# Patient Record
Sex: Female | Born: 1950 | Race: White | Hispanic: No | Smoking: Never smoker
Health system: Southern US, Community
[De-identification: ages and names within clinical notes are randomized; demographics above are authoritative.]

## PROBLEM LIST (undated history)

## (undated) DIAGNOSIS — M160 Bilateral primary osteoarthritis of hip: Secondary | ICD-10-CM

## (undated) DIAGNOSIS — T8859XA Other complications of anesthesia, initial encounter: Secondary | ICD-10-CM

## (undated) DIAGNOSIS — M51369 Other intervertebral disc degeneration, lumbar region without mention of lumbar back pain or lower extremity pain: Secondary | ICD-10-CM

## (undated) DIAGNOSIS — M5136 Other intervertebral disc degeneration, lumbar region: Secondary | ICD-10-CM

## (undated) DIAGNOSIS — F419 Anxiety disorder, unspecified: Secondary | ICD-10-CM

## (undated) DIAGNOSIS — D649 Anemia, unspecified: Secondary | ICD-10-CM

## (undated) DIAGNOSIS — G4730 Sleep apnea, unspecified: Secondary | ICD-10-CM

## (undated) DIAGNOSIS — M1990 Unspecified osteoarthritis, unspecified site: Secondary | ICD-10-CM

## (undated) DIAGNOSIS — T4145XA Adverse effect of unspecified anesthetic, initial encounter: Secondary | ICD-10-CM

## (undated) DIAGNOSIS — K219 Gastro-esophageal reflux disease without esophagitis: Secondary | ICD-10-CM

## (undated) DIAGNOSIS — T753XXA Motion sickness, initial encounter: Secondary | ICD-10-CM

## (undated) DIAGNOSIS — J45909 Unspecified asthma, uncomplicated: Secondary | ICD-10-CM

## (undated) DIAGNOSIS — I10 Essential (primary) hypertension: Secondary | ICD-10-CM

---
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
## 2011-04-24 HISTORY — PX: SHOULDER ARTHROSCOPY WITH ROTATOR CUFF REPAIR: SHX5685

## 2014-04-23 HISTORY — PX: COLONOSCOPY: SHX174

## 2017-04-23 DIAGNOSIS — M5136 Other intervertebral disc degeneration, lumbar region: Secondary | ICD-10-CM | POA: Insufficient documentation

## 2017-10-21 DIAGNOSIS — M160 Bilateral primary osteoarthritis of hip: Secondary | ICD-10-CM | POA: Insufficient documentation

## 2018-11-27 ENCOUNTER — Other Ambulatory Visit: Payer: Self-pay | Admitting: Orthopedic Surgery

## 2018-11-27 DIAGNOSIS — M25511 Pain in right shoulder: Secondary | ICD-10-CM

## 2018-12-10 ENCOUNTER — Other Ambulatory Visit: Payer: Self-pay

## 2018-12-10 ENCOUNTER — Ambulatory Visit
Admission: RE | Admit: 2018-12-10 | Discharge: 2018-12-10 | Disposition: A | Discharge status: Home / Self Care | Payer: Medicare HMO | Source: Ambulatory Visit | Attending: Orthopedic Surgery | Admitting: Orthopedic Surgery

## 2018-12-10 DIAGNOSIS — M25511 Pain in right shoulder: Secondary | ICD-10-CM | POA: Diagnosis not present

## 2018-12-10 IMAGING — MR MRI OF THE RIGHT SHOULDER WITHOUT CONTRAST
4 of 5 series · 31 of 40 positions shown · non-contrast
Comparison: None.

CLINICAL DATA: Right shoulder pain, limited range of motion

EXAM:
MRI OF THE RIGHT SHOULDER WITHOUT CONTRAST
TECHNIQUE: Multiplanar, multisequence MR imaging of the shoulder was performed.
No intravenous contrast was administered.

[Series 5: PD fat-sat · axial · right · 4.0mm · 0.55mm/px · z∈[-43,+87]mm · 8 of 28 slices shown (1 of 2)]
[im 1/28]
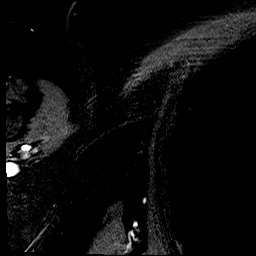
[im 4/28]
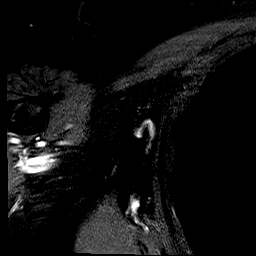
[im 10/28]
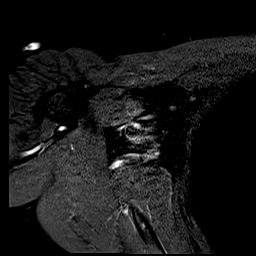
[im 13/28]
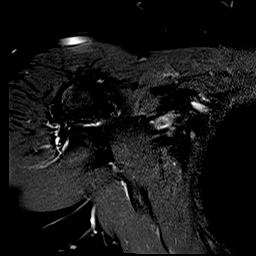
[im 16/28]
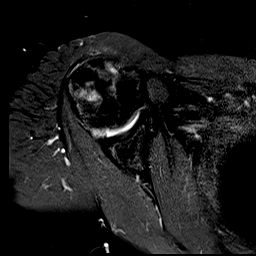
[im 19/28]
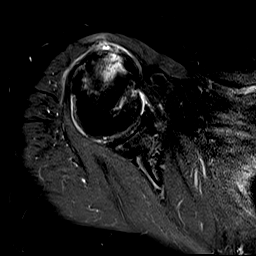
[im 25/28]
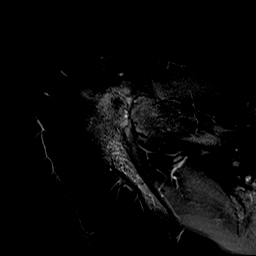
[im 28/28]
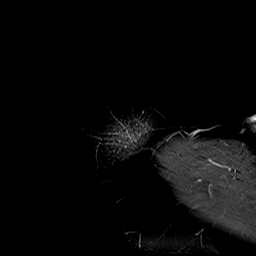

[Series 6: PD fat-sat · oblique · right · 4.0mm · 0.44mm/px · 8 of 26 slices shown (2 of 2)]
[im 1/26]
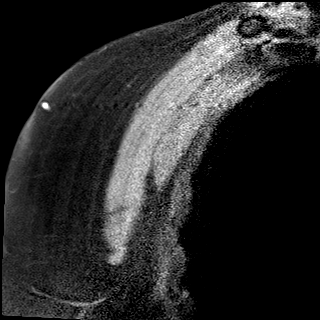
[im 4/26]
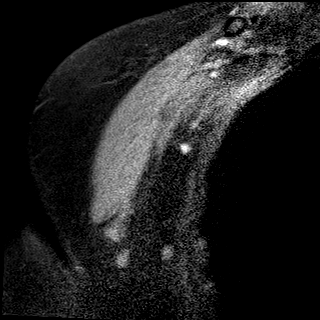
[im 8/26]
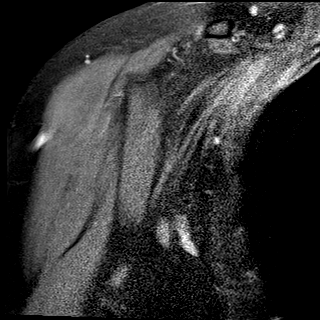
[im 11/26]
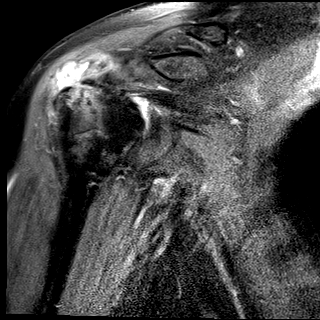
[im 15/26]
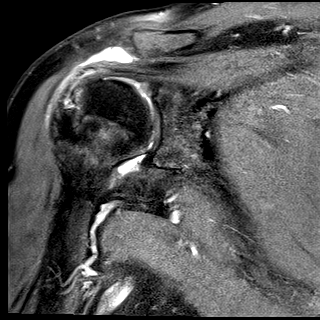
[im 18/26]
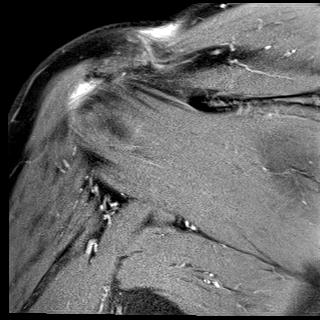
[im 22/26]
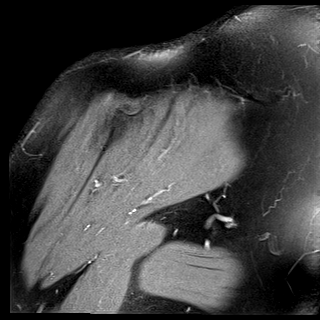
[im 26/26]
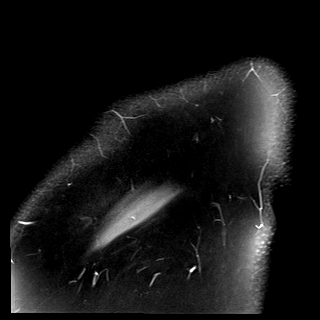

[Series 7: T2 fat-sat · oblique · right · 4.0mm · 0.44mm/px · 8 of 26 slices shown (1 of 2)]
[im 1/26]
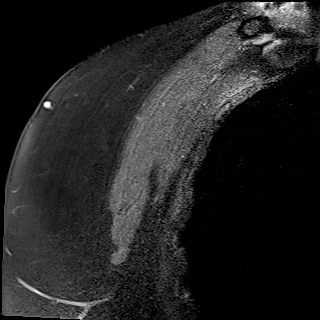
[im 4/26]
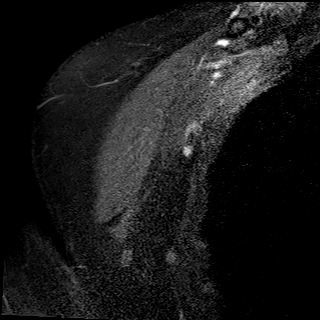
[im 8/26]
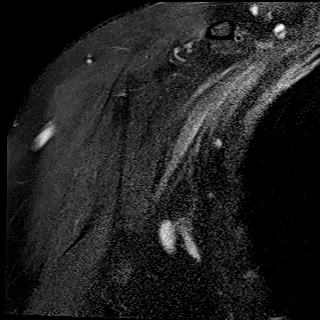
[im 11/26]
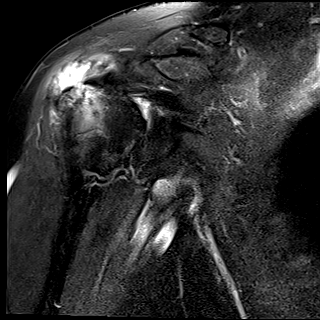
[im 15/26]
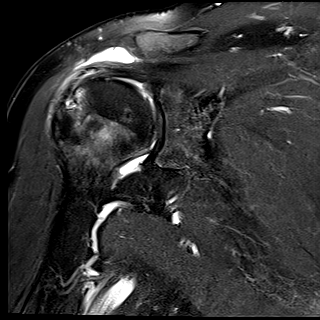
[im 18/26]
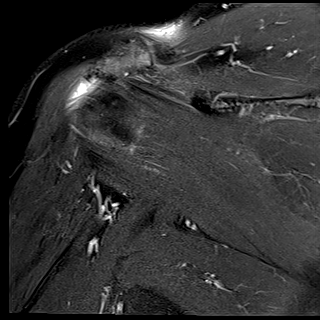
[im 22/26]
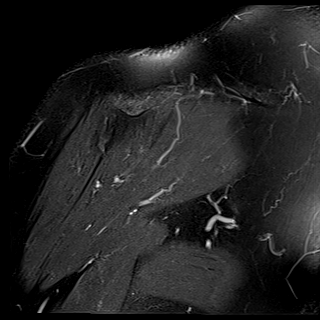
[im 26/26]
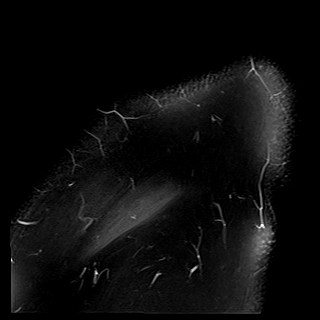

[Series 8: T2 fat-sat · oblique · right · 4.0mm · 0.23mm/px · 7 of 22 slices shown (2 of 2)]
[im 1/22]
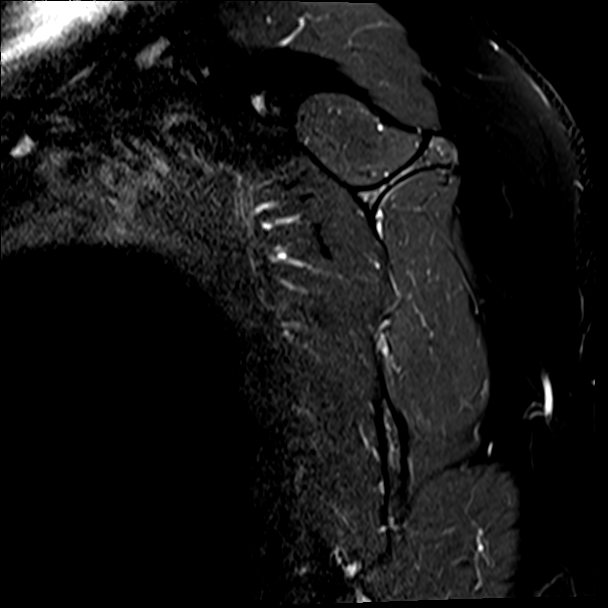
[im 4/22]
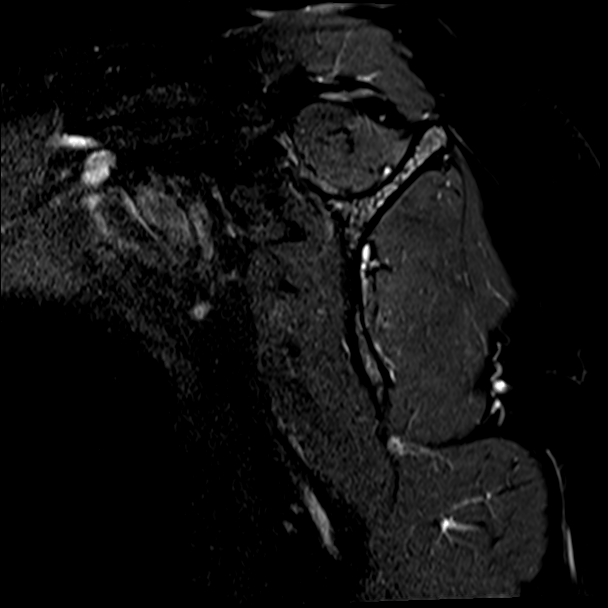
[im 8/22]
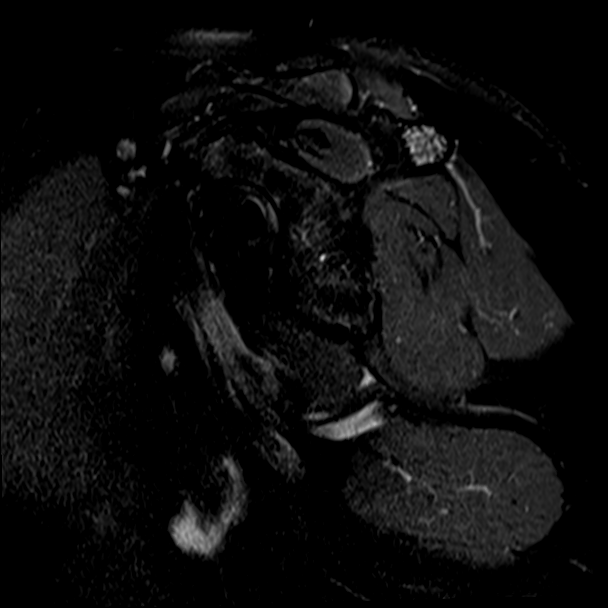
[im 11/22]
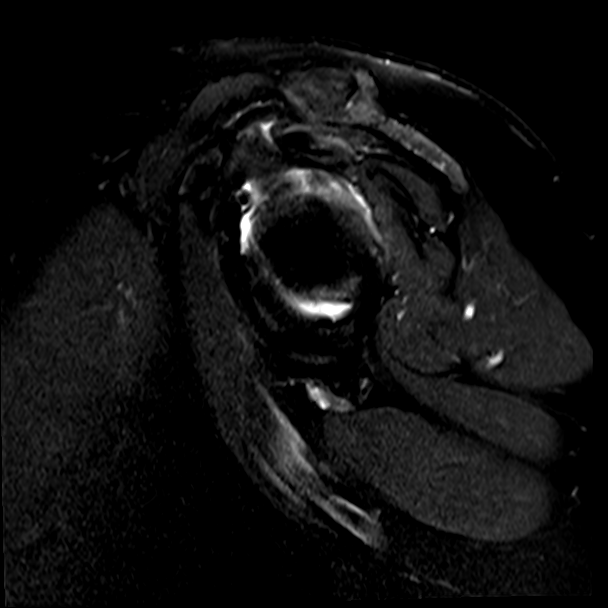
[im 15/22]
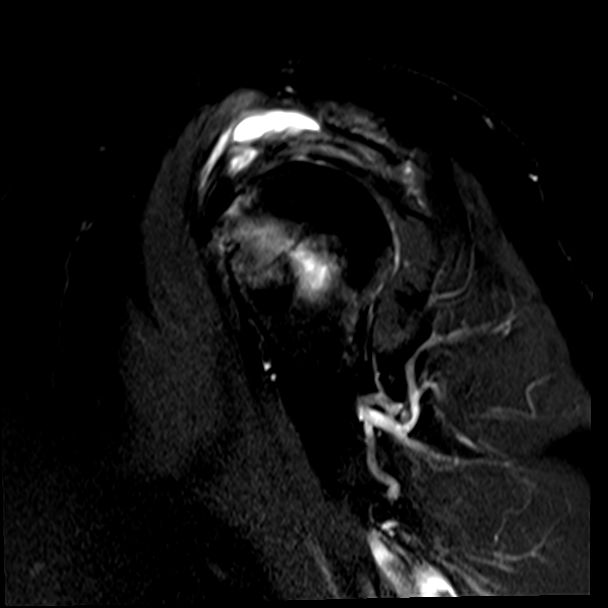
[im 18/22]
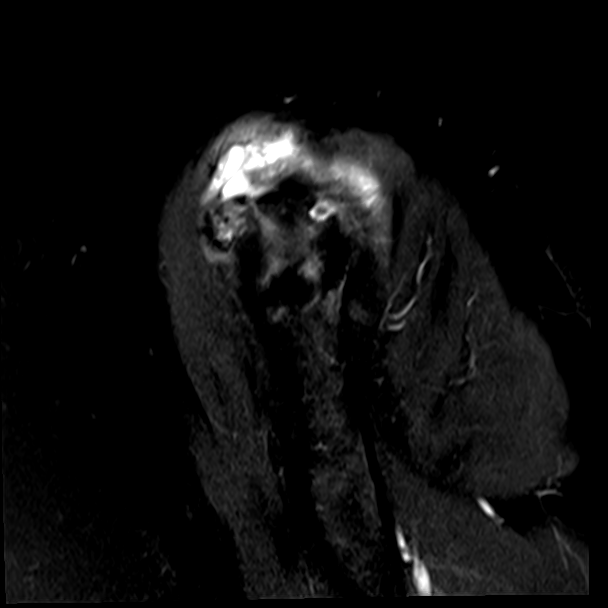
[im 22/22]
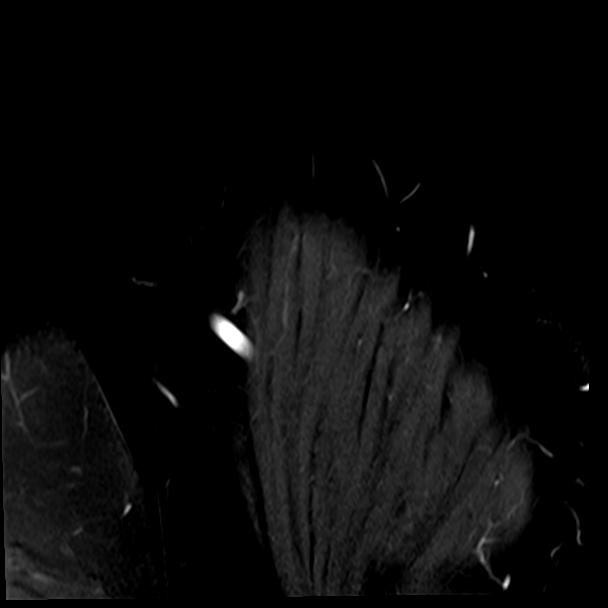

[31 of 40 positions shown; findings below may reference images not displayed]

FINDINGS: Rotator cuff: Full-thickness tear involving the anterior insertional
fibers of the supraspinatus tendon measuring approximately 6 mm in
AP dimension (series 7, image 16; series 8, image 18). There is a
background of supraspinatus tendinosis with additional low-grade
interstitial tears involving the more posterior fibers. Diffuse
infraspinatus tendinosis without discrete tear. Subscapularis
tendinosis with probable partial tearing of the more superior
fibers. Teres minor intact.

Muscles: No atrophy or abnormal signal of the muscles of the rotator
cuff.

Biceps long head: Not visualized, and may be chronically torn or
surgically released.

Acromioclavicular Joint: Mild arthropathy of the acromioclavicular
joint. Type II acromion. Small to moderate volume of fluid in the
subacromial-subdeltoid bursa which is heterogeneous and loculated
suggesting bursitis.

Glenohumeral Joint: No joint effusion. Small focus of low signal
intensity material is present at the axillary recess of the
glenohumeral joint, likely representing a loose chondral body.
Extensive cartilage thinning and surface irregularity with areas of
near full-thickness cartilage loss predominantly involving the
anteromedial aspect of the humeral head.

Labrum:  Circumferentially degenerated.

Bones: 2 surgical anchors are present in the greater tuberosity.
There is extensive marrow edema within the greater tuberosity.

Other: None.
IMPRESSION: 1. Rotator cuff tendinosis with full-thickness tear of the anterior
insertional fibers of the supraspinatus tendon.
2. Subacromial-subdeltoid bursitis.
3. Prominent bone marrow edema within the greater tuberosity at site
of prior tendon anchors. Findings may be reactive secondary to
rotator cuff tear and/or anchor loosening. A nondisplaced fracture
at this site would be difficult to exclude.
4. Moderate glenohumeral osteoarthritis with extensive humeral head
cartilage loss. Small intra-articular body in the glenohumeral joint
likely reflecting a loose chondral fragment.

## 2018-12-31 ENCOUNTER — Encounter: Payer: Self-pay | Admitting: *Deleted

## 2018-12-31 ENCOUNTER — Other Ambulatory Visit: Payer: Self-pay

## 2019-01-05 ENCOUNTER — Other Ambulatory Visit: Admission: RE | Admit: 2019-01-05 | Payer: Medicare HMO | Source: Ambulatory Visit

## 2019-01-06 ENCOUNTER — Other Ambulatory Visit: Payer: Self-pay

## 2019-01-06 ENCOUNTER — Other Ambulatory Visit
Admission: RE | Admit: 2019-01-06 | Discharge: 2019-01-06 | Disposition: A | Discharge status: Home / Self Care | Payer: Medicare HMO | Source: Ambulatory Visit | Attending: Orthopedic Surgery | Admitting: Orthopedic Surgery

## 2019-01-06 DIAGNOSIS — Z20828 Contact with and (suspected) exposure to other viral communicable diseases: Secondary | ICD-10-CM | POA: Insufficient documentation

## 2019-01-06 DIAGNOSIS — Z01812 Encounter for preprocedural laboratory examination: Secondary | ICD-10-CM | POA: Insufficient documentation

## 2019-01-06 LAB — SARS CORONAVIRUS 2 (TAT 6-24 HRS): SARS Coronavirus 2: NEGATIVE

## 2019-01-09 ENCOUNTER — Ambulatory Visit
Admission: RE | Admit: 2019-01-09 | Discharge: 2019-01-09 | Disposition: A | Discharge status: Home / Self Care | Payer: Medicare HMO | Attending: Orthopedic Surgery | Admitting: Orthopedic Surgery

## 2019-01-09 ENCOUNTER — Ambulatory Visit: Payer: Medicare HMO | Admitting: Anesthesiology

## 2019-01-09 ENCOUNTER — Encounter
Admission: RE | Disposition: A | Discharge status: Home / Self Care | Payer: Self-pay | Source: Home / Self Care | Attending: Orthopedic Surgery

## 2019-01-09 ENCOUNTER — Other Ambulatory Visit: Payer: Self-pay

## 2019-01-09 DIAGNOSIS — K219 Gastro-esophageal reflux disease without esophagitis: Secondary | ICD-10-CM | POA: Insufficient documentation

## 2019-01-09 DIAGNOSIS — Z8249 Family history of ischemic heart disease and other diseases of the circulatory system: Secondary | ICD-10-CM | POA: Diagnosis not present

## 2019-01-09 DIAGNOSIS — Z86010 Personal history of colonic polyps: Secondary | ICD-10-CM | POA: Diagnosis not present

## 2019-01-09 DIAGNOSIS — I10 Essential (primary) hypertension: Secondary | ICD-10-CM | POA: Insufficient documentation

## 2019-01-09 DIAGNOSIS — E669 Obesity, unspecified: Secondary | ICD-10-CM | POA: Diagnosis not present

## 2019-01-09 DIAGNOSIS — J4520 Mild intermittent asthma, uncomplicated: Secondary | ICD-10-CM | POA: Insufficient documentation

## 2019-01-09 DIAGNOSIS — Z8261 Family history of arthritis: Secondary | ICD-10-CM | POA: Diagnosis not present

## 2019-01-09 DIAGNOSIS — M7541 Impingement syndrome of right shoulder: Secondary | ICD-10-CM | POA: Insufficient documentation

## 2019-01-09 DIAGNOSIS — G4730 Sleep apnea, unspecified: Secondary | ICD-10-CM | POA: Insufficient documentation

## 2019-01-09 DIAGNOSIS — Z6832 Body mass index (BMI) 32.0-32.9, adult: Secondary | ICD-10-CM | POA: Diagnosis not present

## 2019-01-09 DIAGNOSIS — M160 Bilateral primary osteoarthritis of hip: Secondary | ICD-10-CM | POA: Insufficient documentation

## 2019-01-09 DIAGNOSIS — G5702 Lesion of sciatic nerve, left lower limb: Secondary | ICD-10-CM | POA: Insufficient documentation

## 2019-01-09 DIAGNOSIS — Z79899 Other long term (current) drug therapy: Secondary | ICD-10-CM | POA: Insufficient documentation

## 2019-01-09 DIAGNOSIS — Z833 Family history of diabetes mellitus: Secondary | ICD-10-CM | POA: Diagnosis not present

## 2019-01-09 DIAGNOSIS — M75101 Unspecified rotator cuff tear or rupture of right shoulder, not specified as traumatic: Secondary | ICD-10-CM | POA: Diagnosis not present

## 2019-01-09 DIAGNOSIS — Z803 Family history of malignant neoplasm of breast: Secondary | ICD-10-CM | POA: Insufficient documentation

## 2019-01-09 DIAGNOSIS — Z8349 Family history of other endocrine, nutritional and metabolic diseases: Secondary | ICD-10-CM | POA: Diagnosis not present

## 2019-01-09 HISTORY — DX: Sleep apnea, unspecified: G47.30

## 2019-01-09 HISTORY — DX: Motion sickness, initial encounter: T75.3XXA

## 2019-01-09 HISTORY — PX: SHOULDER ARTHROSCOPY WITH SUBACROMIAL DECOMPRESSION: SHX5684

## 2019-01-09 HISTORY — DX: Other complications of anesthesia, initial encounter: T88.59XA

## 2019-01-09 HISTORY — DX: Other intervertebral disc degeneration, lumbar region without mention of lumbar back pain or lower extremity pain: M51.369

## 2019-01-09 HISTORY — DX: Gastro-esophageal reflux disease without esophagitis: K21.9

## 2019-01-09 HISTORY — DX: Unspecified osteoarthritis, unspecified site: M19.90

## 2019-01-09 HISTORY — DX: Other intervertebral disc degeneration, lumbar region: M51.36

## 2019-01-09 HISTORY — DX: Essential (primary) hypertension: I10

## 2019-01-09 SURGERY — SHOULDER ARTHROSCOPY WITH SUBACROMIAL DECOMPRESSION AND DISTAL CLAVICLE EXCISION
Anesthesia: Regional | Site: Shoulder | Laterality: Right

## 2019-01-09 MED ORDER — FENTANYL CITRATE (PF) 100 MCG/2ML IJ SOLN
INTRAMUSCULAR | Status: DC | PRN
  Administered 2019-01-09: 50 ug via INTRAVENOUS

## 2019-01-09 MED ORDER — DEXTROSE 5 % IV SOLN
2000.0000 mg | Freq: Once | INTRAVENOUS | Status: AC
  Administered 2019-01-09: 08:00:00 2 g via INTRAVENOUS

## 2019-01-09 MED ORDER — MIDAZOLAM HCL 5 MG/5ML IJ SOLN
INTRAMUSCULAR | Status: DC | PRN
  Administered 2019-01-09: 2 mg via INTRAVENOUS

## 2019-01-09 MED ORDER — ASPIRIN EC 325 MG PO TBEC: 325.0000 mg | DELAYED_RELEASE_TABLET | Freq: Every day | ORAL | 0 refills | Status: AC

## 2019-01-09 MED ORDER — ONDANSETRON 4 MG PO TBDP: 4.0000 mg | ORAL_TABLET | Freq: Three times a day (TID) | ORAL | 0 refills | Status: DC | PRN

## 2019-01-09 MED ORDER — OXYCODONE HCL 5 MG PO TABS: 5.0000 mg | ORAL_TABLET | ORAL | 0 refills | Status: DC | PRN

## 2019-01-09 MED ORDER — GLYCOPYRROLATE 0.2 MG/ML IJ SOLN
INTRAMUSCULAR | Status: DC | PRN
  Administered 2019-01-09: 0.1 mg via INTRAVENOUS

## 2019-01-09 MED ORDER — EPHEDRINE SULFATE 50 MG/ML IJ SOLN
INTRAMUSCULAR | Status: DC | PRN
  Administered 2019-01-09: 10 mg via INTRAVENOUS

## 2019-01-09 MED ORDER — ACETAMINOPHEN 500 MG PO TABS: 1000.0000 mg | ORAL_TABLET | Freq: Three times a day (TID) | ORAL | 2 refills | Status: AC

## 2019-01-09 MED ORDER — PROPOFOL 10 MG/ML IV BOLUS
INTRAVENOUS | Status: DC | PRN
  Administered 2019-01-09: 130 mg via INTRAVENOUS

## 2019-01-09 MED ORDER — BUPIVACAINE LIPOSOME 1.3 % IJ SUSP
INTRAMUSCULAR | Status: DC | PRN
  Administered 2019-01-09: 20 mL via PERINEURAL

## 2019-01-09 MED ORDER — DEXAMETHASONE SODIUM PHOSPHATE 4 MG/ML IJ SOLN
INTRAMUSCULAR | Status: DC | PRN
  Administered 2019-01-09: 4 mg via INTRAVENOUS

## 2019-01-09 MED ORDER — LACTATED RINGERS IV SOLN
INTRAVENOUS | Status: DC | PRN
  Administered 2019-01-09: 4 mL

## 2019-01-09 MED ORDER — LACTATED RINGERS IV SOLN
INTRAVENOUS | Status: DC
  Administered 2019-01-09: 07:00:00 via INTRAVENOUS

## 2019-01-09 MED ORDER — LIDOCAINE HCL (CARDIAC) PF 100 MG/5ML IV SOSY
PREFILLED_SYRINGE | INTRAVENOUS | Status: DC | PRN
  Administered 2019-01-09: 40 mg via INTRATRACHEAL

## 2019-01-09 MED ORDER — BUPIVACAINE HCL (PF) 0.5 % IJ SOLN
INTRAMUSCULAR | Status: DC | PRN
  Administered 2019-01-09: 20 mL

## 2019-01-09 SURGICAL SUPPLY — 58 items
ADAPTER IRRIG TUBE 2 SPIKE SOL (ADAPTER) ×4 IMPLANT
ANCHOR BONE REGENETEN (Anchor) ×1 IMPLANT
ANCHOR HEALICOIL REGEN 5.5 (Anchor) ×2 IMPLANT
ANCHOR SUT 4.75 HEALICOIL REGE (Anchor) ×2 IMPLANT
ANCHOR TENDON REGENETEN (Staple) ×1 IMPLANT
BUR BR 5.5 12 FLUTE (BURR) ×2 IMPLANT
BUR RADIUS 4.0X18.5 (BURR) ×2 IMPLANT
CANNULA PART THRD DISP 5.75X7 (CANNULA) ×2 IMPLANT
CHLORAPREP W/TINT 26 (MISCELLANEOUS) ×2 IMPLANT
COOLER POLAR GLACIER W/PUMP (MISCELLANEOUS) ×2 IMPLANT
COVER LIGHT HANDLE UNIVERSAL (MISCELLANEOUS) ×4 IMPLANT
DERMABOND ADVANCED (GAUZE/BANDAGES/DRESSINGS) ×1
DERMABOND ADVANCED .7 DNX12 (GAUZE/BANDAGES/DRESSINGS) ×1 IMPLANT
DRAPE IMP U-DRAPE 54X76 (DRAPES) ×4 IMPLANT
DRAPE INCISE IOBAN 66X45 STRL (DRAPES) ×2 IMPLANT
DRAPE SHEET LG 3/4 BI-LAMINATE (DRAPES) ×2 IMPLANT
DRAPE U-SHAPE 48X52 POLY STRL (PACKS) ×4 IMPLANT
DRSG TEGADERM 4X4.75 (GAUZE/BANDAGES/DRESSINGS) ×6 IMPLANT
ELECT REM PT RETURN 9FT ADLT (ELECTROSURGICAL) ×2
ELECTRODE REM PT RTRN 9FT ADLT (ELECTROSURGICAL) ×1 IMPLANT
GAUZE XEROFORM 1X8 LF (GAUZE/BANDAGES/DRESSINGS) ×3 IMPLANT
GLOVE BIOGEL PI IND STRL 8 (GLOVE) ×1 IMPLANT
GLOVE BIOGEL PI INDICATOR 8 (GLOVE) ×3
GLOVE PI ULTRA LF STRL 7.5 (GLOVE) IMPLANT
GLOVE PI ULTRA NON LATEX 7.5 (GLOVE) ×3
GOWN STRL REIN 2XL XLG LVL4 (GOWN DISPOSABLE) ×2 IMPLANT
GOWN STRL REUS W/ TWL LRG LVL3 (GOWN DISPOSABLE) ×1 IMPLANT
GOWN STRL REUS W/TWL LRG LVL3 (GOWN DISPOSABLE) ×2
IMPL REGENETEN MEDIUM (Shoulder) IMPLANT
IMPLANT REGENETEN MEDIUM (Shoulder) ×2 IMPLANT
IV LACTATED RINGER IRRG 3000ML (IV SOLUTION) ×4
IV LR IRRIG 3000ML ARTHROMATIC (IV SOLUTION) ×8 IMPLANT
KIT STABILIZATION SHOULDER (MISCELLANEOUS) ×2 IMPLANT
KIT TURNOVER KIT A (KITS) ×2 IMPLANT
MANIFOLD NEPTUNE II (INSTRUMENTS) ×2 IMPLANT
MASK FACE SPIDER DISP (MASK) ×2 IMPLANT
MAT GRAY ABSORB FLUID 28X50 (MISCELLANEOUS) ×4 IMPLANT
NDL SAFETY ECLIPSE 18X1.5 (NEEDLE) ×1 IMPLANT
NDL SCORPION MULTI FIRE (NEEDLE) IMPLANT
NEEDLE HYPO 18GX1.5 SHARP (NEEDLE) ×1
NEEDLE SCORPION MULTI FIRE (NEEDLE) ×2 IMPLANT
PACK ARTHROSCOPY SHOULDER (MISCELLANEOUS) ×2 IMPLANT
PAD WRAPON POLAR SHDR UNIV (MISCELLANEOUS) ×1 IMPLANT
PENCIL SMOKE EVACUATOR (MISCELLANEOUS) ×1 IMPLANT
SET TUBE SUCT SHAVER OUTFL 24K (TUBING) ×2 IMPLANT
SET TUBE TIP INTRA-ARTICULAR (MISCELLANEOUS) ×2 IMPLANT
SPONGE GAUZE 2X2 8PLY STRL LF (GAUZE/BANDAGES/DRESSINGS) ×9 IMPLANT
SUT ETHILON 3-0 FS-10 30 BLK (SUTURE) ×2
SUT MNCRL 4-0 (SUTURE) ×1
SUT MNCRL 4-0 27XMFL (SUTURE) ×1
SUT VIC AB 0 CT1 36 (SUTURE) ×2 IMPLANT
SUT VIC AB 2-0 CT2 27 (SUTURE) ×2 IMPLANT
SUTURE EHLN 3-0 FS-10 30 BLK (SUTURE) ×1 IMPLANT
SUTURE MNCRL 4-0 27XMF (SUTURE) ×1 IMPLANT
SYR 10ML LL (SYRINGE) ×2 IMPLANT
TUBING ARTHRO INFLOW-ONLY STRL (TUBING) ×2 IMPLANT
WAND WEREWOLF FLOW 90D (MISCELLANEOUS) ×2 IMPLANT
WRAPON POLAR PAD SHDR UNIV (MISCELLANEOUS) ×2

## 2019-01-09 NOTE — Op Note (Signed)
SURGERY DATE: 01/09/2019  PRE-OP DIAGNOSIS:  1. Right subacromial impingement 2. Right rotator cuff tear  POST-OP DIAGNOSIS: 1. Right subacromial impingement 2. Right rotator cuff tear  PROCEDURES:  1. Right mini-open rotator cuff repair 2. Right arthroscopic extensive debridement of shoulder (glenohumeral and subacromial spaces) 3. Right arthroscopic subacromial decompression  SURGEON: Rosealee AlbeeSunny H. Devika Dragovich, MD  ASSISTANT: Sonny DandyJ. Todd Mundy, PA  ANESTHESIA: Gen with Exparil interscalene block  ESTIMATED BLOOD LOSS: 25cc  DRAINS:  none  TOTAL IV FLUIDS: per anesthesia   SPECIMENS: none  IMPLANTS:  -[employer] Healicoil anchors double loaded with tape and suture x 2  -[employer] Healicoil Knotless Lateral Row Anchor x 2 -[employer] Regeneten Patch with associated tendon and bone staples   OPERATIVE FINDINGS:  Examination under anesthesia: A careful examination under anesthesia was performed.  Passive range of motion was: FF: 150; ER at side: 45; ER in abduction: 90; IR in abduction: 50.  Anterior load shift: NT.  Posterior load shift: NT.  Sulcus in neutral: NT.  Sulcus in ER: NT.    Intra-operative findings: A thorough arthroscopic examination of the shoulder was performed.  The findings are: 1. Biceps tendon: Not visualized within the joint 2. Superior labrum: injected with surrounding synovitis 3. Posterior labrum and capsule: normal 4. Inferior capsule and inferior recess: normal, no loose body noted in this region or anywhere else about the shoulder joint 5. Glenoid cartilage surface: Focal areas of grade 2 degenerative changes centrally and anteriorly 6. Supraspinatus attachment: full-thickness tear anteriorly with high-grade partial-thickness tear surrounding this region 7. Posterior rotator cuff attachment: normal 8. Humeral head articular cartilage: Focal areas of grade 4 degenerative changes with diffuse grade 1-2 degenerative changes 9. Rotator interval:  significant synovitis 10: Subscapularis tendon: attachment intact 11. Anterior labrum: degenerative 12. IGHL: normal  OPERATIVE REPORT:   Indications for procedure: Sheila MoronJoanne Hallisey is a 68 y.o. female with approximately 3 months of right shoulder pain that began after she pushed herself out of the bed.  Prior to this event she did not have any significant pain in her shoulder.  Of note, she has had a prior mini open rotator cuff surgery approximately 10 years ago.  She had done well from that surgery since that time until recently. Clinical exam and MRI were suggestive of rotator cuff tear and subacromial impingement. After discussion of risks, benefits, and alternatives to surgery, the patient elected to proceed.  Given the full-thickness nature of the tear and traumatic event with no antecedent shoulder pain, we elected to proceed with surgical management directly prior to further conservative measures as the patient would likely have a better result with surgery.  Procedure in detail:  I identified Sheila Rivas in the pre-operative holding area.  I marked the operative shoulder with my initials. I reviewed the risks and benefits of the proposed surgical intervention, and the patient (and/or patient's guardian) wished to proceed.  Anesthesia was then performed with an Exparil interscalene block.  The patient was transferred to the operative suite and placed in the beach chair position.    SCDs were placed on the lower extremities. Appropriate IV antibiotics were administered prior to incision. The operative upper extremity was then prepped and draped in standard fashion. A time out was performed confirming the correct extremity, correct patient, and correct procedure.   I then created a standard posterior portal with an 11 blade. The glenohumeral joint was easily entered with a blunt trochar and the arthroscope introduced. The findings  of diagnostic arthroscopy are described above. I debrided  degenerative tissue including the synovitic tissue about the rotator interval and anterior and superior labrum. I then coagulated the inflamed synovium to obtain hemostasis and reduce the risk of post-operative swelling using an Arthrocare radiofrequency device.  There is no notable loose body within the shoulder joint.  Next, the arthroscope was then introduced into the subacromial space. A direct lateral portal was created with an 11-blade after spinal needle localization. An extensive subacromial bursectomy was performed using a combination of the shaver and Arthrocare wand. The entire acromial undersurface was exposed and the CA ligament was subperiosteally elevated to expose the anterior acromial hook. A 5.87mm barrel burr was used to create a flat anterior and lateral aspect of the acromion, converting it from a Type 2 to a Type 1 acromion. Care was made to keep the deltoid fascia intact.  The previously utilized mini open incision was utilized to approach the rotator cuff repair.  A longitudinal incision from the anterolateral acromion ~7cm in length was made overlying the raphe between the anterior and middle heads of the deltoid. The raphe was identified and it was incised. The subacromial space was identified.  There was significant scar tissue with adhesions between the bursa and the deltoid anteriorly.  These were removed.  Any remaining bursa was excised. The rotator cuff tear was identified. It was a U-shaped tear along the anterior supraspinatus.  There was significant surrounding areas of high-grade partial-thickness tear of the bursal side.  Given the appearance of this degenerative tissue, a 15 blade was used to excise the areas of degenerative partial-thickness tearing and the tear was completed such that it was a full-thickness U-shaped tear.  The rotator cuff footprint was cleared of soft tissue. A rongeur was used to gently decorticate the rotator cuff footprint to allow for improved  healing. Two Healicoil anchors were placed just lateral to the articular margin.  The rotator cuff was mobilized using key elevators both superior and inferior to the tear.  There were significant adhesions superiorly that were released.  The rotator cuff was able to be almost completely reduced to its footprint and then held in a reduced position with graspers. All 8 strands of suture from the medial row anchors were passed through the rotator cuff in an appropriate fashion. Two HealiCoil knotless anchors were placed for the lateral row anchors with the anterior sutures passed through the posterior lateral row anchor and the posterior sutures passed through the anterior lateral row anchor.  This allowed for the best reapproximation of the rotator cuff over its footprint, but there was a small region of the anterolateral footprint that was left uncovered.  Therefore we decided to proceed with Regeneten patch application.  The patch was deployed over this area and appropriate tendon and bone staples were used to secure the implant. This construct was stable with external and internal rotation.  The wound was thoroughly irrigated.  The deltoid split was closed with 0 Vicryl.  The subdermal layer was closed with 2-0 Vicryl.  The skin was closed with 4-0 Monocryl and Dermabond. The portals were closed with 3-0 Nylon. Xeroform was applied to the incisions. A sterile dressing was applied, followed by a Polar Care sleeve and a SlingShot shoulder immobilizer/sling. The patient was awakened from anesthesia without difficulty and was transferred to the PACU in stable condition.   Of note, assistance from a PA was essential to performing the surgery.  PA was present for the entire surgery.  PA assisted with patient positioning, retraction, instrumentation, and wound closure. The surgery would have been more difficult and had longer operative time without PA assistance.   Additionally, this case had increased complexity  compared to standard rotator cuff repair given the revision nature of the surgery.  There was increased adhesions and scar tissue that required additional dissection.  Additionally given that the tear did not completely cover the footprint Regeneten patch was applied to augment the repair.  These factors increased surgical time by approximately 30 minutes.  COMPLICATIONS: none  DISPOSITION: plan for discharge home after recovery in PACU   POSTOPERATIVE PLAN: Remain in sling (except hygiene and elbow/wrist/hand RoM exercises as instructed by PT) x 6 weeks and NWB for this time. PT to begin 3-4 days after surgery. Rotator cuff repair rehab protocol. ASA 325mg  daily x 2 weeks for DVT ppx.

## 2019-01-09 NOTE — Discharge Instructions (Signed)
Post-Op Instructions - Rotator Cuff Repair ° °1. Bracing: You will wear a shoulder immobilizer or sling for 6 weeks.  ° °2. Driving: No driving for 3 weeks post-op. When driving, do not wear the immobilizer. Ideally, we recommend no driving for 6 weeks while sling is in place as one arm will be immobilized.  ° °3. Activity: No active lifting for 2 months. Wrist, hand, and elbow motion only. Avoid lifting the upper arm away from the body except for hygiene. You are permitted to bend and straighten the elbow passively only (no active elbow motion). You may use your hand and wrist for typing, writing, and managing utensils (cutting food). Do not lift more than a coffee cup for 8 weeks.  When sleeping or resting, inclined positions (recliner chair or wedge pillow) and a pillow under the forearm for support may provide better comfort for up to 4 weeks.  Avoid long distance travel for 4 weeks. ° °Return to normal activities after rotator cuff repair repair normally takes 6 months on average. If rehab goes very well, may be able to do most activities at 4 months, except overhead or contact sports. ° °4. Physical Therapy: Begins 3-4 days after surgery, and proceed 1 time per week for the first 6 weeks, then 1-2 times per week from weeks 6-20 post-op. ° °5. Medications:  °- You will be provided a prescription for narcotic pain medicine. After surgery, take 1-2 narcotic tablets every 4 hours if needed for severe pain.  °- A prescription for anti-nausea medication will be provided in case the narcotic medicine causes nausea - take 1 tablet every 6 hours only if nauseated.   °- Take tylenol 1000 mg (2 Extra Strength tablets or 3 regular strength) every 8 hours for pain.  May decrease or stop tylenol 5 days after surgery if you are having minimal pain. °- Take ASA 325mg/day x 2 weeks to help prevent DVTs/PEs (blood clots).  °- DO NOT take ANY nonsteroidal anti-inflammatory pain medications (Advil, Motrin, Ibuprofen, Aleve,  Naproxen, or Naprosyn). These medicines can inhibit healing of your shoulder repair.  ° ° °If you are taking prescription medication for anxiety, depression, insomnia, muscle spasm, chronic pain, or for attention deficit disorder, you are advised that you are at a higher risk of adverse effects with use of narcotics post-op, including narcotic addiction/dependence, depressed breathing, death. °If you use non-prescribed substances: alcohol, marijuana, cocaine, heroin, methamphetamines, etc., you are at a higher risk of adverse effects with use of narcotics post-op, including narcotic addiction/dependence, depressed breathing, death. °You are advised that taking > 50 morphine milligram equivalents (MME) of narcotic pain medication per day results in twice the risk of overdose or death. For your prescription provided: oxycodone 5 mg - taking more than 6 tablets per day would result in > 50 morphine milligram equivalents (MME) of narcotic pain medication. °Be advised that we will prescribe narcotics short-term, for acute post-operative pain only - 3 weeks for major operations such as shoulder repair/reconstruction surgeries.  ° ° ° °6. Post-Op Appointment: ° °Your first post-op appointment will be 10-14 days post-op. ° °7. Work or School: For most, but not all procedures, we advise staying out of work or school for at least 1 to 2 weeks in order to recover from the stress of surgery and to allow time for healing.  ° °If you need a work or school note this can be provided.  ° °8. Smoking: If you are a smoker, you need to refrain from   smoking in the postoperative period. The nicotine in cigarettes will inhibit healing of your shoulder repair and decrease the chance of successful repair. Similarly, nicotine containing products (gum, patches) should be avoided.  ° °Post-operative Brace: °Apply and remove the brace you received as you were instructed to at the time of fitting and as described in detail as the brace’s  instructions for use indicate.  Wear the brace for the period of time prescribed by your physician.  The brace can be cleaned with soap and water and allowed to air dry only.  Should the brace result in increased pain, decreased feeling (numbness/tingling), increased swelling or an overall worsening of your medical condition, please contact your doctor immediately.  If an emergency situation occurs as a result of wearing the brace after normal business hours, please dial 911 and seek immediate medical attention.  Let your doctor know if you have any further questions about the brace issued to you. °Refer to the shoulder sling instructions for use if you have any questions regarding the correct fit of your shoulder sling.  °[employer] Customer Care for Troubleshooting: [phone] ° °Video that illustrates how to properly use a shoulder sling: °"Instructions for Proper Use of an Orthopaedic Sling" °https://www.youtube.com/watch?v=AHZpn_Xo45w ° ° °Information for Discharge Teaching: °EXPAREL (bupivacaine liposome injectable suspension)  ° °Your surgeon or anesthesiologist gave you EXPAREL(bupivacaine) to help control your pain after surgery.  °· EXPAREL is a local anesthetic that provides pain relief by numbing the tissue around the surgical site. °· EXPAREL is designed to release pain medication over time and can control pain for up to 72 hours. °· Depending on how you respond to EXPAREL, you may require less pain medication during your recovery. ° °Possible side effects: °· Temporary loss of sensation or ability to move in the area where bupivacaine was injected. °· Nausea, vomiting, constipation °· Rarely, numbness and tingling in your mouth or lips, lightheadedness, or anxiety may occur. °· Call your doctor right away if you think you may be experiencing any of these sensations, or if you have other questions regarding possible side effects. ° °Follow all other discharge instructions given to you by your surgeon or  nurse. Eat a healthy diet and drink plenty of water or other fluids. ° °If you return to the hospital for any reason within 96 hours following the administration of EXPAREL, it is important for health care providers to know that you have received this anesthetic. A teal colored band has been placed on your arm with the date, time and amount of EXPAREL you have received in order to alert and inform your health care providers. Please leave this armband in place for the full 96 hours following administration, and then you may remove the band. ° ° ° °General Anesthesia, Adult, Care After °This sheet gives you information about how to care for yourself after your procedure. Your health care provider may also give you more specific instructions. If you have problems or questions, contact your health care provider. °What can I expect after the procedure? °After the procedure, the following side effects are common: °· Pain or discomfort at the IV site. °· Nausea. °· Vomiting. °· Sore throat. °· Trouble concentrating. °· Feeling cold or chills. °· Weak or tired. °· Sleepiness and fatigue. °· Soreness and body aches. These side effects can affect parts of the body that were not involved in surgery. °Follow these instructions at home: ° °For at least 24 hours after the procedure: °· Have a responsible adult   stay with you. It is important to have someone help care for you until you are awake and alert. °· Rest as needed. °· Do not: °? Participate in activities in which you could fall or become injured. °? Drive. °? Use heavy machinery. °? Drink alcohol. °? Take sleeping pills or medicines that cause drowsiness. °? Make important decisions or sign legal documents. °? Take care of children on your own. °Eating and drinking °· Follow any instructions from your health care provider about eating or drinking restrictions. °· When you feel hungry, start by eating small amounts of foods that are soft and easy to digest (bland), such as  toast. Gradually return to your regular diet. °· Drink enough fluid to keep your urine pale yellow. °· If you vomit, rehydrate by drinking water, juice, or clear broth. °General instructions °· If you have sleep apnea, surgery and certain medicines can increase your risk for breathing problems. Follow instructions from your health care provider about wearing your sleep device: °? Anytime you are sleeping, including during daytime naps. °? While taking prescription pain medicines, sleeping medicines, or medicines that make you drowsy. °· Return to your normal activities as told by your health care provider. Ask your health care provider what activities are safe for you. °· Take over-the-counter and prescription medicines only as told by your health care provider. °· If you smoke, do not smoke without supervision. °· Keep all follow-up visits as told by your health care provider. This is important. °Contact a health care provider if: °· You have nausea or vomiting that does not get better with medicine. °· You cannot eat or drink without vomiting. °· You have pain that does not get better with medicine. °· You are unable to pass urine. °· You develop a skin rash. °· You have a fever. °· You have redness around your IV site that gets worse. °Get help right away if: °· You have difficulty breathing. °· You have chest pain. °· You have blood in your urine or stool, or you vomit blood. °Summary °· After the procedure, it is common to have a sore throat or nausea. It is also common to feel tired. °· Have a responsible adult stay with you for the first 24 hours after general anesthesia. It is important to have someone help care for you until you are awake and alert. °· When you feel hungry, start by eating small amounts of foods that are soft and easy to digest (bland), such as toast. Gradually return to your regular diet. °· Drink enough fluid to keep your urine pale yellow. °· Return to your normal activities as told by  your health care provider. Ask your health care provider what activities are safe for you. °This information is not intended to replace advice given to you by your health care provider. Make sure you discuss any questions you have with your health care provider. °Document Released: 07/16/2000 Document Revised: 04/12/2017 Document Reviewed: 11/23/2016 °[employer]vier Patient Education © 2020 [employer] ° °

## 2019-01-09 NOTE — Transfer of Care (Signed)
Immediate Anesthesia Transfer of Care Note  Patient: Sheila Rivas  Procedure(s) Performed: Right shoulder arthroscopic subscapularis repair, mini-open rotator cuff repair, distal clavicleexcision, subacromial decompression, and loose bocy removal mac/reg w/interscalene block w/exparell (Right Shoulder)  Patient Location: PACU  Anesthesia Type: General, Regional  Level of Consciousness: awake, alert  and patient cooperative  Airway and Oxygen Therapy: Patient Spontanous Breathing and Patient connected to supplemental oxygen  Post-op Assessment: Post-op Vital signs reviewed, Patient's Cardiovascular Status Stable, Respiratory Function Stable, Patent Airway and No signs of Nausea or vomiting  Post-op Vital Signs: Reviewed and stable  Complications: No apparent anesthesia complications

## 2019-01-09 NOTE — Anesthesia Procedure Notes (Signed)
Procedure Name: LMA Insertion Date/Time: 01/09/2019 7:40 AM Performed by: Mayme Genta, CRNA Pre-anesthesia Checklist: Patient identified, Emergency Drugs available, Suction available, Timeout performed and Patient being monitored Patient Re-evaluated:Patient Re-evaluated prior to induction Oxygen Delivery Method: Circle system utilized Preoxygenation: Pre-oxygenation with 100% oxygen Induction Type: IV induction LMA: LMA inserted LMA Size: 3.0 Number of attempts: 1 Placement Confirmation: positive ETCO2 and breath sounds checked- equal and bilateral Tube secured with: Tape

## 2019-01-09 NOTE — Anesthesia Procedure Notes (Signed)
Anesthesia Regional Block: Interscalene brachial plexus block   Pre-Anesthetic Checklist: ,, timeout performed, Correct Patient, Correct Site, Correct Laterality, Correct Procedure, Correct Position, site marked, Risks and benefits discussed,  Surgical consent,  Pre-op evaluation,  At surgeon's request and post-op pain management  Laterality: Left  Prep: chloraprep       Needles:  Injection technique: Single-shot  Needle Type: Stimiplex     Needle Length: 10cm  Needle Gauge: 21     Additional Needles:   Procedures:,,,, ultrasound used (permanent image in chart),,,,  Narrative:  Start time: 01/09/2019 7:11 AM End time: 01/09/2019 7:19 AM Injection made incrementally with aspirations every 5 mL.  Performed by: Personally   Additional Notes: Functioning IV was confirmed and monitors applied. Ultrasound guidance: relevant anatomy identified, needle position confirmed, local anesthetic spread visualized around nerve(s)., vascular puncture avoided.  Image printed for medical record.  Negative aspiration and no paresthesias; incremental administration of local anesthetic. The patient tolerated the procedure well. Vitals signes recorded in RN notes.

## 2019-01-09 NOTE — H&P (Signed)
Paper H&P to be scanned into permanent record. H&P reviewed. No significant changes noted.  

## 2019-01-09 NOTE — Progress Notes (Signed)
Assisted Sheila Rivas ANMD with right, ultrasound guided, interscalene  block. Side rails up, monitors on throughout procedure. See vital signs in flow sheet. Tolerated Procedure well.  

## 2019-01-09 NOTE — Anesthesia Preprocedure Evaluation (Addendum)
Anesthesia Evaluation  Patient identified by MRN, date of birth, ID band Patient awake    History of Anesthesia Complications (+) history of anesthetic complications (states was admitted to the hospital after prior RCR for "low respirations." Did not have a nerve block. )  Airway Mallampati: IV  TM Distance: <3 FB   Mouth opening: Limited Mouth Opening  Dental no notable dental hx.    Pulmonary sleep apnea (doesn't wear CPAP) ,    Pulmonary exam normal        Cardiovascular Exercise Tolerance: Good hypertension, Normal cardiovascular exam     Neuro/Psych negative neurological ROS  negative psych ROS   GI/Hepatic negative GI ROS, Neg liver ROS,   Endo/Other  Obesity, BMI 32  Renal/GU negative Renal ROS     Musculoskeletal negative musculoskeletal ROS (+)   Abdominal   Peds  Hematology negative hematology ROS (+)   Anesthesia Other Findings   Reproductive/Obstetrics                             Anesthesia Physical Anesthesia Plan  ASA: III  Anesthesia Plan: General and Regional   Post-op Pain Management: GA combined w/ Regional for post-op pain   Induction: Intravenous  PONV Risk Score and Plan: 3 and TIVA, Propofol infusion and Midazolam  Airway Management Planned:   Additional Equipment:   Intra-op Plan:   Post-operative Plan:   Informed Consent: I have reviewed the patients History and Physical, chart, labs and discussed the procedure including the risks, benefits and alternatives for the proposed anesthesia with the patient or authorized representative who has indicated his/her understanding and acceptance.       Plan Discussed with: CRNA  Anesthesia Plan Comments: (While questioning patient about her allergies, she states that she has had anaphylactic reactions to a "pre-pain medication" at the dentist. She states that this is a gel that is put on her gums prior to a  procedure. She cannot provide any more information about this. She has never had a nerve block, but upon further questioning regarding local anesthetics, she states that she does get local at the dentist that is injected by the dentist, and she states that she has had neuraxial anesthesia for child birth. Based on this information I do not think that she has an allergy to local anesthetics. I will place a small subcutaneous test dose of local anesthetic in her arm prior to doing her nerve block and observe the area for 10 minutes. If there is no reaction to this test dose, I will proceed with her nerve block. I believe that a nerve block is an important component of this patient's anesthetic, as she has untreated OSA as well as what sounds like a hospitalization for respiratory depression secondary to opioid needs after a prior RCR.)       Anesthesia Quick Evaluation

## 2019-01-09 NOTE — Anesthesia Postprocedure Evaluation (Signed)
Anesthesia Post Note  Patient: Sheila Rivas  Procedure(s) Performed: Right mini-open rotator cuff repair Right arthroscopic extensive debridement of shoulder (glenohumeral and subacromial spaces) Right arthroscopic subacromial decompression (Right Shoulder)  Patient location during evaluation: PACU Anesthesia Type: Regional Level of consciousness: awake and alert Pain management: pain level controlled Vital Signs Assessment: post-procedure vital signs reviewed and stable Respiratory status: spontaneous breathing, nonlabored ventilation, respiratory function stable and patient connected to nasal cannula oxygen Cardiovascular status: blood pressure returned to baseline and stable Postop Assessment: no apparent nausea or vomiting Anesthetic complications: no    Adele Barthel Fareedah Mahler

## 2019-06-26 ENCOUNTER — Other Ambulatory Visit: Payer: Self-pay | Admitting: Sports Medicine

## 2019-06-26 DIAGNOSIS — M5442 Lumbago with sciatica, left side: Secondary | ICD-10-CM

## 2019-06-26 DIAGNOSIS — M25551 Pain in right hip: Secondary | ICD-10-CM

## 2019-06-26 DIAGNOSIS — M5137 Other intervertebral disc degeneration, lumbosacral region: Secondary | ICD-10-CM

## 2019-06-26 DIAGNOSIS — G8929 Other chronic pain: Secondary | ICD-10-CM

## 2019-06-26 DIAGNOSIS — M25552 Pain in left hip: Secondary | ICD-10-CM

## 2019-06-26 DIAGNOSIS — M48062 Spinal stenosis, lumbar region with neurogenic claudication: Secondary | ICD-10-CM

## 2019-06-26 DIAGNOSIS — M47816 Spondylosis without myelopathy or radiculopathy, lumbar region: Secondary | ICD-10-CM

## 2019-07-05 ENCOUNTER — Other Ambulatory Visit: Payer: Self-pay

## 2019-07-05 ENCOUNTER — Ambulatory Visit
Admission: RE | Admit: 2019-07-05 | Discharge: 2019-07-05 | Disposition: A | Discharge status: Home / Self Care | Payer: Medicare HMO | Source: Ambulatory Visit | Attending: Sports Medicine | Admitting: Sports Medicine

## 2019-07-05 DIAGNOSIS — M25552 Pain in left hip: Secondary | ICD-10-CM | POA: Diagnosis present

## 2019-07-05 DIAGNOSIS — M25551 Pain in right hip: Secondary | ICD-10-CM | POA: Diagnosis present

## 2019-07-05 DIAGNOSIS — M5137 Other intervertebral disc degeneration, lumbosacral region: Secondary | ICD-10-CM | POA: Diagnosis present

## 2019-07-05 DIAGNOSIS — M5442 Lumbago with sciatica, left side: Secondary | ICD-10-CM | POA: Insufficient documentation

## 2019-07-05 DIAGNOSIS — M47816 Spondylosis without myelopathy or radiculopathy, lumbar region: Secondary | ICD-10-CM | POA: Diagnosis present

## 2019-07-05 DIAGNOSIS — M48062 Spinal stenosis, lumbar region with neurogenic claudication: Secondary | ICD-10-CM | POA: Diagnosis present

## 2019-07-05 DIAGNOSIS — M51379 Other intervertebral disc degeneration, lumbosacral region without mention of lumbar back pain or lower extremity pain: Secondary | ICD-10-CM

## 2019-07-05 DIAGNOSIS — G8929 Other chronic pain: Secondary | ICD-10-CM

## 2019-07-05 IMAGING — MR MR LUMBAR SPINE W/O CM
5 series · 31 of 48 positions shown · non-contrast
Comparison: None.

CLINICAL DATA: Low back and left leg pain for 2-3 years. No known
injury.

EXAM:
MRI LUMBAR SPINE WITHOUT CONTRAST
TECHNIQUE: Multiplanar, multisequence MR imaging of the lumbar spine was
performed. No intravenous contrast was administered.

[Series 5: T2 · sagittal · 4.0mm · 0.81mm/px · 6 of 15 slices shown (1 of 2)]
[im 1/15]
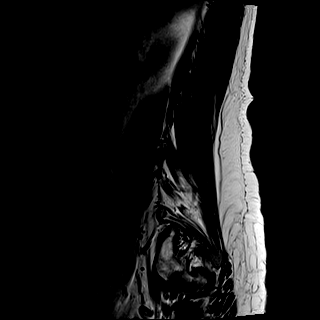
[im 3/15]
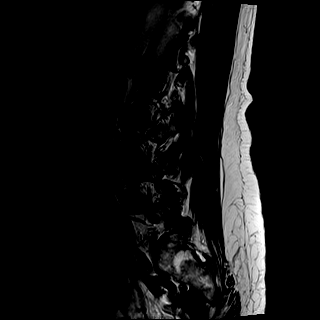
[im 6/15]
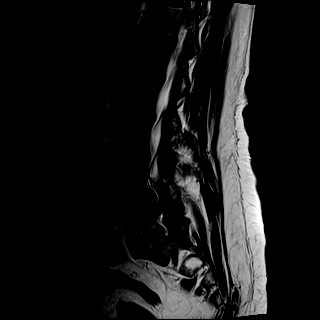
[im 9/15]
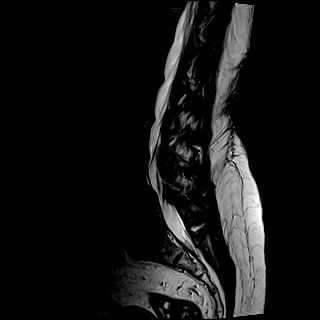
[im 12/15]
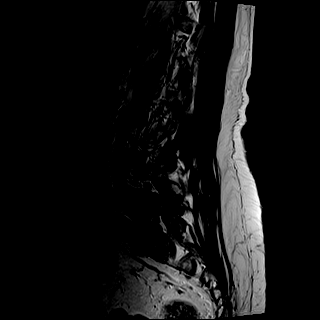
[im 15/15]
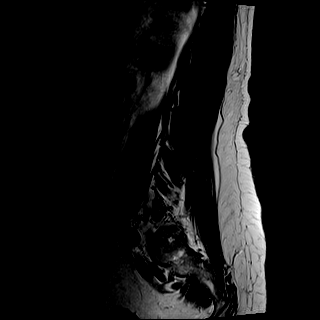

[Series 6: T1 · sagittal · 4.0mm · 0.81mm/px · 6 of 15 slices shown (1 of 2)]
[im 1/15]
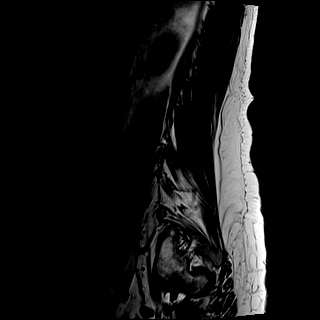
[im 3/15]
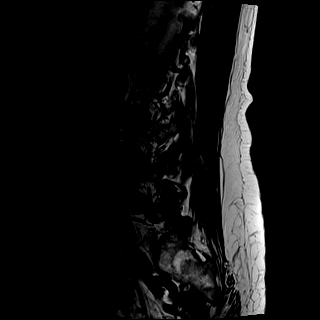
[im 6/15]
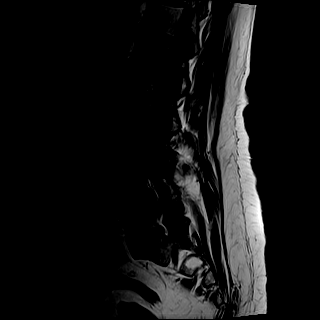
[im 9/15]
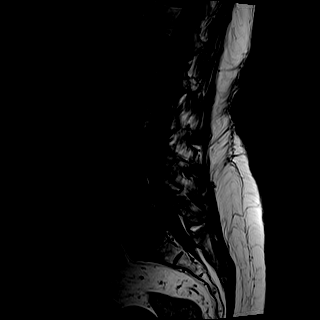
[im 12/15]
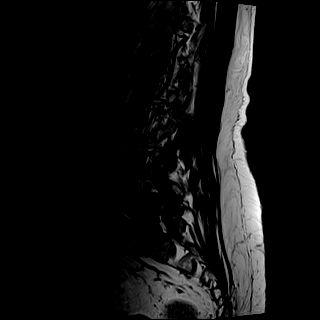
[im 15/15]
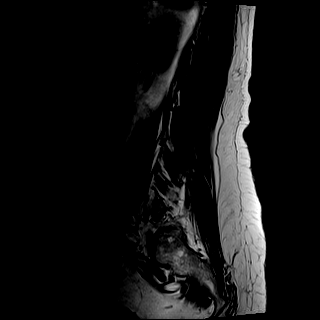

[Series 7: STIR · sagittal · 4.0mm · 0.41mm/px · 1 of 15 slices shown]
[im 1/15]
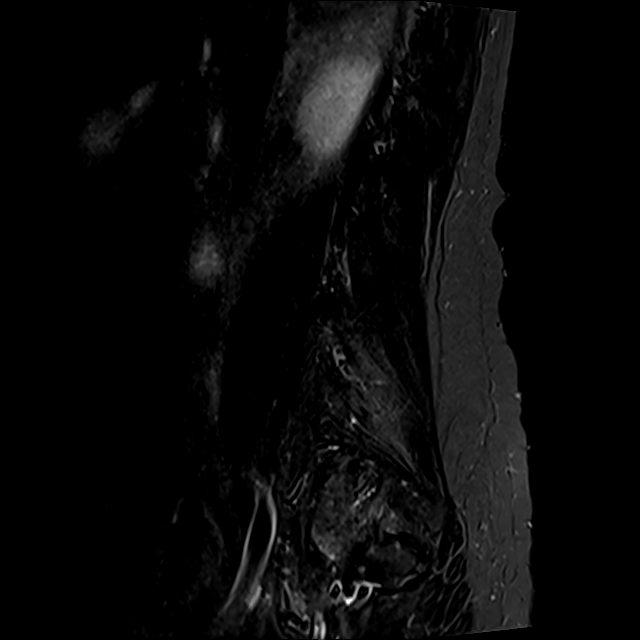

[Series 8: T2 · axial · 4.0mm · 0.78mm/px · z∈[-91,+125]mm · 9 of 36 slices shown (2 of 2)]
[im 1/36]
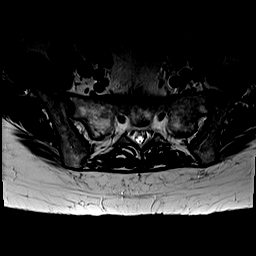
[im 6/36]
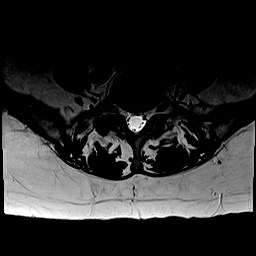
[im 11/36]
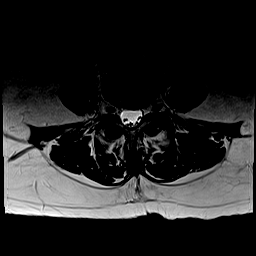
[im 16/36]
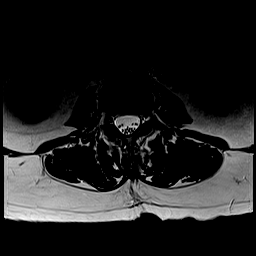
[im 18/36]
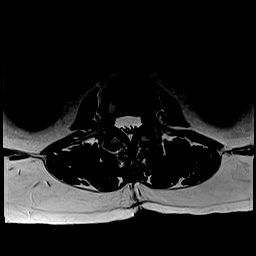
[im 21/36]
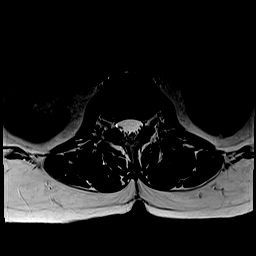
[im 26/36]
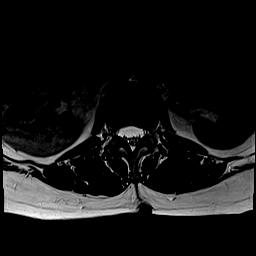
[im 31/36]
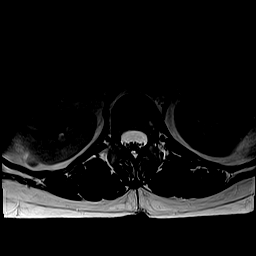
[im 36/36]
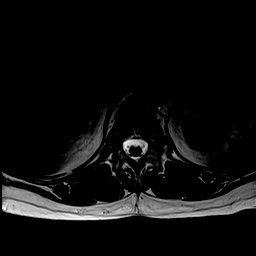

[Series 9: T1 · axial · 4.0mm · 0.39mm/px · z∈[-91,+125]mm · 9 of 36 slices shown (2 of 2)]
[im 1/36]
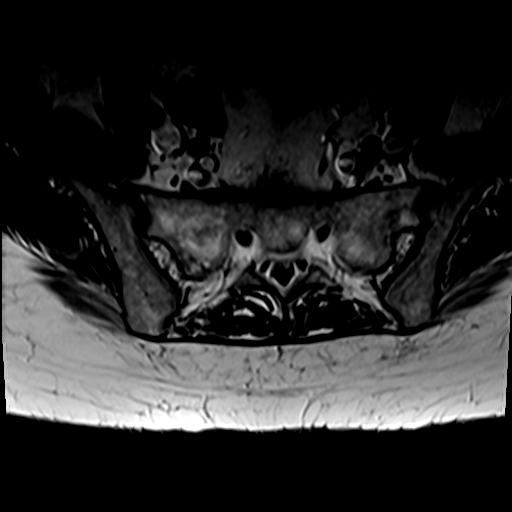
[im 6/36]
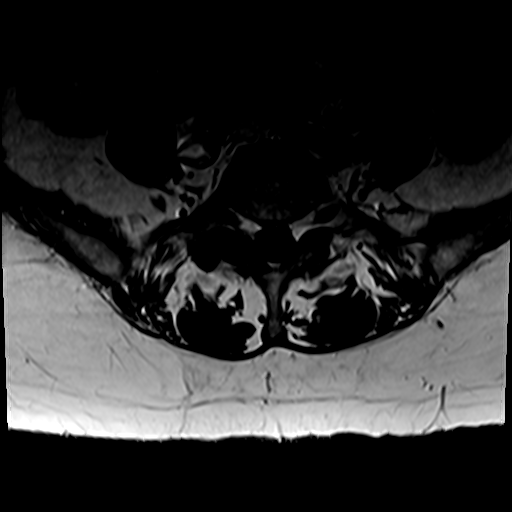
[im 11/36]
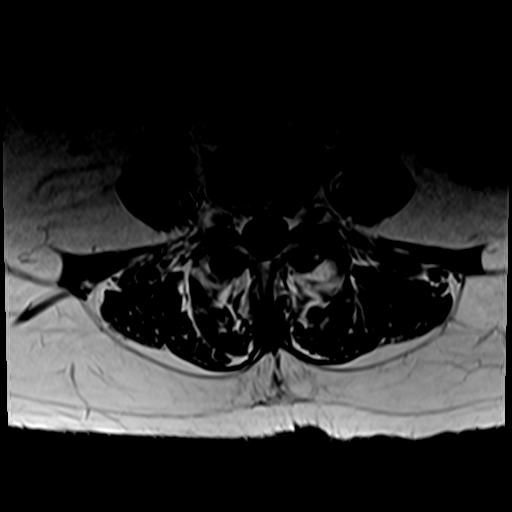
[im 16/36]
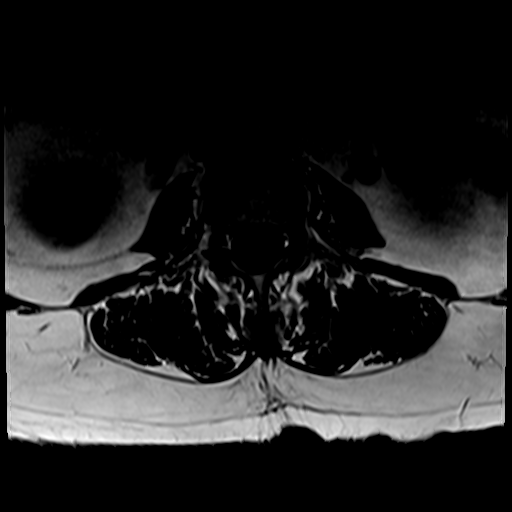
[im 18/36]
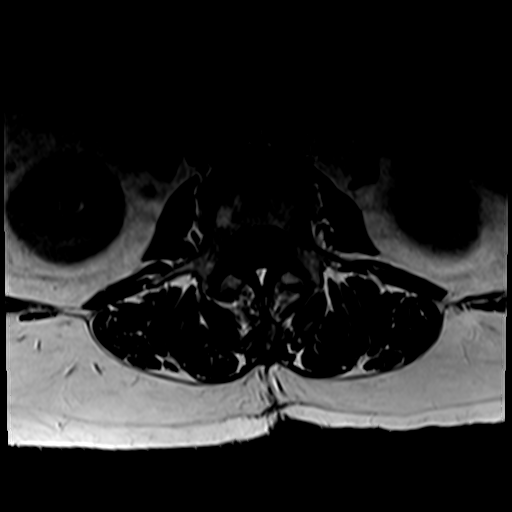
[im 21/36]
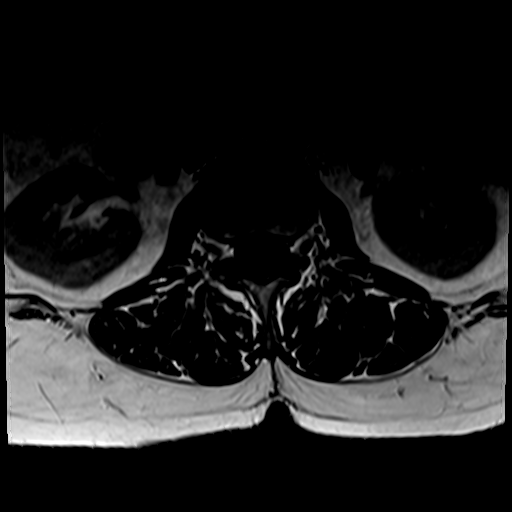
[im 26/36]
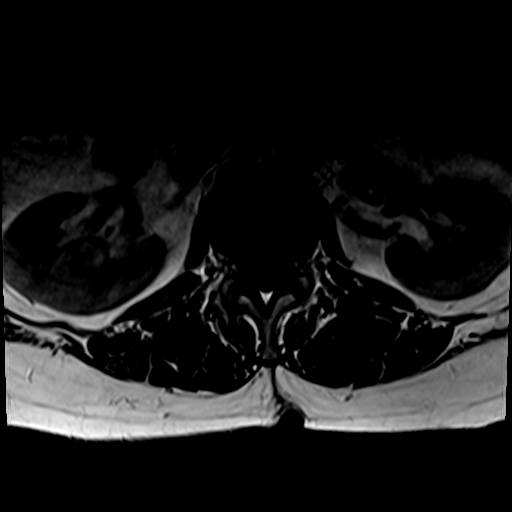
[im 31/36]
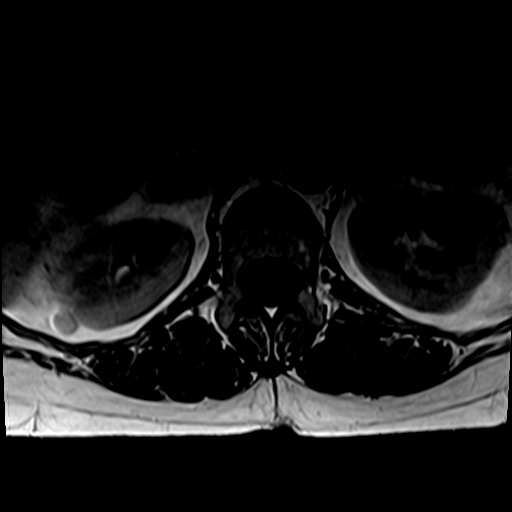
[im 36/36]
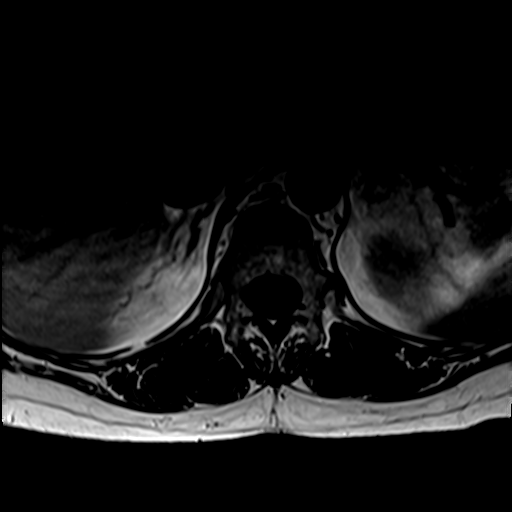

[31 of 48 positions shown; findings below may reference images not displayed]

FINDINGS: Segmentation:  Standard.

Alignment: 0.3 cm anterolisthesis L4 on L5 due to facet arthropathy.
Otherwise normal.

Vertebrae:  No fracture, evidence of discitis, or bone lesion.

Conus medullaris and cauda equina: Conus extends to the L1-2 level.
Conus and cauda equina appear normal.

Paraspinal and other soft tissues: Negative.

Disc levels:

T12-L1: Negative.

L1-2: Minimal disc bulge.  No stenosis.

L2-3: Minimal disc bulge without stenosis.

L3-4: Minimal disc bulge and mild-to-moderate facet degenerative
disease. No stenosis.

L4-5: Moderately severe bilateral facet degenerative change. The
disc is uncovered with a shallow bulge. There is mild central canal
narrowing and narrowing in the left subarticular recess which could
impact the left L5 root. Mild bilateral foraminal narrowing is more
notable on the left.

L5-S1: Facet degenerative change on the right. Shallow disc bulge
without stenosis.
IMPRESSION: Spondylosis appears worst at L4-5 where moderately severe bilateral
facet degenerative disease results in 0.3 cm anterolisthesis. There
is mild central canal stenosis and narrowing in the left
subarticular recess which could impact the descending left L5 root.
Mild bilateral foraminal narrowing is more notable on the left.

## 2019-07-27 DIAGNOSIS — M48061 Spinal stenosis, lumbar region without neurogenic claudication: Secondary | ICD-10-CM | POA: Insufficient documentation

## 2019-07-27 DIAGNOSIS — G8929 Other chronic pain: Secondary | ICD-10-CM | POA: Insufficient documentation

## 2020-01-12 ENCOUNTER — Encounter
Admission: RE | Admit: 2020-01-12 | Discharge: 2020-01-12 | Disposition: A | Discharge status: Home / Self Care | Payer: Medicare HMO | Source: Ambulatory Visit | Attending: Orthopedic Surgery | Admitting: Orthopedic Surgery

## 2020-01-12 ENCOUNTER — Other Ambulatory Visit: Payer: Self-pay

## 2020-01-12 DIAGNOSIS — Z01812 Encounter for preprocedural laboratory examination: Secondary | ICD-10-CM | POA: Insufficient documentation

## 2020-01-12 HISTORY — DX: Anemia, unspecified: D64.9

## 2020-01-12 HISTORY — DX: Anxiety disorder, unspecified: F41.9

## 2020-01-12 HISTORY — DX: Unspecified asthma, uncomplicated: J45.909

## 2020-01-12 LAB — URINALYSIS, ROUTINE W REFLEX MICROSCOPIC
Bacteria, UA: NONE SEEN
Bilirubin Urine: NEGATIVE
Glucose, UA: NEGATIVE mg/dL
Hgb urine dipstick: NEGATIVE
Ketones, ur: NEGATIVE mg/dL
Nitrite: NEGATIVE
Protein, ur: NEGATIVE mg/dL
Specific Gravity, Urine: 1.009 (ref 1.005–1.030)
pH: 7 (ref 5.0–8.0)

## 2020-01-12 LAB — TYPE AND SCREEN
ABO/RH(D): A POS
Antibody Screen: NEGATIVE

## 2020-01-12 LAB — CBC WITH DIFFERENTIAL/PLATELET
Abs Immature Granulocytes: 0.06 10*3/uL (ref 0.00–0.07)
Basophils Absolute: 0 10*3/uL (ref 0.0–0.1)
Basophils Relative: 1 %
Eosinophils Absolute: 0.1 10*3/uL (ref 0.0–0.5)
Eosinophils Relative: 1 %
HCT: 42.8 % (ref 36.0–46.0)
Hemoglobin: 14.8 g/dL (ref 12.0–15.0)
Immature Granulocytes: 1 %
Lymphocytes Relative: 17 %
Lymphs Abs: 1.4 10*3/uL (ref 0.7–4.0)
MCH: 32.2 pg (ref 26.0–34.0)
MCHC: 34.6 g/dL (ref 30.0–36.0)
MCV: 93 fL (ref 80.0–100.0)
Monocytes Absolute: 0.7 10*3/uL (ref 0.1–1.0)
Monocytes Relative: 9 %
Neutro Abs: 5.5 10*3/uL (ref 1.7–7.7)
Neutrophils Relative %: 71 %
Platelets: 281 10*3/uL (ref 150–400)
RBC: 4.6 MIL/uL (ref 3.87–5.11)
RDW: 11.9 % (ref 11.5–15.5)
WBC: 7.8 10*3/uL (ref 4.0–10.5)
nRBC: 0 % (ref 0.0–0.2)

## 2020-01-12 LAB — SEDIMENTATION RATE: Sed Rate: 27 mm/hr (ref 0–30)

## 2020-01-12 LAB — COMPREHENSIVE METABOLIC PANEL
ALT: 26 U/L (ref 0–44)
AST: 27 U/L (ref 15–41)
Albumin: 4.5 g/dL (ref 3.5–5.0)
Alkaline Phosphatase: 74 U/L (ref 38–126)
Anion gap: 12 (ref 5–15)
BUN: 26 mg/dL — ABNORMAL HIGH (ref 8–23)
CO2: 25 mmol/L (ref 22–32)
Calcium: 9.7 mg/dL (ref 8.9–10.3)
Chloride: 101 mmol/L (ref 98–111)
Creatinine, Ser: 0.63 mg/dL (ref 0.44–1.00)
GFR calc Af Amer: >60 mL/min (ref >60)
GFR calc non Af Amer: >60 mL/min (ref >60)
Glucose, Bld: 79 mg/dL (ref 70–99)
Potassium: 3.3 mmol/L — ABNORMAL LOW (ref 3.5–5.1)
Sodium: 138 mmol/L (ref 135–145)
Total Bilirubin: 0.7 mg/dL (ref 0.3–1.2)
Total Protein: 8.1 g/dL (ref 6.5–8.1)

## 2020-01-12 LAB — PROTIME-INR
INR: 0.9 (ref 0.8–1.2)
Prothrombin Time: 12.2 seconds (ref 11.4–15.2)

## 2020-01-12 LAB — SURGICAL PCR SCREEN
MRSA, PCR: NEGATIVE
Staphylococcus aureus: POSITIVE — AB

## 2020-01-12 LAB — APTT: aPTT: 29 seconds (ref 24–36)

## 2020-01-12 LAB — C-REACTIVE PROTEIN: CRP: 1 mg/dL — ABNORMAL HIGH (ref <1.0)

## 2020-01-12 NOTE — Patient Instructions (Signed)
Your procedure is scheduled on: Wed 9/29 Report to Day Surgery. To find out your arrival time please call [phone] between 1PM - 3PM on Tues  9/28.  Remember: Instructions that are not followed completely may result in serious medical risk,  up to and including death, or upon the discretion of your surgeon and anesthesiologist your  surgery may need to be rescheduled.     _X__ 1. Do not eat food after midnight the night before your procedure.                 No chewing gum or hard candies. You may drink clear liquids up to 2 hours                 before you are scheduled to arrive for your surgery- DO not drink clear                 liquids within 2 hours of the start of your surgery.                 Clear Liquids include:  water, apple juice without pulp, clear Gatorade, G2 or                  Gatorade Zero (avoid Red/Purple/Blue), Black Coffee or Tea (Do not add                 anything to coffee or tea). __x___2.   Complete the "Ensure Clear Pre-surgery Clear Carbohydrate Drink" provided to you, 2 hours before arrival.   __X__2.  On the morning of surgery brush your teeth with toothpaste and water, you                may rinse your mouth with mouthwash if you wish.  Do not swallow any toothpaste of mouthwash.     _X__ 3.  No Alcohol for 24 hours before or after surgery.   ___ 4.  Do Not Smoke or use e-cigarettes For 24 Hours Prior to Your Surgery.                 Do not use any chewable tobacco products for at least 6 hours prior to                 Surgery.  ___  5.  Do not use any recreational drugs (marijuana, cocaine, heroin, ecstasy, MDMA or other)                For at least one week prior to your surgery.  Combination of these drugs with anesthesia                May have life threatening results.  ____  6.  Bring all medications with you on the day of surgery if instructed.   __x__  7.  Notify your doctor if there is any change in your medical  condition      (cold, fever, infections).     Do not wear jewelry, make-up, hairpins, clips or nail polish. Do not wear lotions, powders, or perfumes. You may wear deodorant. Do not shave 48 hours prior to surgery.  Do not bring valuables to the hospital.    [facility] is not responsible for any belongings or valuables.  Contacts, dentures or bridgework may not be worn into surgery. Leave your suitcase in the car. After surgery it may be brought to your room. For patients admitted to the hospital, discharge time is determined by your  treatment team.   Patients discharged the day of surgery will not be allowed to drive home.   Make arrangements for someone to be with you for the first 24 hours of your Same Day Discharge.    Please read over the following fact sheets that you were given:   Incentive spirometer    __x__ Take these medicines the morning of surgery with A SIP OF WATER:    1. acetaminophen (TYLENOL) 500 MG tablet  If needed  2. gabapentin (NEURONTIN) 300 MG capsule  3.   4.  5.  6.  ____ Fleet Enema (as directed)   __x__ Use CHG Soap (or wipes) as directed  ____ Use Benzoyl Peroxide Gel as instructed  ____ Use inhalers on the day of surgery  ____ Stop metformin 2 days prior to surgery    ____ Take 1/2 of usual insulin dose the night before surgery. No insulin the morning          of surgery.   ____ Stop Coumadin/Plavix/aspirin on   _x___ Stop Anti-inflammatories ibuprofen aleve or aspirin products    May have tylenol  Melatonin 10 MG TABS __x__ Stop supplements until after surgery.  Omega-3 Fatty Acids (FISH OIL) 1200 MG CAPS, Ginkgo Biloba 500 MG CAPS,CANNABIDIOL PO tomorrow  ____ Bring C-Pap to the hospital.    If you have any questions regarding your pre-procedure instructions,  Please call Pre-admit Testing at [phone] [facility]

## 2020-01-13 LAB — URINE CULTURE
Culture: NO GROWTH
Special Requests: NORMAL

## 2020-01-14 LAB — LATEX, IGE: Latex: <0.1 kU/L

## 2020-01-14 NOTE — Progress Notes (Signed)
  [facility] Perioperative Services: Pre-Admission/Anesthesia Testing  Abnormal Lab Notification  Date: 01/14/20  Name: Mercedies Ganesh MRN:   [id number]  Re: Abnormal labs noted during PAT appointment  Provider Notified: No att. providers found Notification mode: Routed and/or faxed via [facility]  Labs of concern: Lab Results  Component Value Date   STAPHAUREUS POSITIVE (A) 01/12/2020   MRSAPCR NEGATIVE 01/12/2020     Notes: Patient is scheduled for a TOTAL HIP ARTHROPLASTY (Left Hip) on 01/20/2020. This is a Personal assistant; no formal response required.   Quentin Mulling, MSN, APRN, FNP-C, CEN [facility]  Peri-operative Services Nurse Practitioner Phone: [phone] 01/14/20 10:24 AM

## 2020-01-15 ENCOUNTER — Other Ambulatory Visit: Payer: Medicare HMO

## 2020-01-18 ENCOUNTER — Other Ambulatory Visit: Payer: Self-pay

## 2020-01-18 ENCOUNTER — Other Ambulatory Visit
Admission: RE | Admit: 2020-01-18 | Discharge: 2020-01-18 | Disposition: A | Discharge status: Home / Self Care | Payer: Medicare HMO | Source: Ambulatory Visit | Attending: Orthopedic Surgery | Admitting: Orthopedic Surgery

## 2020-01-18 DIAGNOSIS — Z01812 Encounter for preprocedural laboratory examination: Secondary | ICD-10-CM | POA: Diagnosis present

## 2020-01-18 DIAGNOSIS — Z20822 Contact with and (suspected) exposure to covid-19: Secondary | ICD-10-CM | POA: Diagnosis not present

## 2020-01-18 LAB — SARS CORONAVIRUS 2 (TAT 6-24 HRS): SARS Coronavirus 2: NEGATIVE

## 2020-01-19 ENCOUNTER — Encounter: Payer: Self-pay | Admitting: Orthopedic Surgery

## 2020-01-19 DIAGNOSIS — E669 Obesity, unspecified: Secondary | ICD-10-CM | POA: Insufficient documentation

## 2020-01-19 DIAGNOSIS — I10 Essential (primary) hypertension: Secondary | ICD-10-CM | POA: Insufficient documentation

## 2020-01-19 DIAGNOSIS — J4520 Mild intermittent asthma, uncomplicated: Secondary | ICD-10-CM | POA: Insufficient documentation

## 2020-01-19 NOTE — H&P (Signed)
ORTHOPAEDIC HISTORY & PHYSICAL Progress Notes Sheila Gardener, PA - 01/14/2020 11:00 AM EDT [facility] Chief Complaint:   Chief Complaint  Patient presents with  . Hip Pain  H & P LEFT HIP   History of Present Illness:   Sheila Rivas is a 69 y.o. female that presents to clinic today for her preoperative history and evaluation. Patient presents unaccompanied. The patient is scheduled to undergo a left total hip arthroplasty on 01/20/20 by Dr. Ernest Pine. Patient reports a long history of left hip and groin pain. She describes her pain as worse with weightbearing. She denies associated numbness or tingling.   The patient's symptoms have progressed to the point that they decrease her quality of life. The patient has previously undergone conservative treatment including NSAIDS, activity modification, and intra-articular hip injection without adequate control of her symptoms.  Patient denies history of blood clots, lower back surgery, or significant cardiac history.  Per chart review, patient does have an allergy to latex.  Past Medical, Surgical, Family, Social History, Allergies, Medications:   Past Medical History:  Past Medical History:  Diagnosis Date  . Chronic low back pain without sciatica  . DDD (degenerative disc disease), lumbar 2019  Mild multilevel DDD, worse at L4-5  . GERD (gastroesophageal reflux disease)  Reasonably well controlled on probiotics only.  . Hypertension  . Mild intermittent asthma  . Obesity (BMI 30-39.9), unspecified  . 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  . Osteopenia 10/02/2019  FRAX: 0.5% 10 year risk of hip fracture, 7.6% 10 year risk of any major fracture.  . Sleep apnea  Not on CPAP as would remove unconsciously in sleep.  . Toe fracture, right   Past Surgical History:  Past Surgical History:  Procedure Laterality Date  . ARTHROSCOPIC ROTATOR  CUFF REPAIR Right 2013  . COLONOSCOPY 2016  2 polyps removed--repeat 5 yrs  . R mini-open rotator cuff repair, arthroscopic extensive debridment of shoulder (glenohumeral and subacromial spaces) R arhtroscopic subacromial decompression Right 01/09/2019  Dr. Allena Katz   Current Medications:  Current Outpatient Medications  Medication Sig Dispense Refill  . carboxymethylcellulose-glycerin (REFRESH RELIEVA) 0.5-0.9 % ophthalmic solution Place 1-2 drops into both eyes 2 (two) times daily  . menthol (ASPERCREME HEAT) 10 % Gel Apply topically as directed  . acetaminophen (TYLENOL) 500 MG tablet Take 1,000 mg by mouth 3 (three) times daily  . calcium carbonate 600 mg calcium (1,500 mg) Tab tablet Take 1,200 mg by mouth once daily  . cholecalciferol (VITAMIN D3) 1,000 unit capsule Take 2,000 Units by mouth once daily  . docosahexanoic acid/epa (FISH OIL ORAL) Take 1,000 mg by mouth once daily  . gabapentin (NEURONTIN) 300 MG capsule TAKE 1 CAPSULE BY MOUTH THREE TIMES DAILY 90 capsule 0  . ginkgo biloba 60 mg Cap Take 1 capsule by mouth once daily  . hydroCHLOROthiazide (HYDRODIURIL) 25 MG tablet Take 1 tablet by mouth once daily 90 tablet 1  . lidocaine HCL 4 % Crea Apply topically  . losartan (COZAAR) 50 MG tablet Take 1 tablet by mouth once daily 90 tablet 1  . magnesium 250 mg Tab Take 250 mg by mouth once daily  . melatonin 10 mg Tab Take 10 mg by mouth nightly  . vitamin B complex (B COMPLEX ORAL) Take 2 tablets by mouth once daily   No current facility-administered medications for this visit.   Allergies:  Allergies  Allergen Reactions  .  Gluten Other (See Comments)  Bloating, also sharp pain  . Latex Rash  Redness where underwear elastic touches skin   Social History:  Social History   Socioeconomic History  . Marital status: Divorced  Spouse name: Not on file  . Number of children: 5  . Years of education: 45  . Highest education level: Bachelor's degree (e.g., BA, AB, BS)   Occupational History  . Occupation: Retired  Comment: Runner, broadcasting/film/video, Charity fundraiser, Designer, industrial/product  Tobacco Use  . Smoking status: Never Smoker  . Smokeless tobacco: Never Used  Vaping Use  . Vaping Use: Never used  Substance and Sexual Activity  . Alcohol use: Yes  Comment: sometimes socially  . Drug use: Never  . Sexual activity: Not Currently  Partners: Male  Birth control/protection: None  Other Topics Concern  . Not on file  Social History Narrative  Moved to the area from [place] in 2018 to be near 1 daughter & brought another daughter w/ her.   Social Determinants of Health   Financial Resource Strain:  . Difficulty of Paying Living Expenses:  Food Insecurity:  . Worried About Programme researcher, broadcasting/film/video in the Last Year:  . Barista in the Last Year:  Transportation Needs:  . Freight forwarder (Medical):  Marland Kitchen Lack of Transportation (Non-Medical):  Physical Activity:  . Days of Exercise per Week:  . Minutes of Exercise per Session:  Stress:  . Feeling of Stress :  Social Connections:  . Frequency of Communication with Friends and Family:  . Frequency of Social Gatherings with Friends and Family:  . Attends Religious Services:  . Active Member of Clubs or Organizations:  . Attends Banker Meetings:  Marland Kitchen Marital Status:   Family History:  Family History  Problem Relation Age of Onset  . Myocardial Infarction (Heart attack) Mother  . Thyroid disease Mother  . Diabetes type I Mother  . Alcohol abuse Father  . Myocardial Infarction (Heart attack) Father  . No Known Problems Daughter  . No Known Problems Son  . Breast cancer Paternal Grandmother  . No Known Problems Daughter  . No Known Problems Daughter  . No Known Problems Son  . No Known Problems Sister  . Other Maternal Grandmother  Some type of autoimmune arthritis  . No Known Problems Sister   Review of Systems:   A 10+ ROS was performed, reviewed, and the pertinent orthopaedic findings are documented in the  HPI.   Physical Examination:   BP 120/70  Ht 156.2 cm (5' 1.5")  Wt 76.5 kg (168 lb 9.6 oz)  BMI 31.34 kg/m   Patient is a well-developed, well-nourished female in no acute distress. Patient has normal mood and affect. Patient is alert and oriented to person, place, and time.   HEENT: Atraumatic, normocephalic. Pupils equal and reactive to light. Extraocular motion intact. Noninjected sclera.  Cardiovascular: Regular rate and rhythm, with no murmurs, rubs, or gallops. Distal pulses palpable.  Respiratory: Lungs clear to auscultation bilaterally.    Left Hip: Pelvic tilt: Negative Limb lengths: Equal with the patient standing Soft tissue swelling: Negative Erythema: Negative Crepitance: Negative Tenderness: Greater trochanter is nontender to palpation. Moderate pain is elicited by axial compression or extremes of rotation. Atrophy: No atrophy. Fair to good hip flexor and abductor strength. Range of Motion: EXT/FLEX: 0/0/100 ADD/ABD: 20/0/10 IR/ER: 0/0/10  Patient able to actively plantarflex and dorsiflex the left ankle.  Sensation is intact over the saphenous, lateral cutaneous, superficial fibular, and deep  fibular nerve distributions.  Tests Performed/Reviewed:  X-rays  No new radiographs were obtained today. Previous radiographs were reviewed of the right hip and revealed complete loss of femoral acetabular joint space with bone-on-bone contact and deformation of the femoral head. Cystic changes of the femoral head noted. Subchondral sclerosis is noted. No fractures or dislocations noted.  Impression:   ICD-10-CM  1. Primary osteoarthritis of left hip M16.12   Plan:   The patient has end-stage degenerative changes of the left hip. It was explained to the patient that the condition is progressive in nature. Having failed conservative treatment, the patient has elected to proceed with a total joint arthroplasty. The patient will undergo a total joint arthroplasty with  Dr. Ernest Pine. The risks of surgery, including blood clot and infection, were discussed with the patient. Measures to reduce these risks, including the use of anticoagulation, perioperative antibiotics, and early ambulation were discussed. The importance of postoperative physical therapy was discussed with the patient. The patient elects to proceed with surgery. The patient is instructed to stop all blood thinners prior to surgery. The patient is instructed to call the hospital the day before surgery to learn of the proper arrival time.   Contact our office with any questions or concerns. Follow up as indicated, or sooner should any new problems arise, if conditions worsen, or if they are otherwise concerned.   Sheila Gardener, PA-C [facility] [address] [place], [place] [address] Phone: [phone]  This note was generated in part with voice recognition software and I apologize for any typographical errors that were not detected and corrected.   Electronically signed by Sheila Gardener, PA at 01/14/2020 3:50 PM EDT

## 2020-01-20 ENCOUNTER — Ambulatory Visit: Payer: Medicare HMO | Admitting: Certified Registered"

## 2020-01-20 ENCOUNTER — Ambulatory Visit
Admission: RE | Admit: 2020-01-20 | Discharge: 2020-01-20 | Disposition: A | Discharge status: Home / Self Care | Payer: Medicare HMO | Attending: Orthopedic Surgery | Admitting: Orthopedic Surgery

## 2020-01-20 ENCOUNTER — Encounter: Payer: Self-pay | Admitting: Orthopedic Surgery

## 2020-01-20 ENCOUNTER — Ambulatory Visit: Payer: Medicare HMO

## 2020-01-20 ENCOUNTER — Ambulatory Visit: Payer: Medicare HMO | Admitting: Urgent Care

## 2020-01-20 ENCOUNTER — Other Ambulatory Visit: Payer: Self-pay

## 2020-01-20 ENCOUNTER — Encounter
Admission: RE | Disposition: A | Discharge status: Home / Self Care | Payer: Self-pay | Source: Home / Self Care | Attending: Orthopedic Surgery

## 2020-01-20 DIAGNOSIS — M1612 Unilateral primary osteoarthritis, left hip: Secondary | ICD-10-CM | POA: Insufficient documentation

## 2020-01-20 DIAGNOSIS — Z79899 Other long term (current) drug therapy: Secondary | ICD-10-CM | POA: Insufficient documentation

## 2020-01-20 DIAGNOSIS — E669 Obesity, unspecified: Secondary | ICD-10-CM | POA: Insufficient documentation

## 2020-01-20 DIAGNOSIS — M8580 Other specified disorders of bone density and structure, unspecified site: Secondary | ICD-10-CM | POA: Insufficient documentation

## 2020-01-20 DIAGNOSIS — Z96649 Presence of unspecified artificial hip joint: Secondary | ICD-10-CM

## 2020-01-20 DIAGNOSIS — K219 Gastro-esophageal reflux disease without esophagitis: Secondary | ICD-10-CM | POA: Insufficient documentation

## 2020-01-20 DIAGNOSIS — Z96642 Presence of left artificial hip joint: Secondary | ICD-10-CM

## 2020-01-20 DIAGNOSIS — Z6832 Body mass index (BMI) 32.0-32.9, adult: Secondary | ICD-10-CM | POA: Diagnosis not present

## 2020-01-20 DIAGNOSIS — J4520 Mild intermittent asthma, uncomplicated: Secondary | ICD-10-CM | POA: Diagnosis not present

## 2020-01-20 DIAGNOSIS — G4730 Sleep apnea, unspecified: Secondary | ICD-10-CM | POA: Diagnosis not present

## 2020-01-20 DIAGNOSIS — I10 Essential (primary) hypertension: Secondary | ICD-10-CM | POA: Insufficient documentation

## 2020-01-20 HISTORY — PX: TOTAL HIP ARTHROPLASTY: SHX124

## 2020-01-20 LAB — ABO/RH: ABO/RH(D): A POS

## 2020-01-20 IMAGING — DX DG HIP (WITH OR WITHOUT PELVIS) 1V PORT*L*
2 series · 2 of 2 positions shown · non-contrast
Comparison: Portable exam [C1] hours without priors available for
comparison

CLINICAL DATA: Post LEFT total hip arthroplasty

EXAM:
DG HIP (WITH OR WITHOUT PELVIS) 1V PORT LEFT

[pelvis ap]
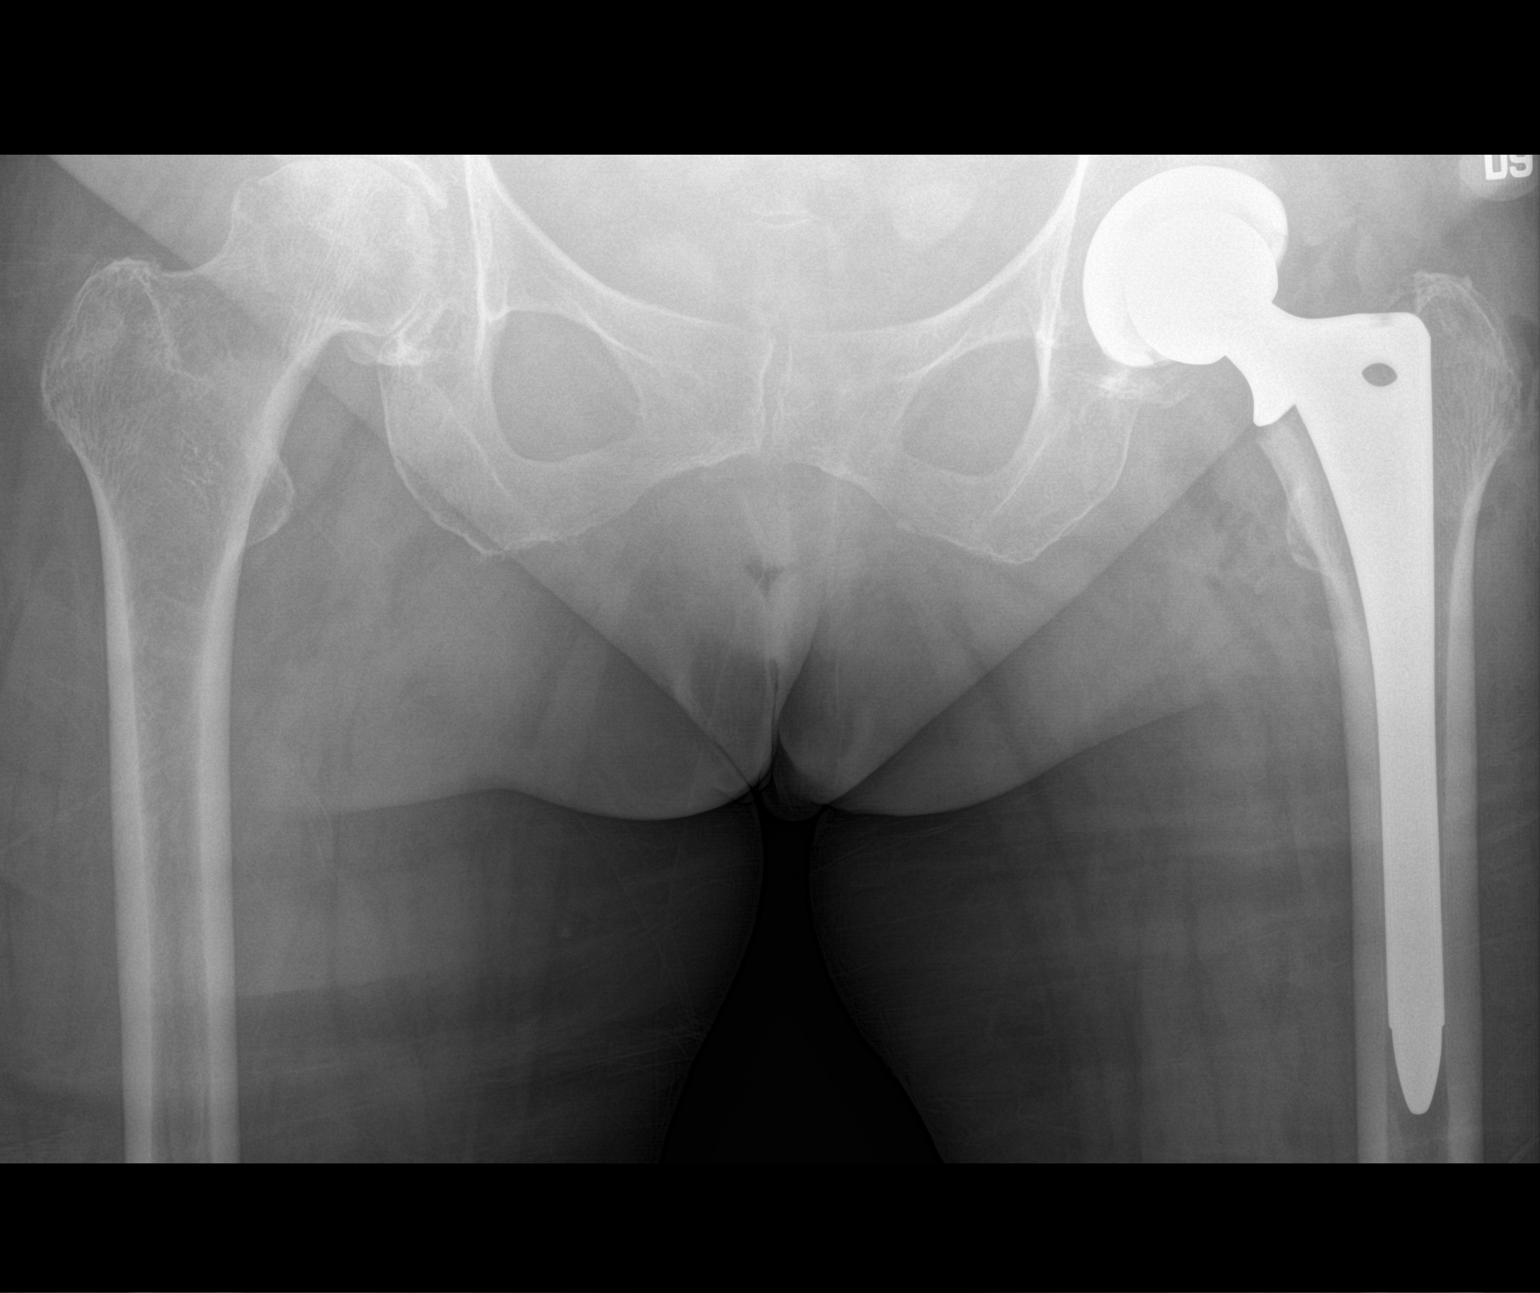

[hip lat]
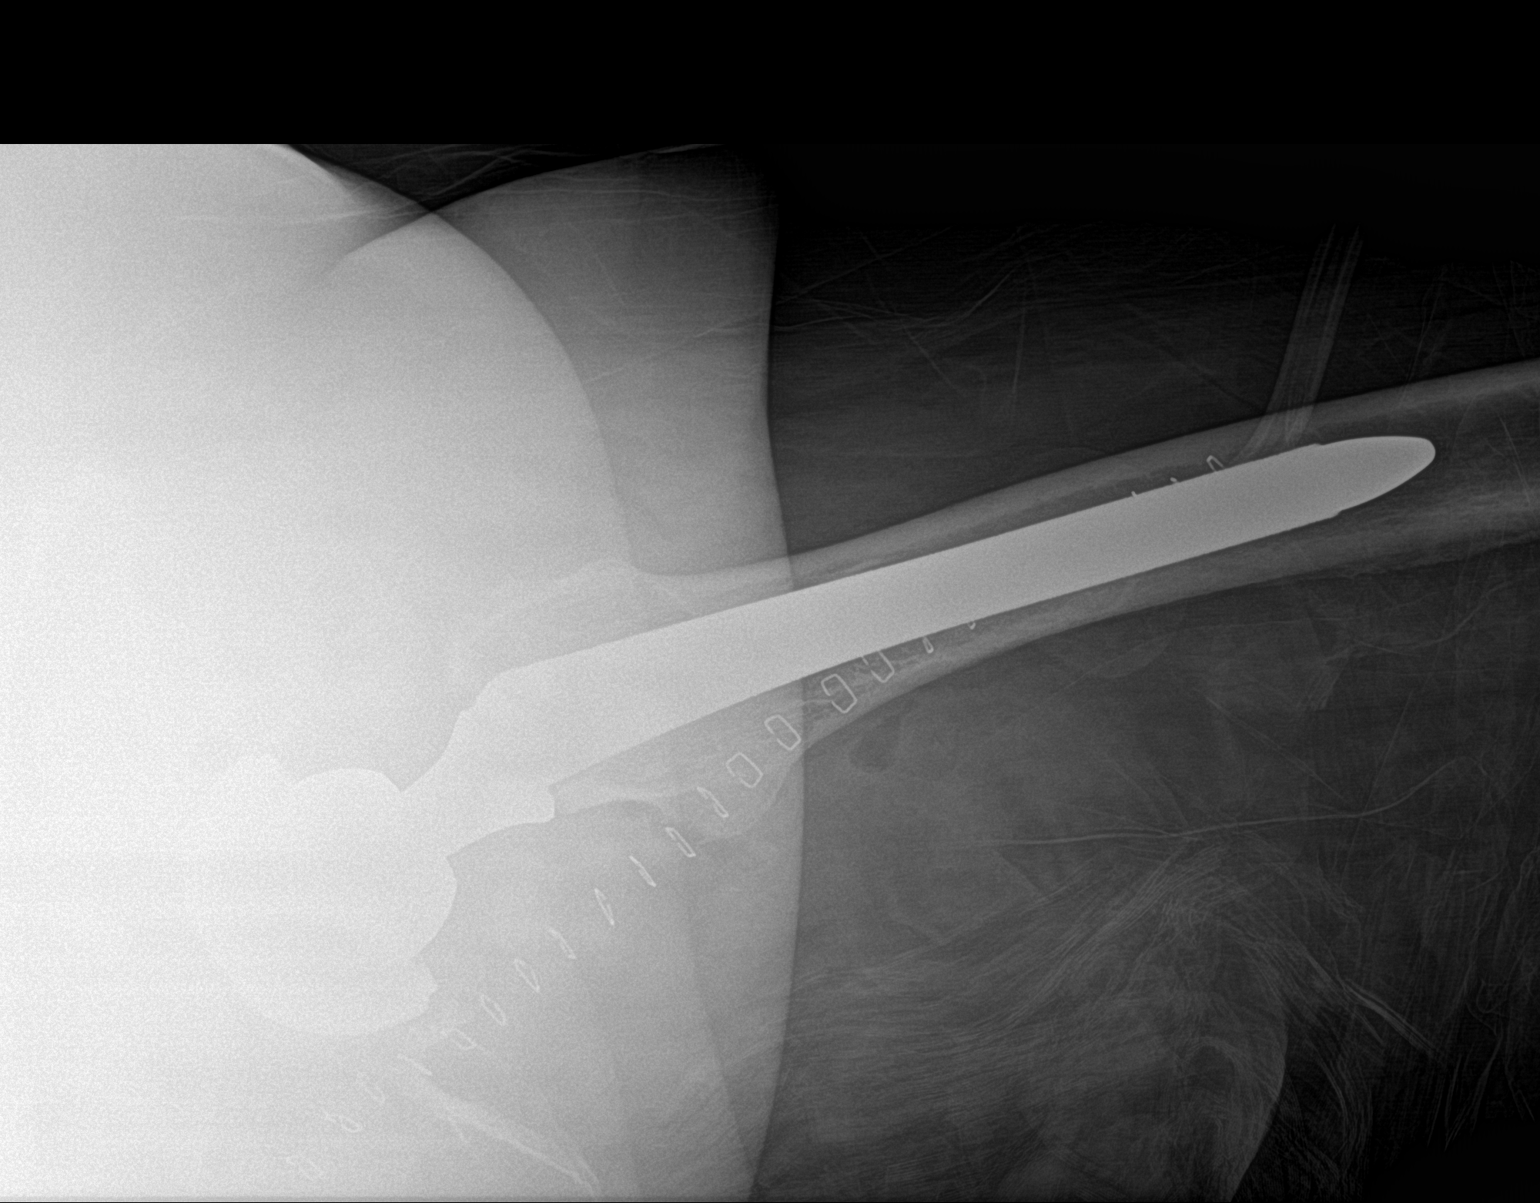

[2 of 2 positions shown; findings below may reference images not displayed]

FINDINGS: Osseous demineralization.

LEFT hip prosthesis in expected position.

No fracture, dislocation or bone destruction.

Advanced osteoarthritic changes of the RIGHT hip joint with joint
space narrowing, spur formation and subchondral cyst formation.

Flattening of the superior aspect of the femoral head suggesting
minimal collapse.
IMPRESSION: LEFT hip prosthesis without acute complication.

Degenerative changes of the RIGHT hip joint with flattening of the
superior margin of the RIGHT femoral head, question minimal
collapse.

## 2020-01-20 SURGERY — ARTHROPLASTY, HIP, TOTAL,POSTERIOR APPROACH
Anesthesia: Spinal | Site: Hip | Laterality: Left

## 2020-01-20 MED ORDER — FENTANYL CITRATE (PF) 100 MCG/2ML IJ SOLN
INTRAMUSCULAR | Status: AC
  Administered 2020-01-20: 25 ug via INTRAVENOUS
  Filled 2020-01-20: qty 2

## 2020-01-20 MED ORDER — OXYCODONE HCL 5 MG PO TABS
5.0000 mg | ORAL_TABLET | ORAL | Status: DC | PRN
  Administered 2020-01-20 (×2): 5 mg via ORAL
  Filled 2020-01-20 (×3): qty 2

## 2020-01-20 MED ORDER — OXYCODONE HCL 5 MG PO TABS
ORAL_TABLET | ORAL | Status: DC
Start: 2020-01-20 — End: 2020-01-20
  Filled 2020-01-20: qty 1

## 2020-01-20 MED ORDER — SODIUM CHLORIDE 0.9 % IV SOLN: INTRAVENOUS | Status: DC

## 2020-01-20 MED ORDER — CEFAZOLIN SODIUM-DEXTROSE 2-4 GM/100ML-% IV SOLN
INTRAVENOUS | Status: AC
  Filled 2020-01-20: qty 100

## 2020-01-20 MED ORDER — ONDANSETRON HCL 4 MG/2ML IJ SOLN
INTRAMUSCULAR | Status: DC | PRN
  Administered 2020-01-20: 4 mg via INTRAVENOUS

## 2020-01-20 MED ORDER — OXYCODONE HCL 5 MG PO TABS
10.0000 mg | ORAL_TABLET | ORAL | Status: DC | PRN
  Administered 2020-01-20: 5 mg via ORAL
  Filled 2020-01-20 (×2): qty 3

## 2020-01-20 MED ORDER — MORPHINE SULFATE (PF) 4 MG/ML IV SOLN
INTRAVENOUS | Status: AC
  Filled 2020-01-20: qty 1

## 2020-01-20 MED ORDER — SENNOSIDES-DOCUSATE SODIUM 8.6-50 MG PO TABS
1.0000 | ORAL_TABLET | Freq: Two times a day (BID) | ORAL | Status: DC
  Filled 2020-01-20: qty 1

## 2020-01-20 MED ORDER — GABAPENTIN 300 MG PO CAPS: 300.0000 mg | ORAL_CAPSULE | Freq: Once | ORAL | Status: DC

## 2020-01-20 MED ORDER — KETAMINE HCL 50 MG/ML IJ SOLN
INTRAMUSCULAR | Status: AC
  Filled 2020-01-20: qty 10

## 2020-01-20 MED ORDER — OXYCODONE HCL 5 MG PO TABS: 5.0000 mg | ORAL_TABLET | ORAL | 0 refills | Status: DC | PRN

## 2020-01-20 MED ORDER — ONDANSETRON HCL 4 MG/2ML IJ SOLN: 4.0000 mg | Freq: Four times a day (QID) | INTRAMUSCULAR | Status: DC | PRN

## 2020-01-20 MED ORDER — ACETAMINOPHEN 10 MG/ML IV SOLN
INTRAVENOUS | Status: DC | PRN
  Administered 2020-01-20: 1000 mg via INTRAVENOUS

## 2020-01-20 MED ORDER — METOCLOPRAMIDE HCL 10 MG PO TABS: 10.0000 mg | ORAL_TABLET | Freq: Three times a day (TID) | ORAL | Status: DC

## 2020-01-20 MED ORDER — PROPOFOL 500 MG/50ML IV EMUL
INTRAVENOUS | Status: AC
  Filled 2020-01-20: qty 50

## 2020-01-20 MED ORDER — DEXAMETHASONE SODIUM PHOSPHATE 10 MG/ML IJ SOLN: 8.0000 mg | Freq: Once | INTRAMUSCULAR | Status: AC

## 2020-01-20 MED ORDER — PROPOFOL 10 MG/ML IV BOLUS
INTRAVENOUS | Status: AC
  Filled 2020-01-20: qty 20

## 2020-01-20 MED ORDER — ACETAMINOPHEN 10 MG/ML IV SOLN
INTRAVENOUS | Status: AC
  Filled 2020-01-20: qty 100

## 2020-01-20 MED ORDER — CHLORHEXIDINE GLUCONATE 0.12 % MT SOLN: 15.0000 mL | Freq: Once | OROMUCOSAL | Status: AC

## 2020-01-20 MED ORDER — FENTANYL CITRATE (PF) 100 MCG/2ML IJ SOLN
INTRAMUSCULAR | Status: AC
Start: 2020-01-20 — End: 2020-01-20
  Administered 2020-01-20: 25 ug via INTRAVENOUS
  Filled 2020-01-20: qty 2

## 2020-01-20 MED ORDER — MORPHINE SULFATE (PF) 4 MG/ML IV SOLN: 1.0000 mg | INTRAVENOUS | Status: DC | PRN

## 2020-01-20 MED ORDER — ENOXAPARIN SODIUM 40 MG/0.4ML ~~LOC~~ SOLN: 40.0000 mg | SUBCUTANEOUS | Status: DC

## 2020-01-20 MED ORDER — CEFAZOLIN SODIUM-DEXTROSE 2-4 GM/100ML-% IV SOLN
2.0000 g | INTRAVENOUS | Status: AC
  Administered 2020-01-20: 2 g via INTRAVENOUS

## 2020-01-20 MED ORDER — ONDANSETRON HCL 4 MG/2ML IJ SOLN: 4.0000 mg | Freq: Once | INTRAMUSCULAR | Status: DC | PRN

## 2020-01-20 MED ORDER — MORPHINE SULFATE (PF) 4 MG/ML IV SOLN: 1.0000 mg | INTRAVENOUS | Status: DC

## 2020-01-20 MED ORDER — ALUM & MAG HYDROXIDE-SIMETH 200-200-20 MG/5ML PO SUSP: 30.0000 mL | ORAL | Status: DC | PRN

## 2020-01-20 MED ORDER — MORPHINE SULFATE (PF) 2 MG/ML IV SOLN
INTRAVENOUS | Status: AC
  Filled 2020-01-20: qty 1

## 2020-01-20 MED ORDER — KETAMINE HCL 50 MG/ML IJ SOLN
INTRAMUSCULAR | Status: DC | PRN
  Administered 2020-01-20 (×2): 10 mg via INTRAMUSCULAR
  Administered 2020-01-20: 25 mg via INTRAMUSCULAR

## 2020-01-20 MED ORDER — DIPHENHYDRAMINE HCL 50 MG/ML IJ SOLN: 25.0000 mg | Freq: Once | INTRAMUSCULAR | Status: AC

## 2020-01-20 MED ORDER — TRAMADOL HCL 50 MG PO TABS: 50.0000 mg | ORAL_TABLET | Freq: Four times a day (QID) | ORAL | 0 refills | Status: DC | PRN

## 2020-01-20 MED ORDER — TRAMADOL HCL 50 MG PO TABS: 50.0000 mg | ORAL_TABLET | ORAL | Status: DC | PRN

## 2020-01-20 MED ORDER — FLEET ENEMA 7-19 GM/118ML RE ENEM: 1.0000 | ENEMA | Freq: Once | RECTAL | Status: DC | PRN

## 2020-01-20 MED ORDER — BUPIVACAINE HCL (PF) 0.5 % IJ SOLN
INTRAMUSCULAR | Status: DC | PRN
  Administered 2020-01-20: 2.5 mL

## 2020-01-20 MED ORDER — ACETAMINOPHEN 10 MG/ML IV SOLN
1000.0000 mg | Freq: Four times a day (QID) | INTRAVENOUS | Status: DC
  Administered 2020-01-20: 1000 mg via INTRAVENOUS

## 2020-01-20 MED ORDER — GABAPENTIN 300 MG PO CAPS
ORAL_CAPSULE | ORAL | Status: AC
  Filled 2020-01-20: qty 1

## 2020-01-20 MED ORDER — HYDROMORPHONE HCL 1 MG/ML IJ SOLN: 0.5000 mg | INTRAMUSCULAR | Status: DC | PRN

## 2020-01-20 MED ORDER — LACTATED RINGERS IV SOLN: INTRAVENOUS | Status: DC

## 2020-01-20 MED ORDER — FAMOTIDINE 20 MG PO TABS: 20.0000 mg | ORAL_TABLET | Freq: Once | ORAL | Status: AC

## 2020-01-20 MED ORDER — ACETAMINOPHEN 325 MG PO TABS: 325.0000 mg | ORAL_TABLET | Freq: Four times a day (QID) | ORAL | Status: DC | PRN

## 2020-01-20 MED ORDER — CELECOXIB 200 MG PO CAPS: 400.0000 mg | ORAL_CAPSULE | Freq: Once | ORAL | Status: AC

## 2020-01-20 MED ORDER — NEOMYCIN-POLYMYXIN B GU 40-200000 IR SOLN
Status: DC | PRN
  Administered 2020-01-20: 16 mL

## 2020-01-20 MED ORDER — CHLORHEXIDINE GLUCONATE 0.12 % MT SOLN
OROMUCOSAL | Status: AC
  Administered 2020-01-20: 15 mL via OROMUCOSAL
  Filled 2020-01-20: qty 15

## 2020-01-20 MED ORDER — FAMOTIDINE 20 MG PO TABS
ORAL_TABLET | ORAL | Status: AC
  Administered 2020-01-20: 20 mg via ORAL
  Filled 2020-01-20: qty 1

## 2020-01-20 MED ORDER — CELECOXIB 200 MG PO CAPS
ORAL_CAPSULE | ORAL | Status: AC
  Administered 2020-01-20: 400 mg via ORAL
  Filled 2020-01-20: qty 2

## 2020-01-20 MED ORDER — ENSURE PRE-SURGERY PO LIQD
296.0000 mL | Freq: Once | ORAL | Status: DC
  Filled 2020-01-20: qty 296

## 2020-01-20 MED ORDER — MENTHOL 3 MG MT LOZG
1.0000 | LOZENGE | OROMUCOSAL | Status: DC | PRN
  Filled 2020-01-20: qty 9

## 2020-01-20 MED ORDER — LACTATED RINGERS IV BOLUS
250.0000 mL | Freq: Once | INTRAVENOUS | Status: AC
  Administered 2020-01-20: 250 mL via INTRAVENOUS

## 2020-01-20 MED ORDER — BUPIVACAINE HCL (PF) 0.5 % IJ SOLN
INTRAMUSCULAR | Status: AC
  Filled 2020-01-20: qty 30

## 2020-01-20 MED ORDER — PHENYLEPHRINE HCL-NACL 10-0.9 MG/250ML-% IV SOLN
INTRAVENOUS | Status: DC | PRN
  Administered 2020-01-20: 30 ug/min via INTRAVENOUS

## 2020-01-20 MED ORDER — MIDAZOLAM HCL 5 MG/5ML IJ SOLN
INTRAMUSCULAR | Status: DC | PRN
  Administered 2020-01-20 (×2): .5 mg via INTRAVENOUS

## 2020-01-20 MED ORDER — MORPHINE SULFATE (PF) 4 MG/ML IV SOLN
INTRAVENOUS | Status: AC
  Administered 2020-01-20: 1 mg via INTRAVENOUS
  Filled 2020-01-20: qty 1

## 2020-01-20 MED ORDER — CELECOXIB 200 MG PO CAPS: 200.0000 mg | ORAL_CAPSULE | Freq: Two times a day (BID) | ORAL | 2 refills | Status: AC

## 2020-01-20 MED ORDER — FENTANYL CITRATE (PF) 100 MCG/2ML IJ SOLN
INTRAMUSCULAR | Status: AC
  Filled 2020-01-20: qty 2

## 2020-01-20 MED ORDER — ONDANSETRON HCL 4 MG PO TABS: 4.0000 mg | ORAL_TABLET | Freq: Four times a day (QID) | ORAL | Status: DC | PRN

## 2020-01-20 MED ORDER — OXYCODONE HCL 5 MG PO TABS
ORAL_TABLET | ORAL | Status: AC
  Filled 2020-01-20: qty 1

## 2020-01-20 MED ORDER — TRANEXAMIC ACID-NACL 1000-0.7 MG/100ML-% IV SOLN: 1000.0000 mg | Freq: Once | INTRAVENOUS | Status: AC

## 2020-01-20 MED ORDER — DIPHENHYDRAMINE HCL 12.5 MG/5ML PO ELIX
12.5000 mg | ORAL_SOLUTION | ORAL | Status: DC | PRN
  Filled 2020-01-20: qty 10

## 2020-01-20 MED ORDER — PHENYLEPHRINE HCL (PRESSORS) 10 MG/ML IV SOLN
INTRAVENOUS | Status: DC | PRN
  Administered 2020-01-20 (×2): 200 ug via INTRAVENOUS

## 2020-01-20 MED ORDER — LACTATED RINGERS IV BOLUS
500.0000 mL | Freq: Once | INTRAVENOUS | Status: AC
  Administered 2020-01-20: 500 mL via INTRAVENOUS

## 2020-01-20 MED ORDER — CHLORHEXIDINE GLUCONATE 4 % EX LIQD: 60.0000 mL | Freq: Once | CUTANEOUS | Status: DC

## 2020-01-20 MED ORDER — ORAL CARE MOUTH RINSE: 15.0000 mL | Freq: Once | OROMUCOSAL | Status: AC

## 2020-01-20 MED ORDER — MIDAZOLAM HCL 2 MG/2ML IJ SOLN
INTRAMUSCULAR | Status: AC
  Filled 2020-01-20: qty 2

## 2020-01-20 MED ORDER — FENTANYL CITRATE (PF) 100 MCG/2ML IJ SOLN
INTRAMUSCULAR | Status: DC | PRN
Start: 2020-01-20 — End: 2020-01-20
  Administered 2020-01-20 (×4): 25 ug via INTRAVENOUS

## 2020-01-20 MED ORDER — ENOXAPARIN SODIUM 40 MG/0.4ML ~~LOC~~ SOLN: 40.0000 mg | SUBCUTANEOUS | 0 refills | Status: DC

## 2020-01-20 MED ORDER — TRANEXAMIC ACID-NACL 1000-0.7 MG/100ML-% IV SOLN
INTRAVENOUS | Status: AC
  Filled 2020-01-20: qty 100

## 2020-01-20 MED ORDER — PANTOPRAZOLE SODIUM 40 MG PO TBEC
40.0000 mg | DELAYED_RELEASE_TABLET | Freq: Two times a day (BID) | ORAL | Status: DC
  Filled 2020-01-20: qty 1

## 2020-01-20 MED ORDER — DIPHENHYDRAMINE HCL 50 MG/ML IJ SOLN
INTRAMUSCULAR | Status: AC
  Administered 2020-01-20: 25 mg via INTRAVENOUS
  Filled 2020-01-20: qty 1

## 2020-01-20 MED ORDER — PROPOFOL 500 MG/50ML IV EMUL
INTRAVENOUS | Status: DC | PRN
  Administered 2020-01-20: 125 ug/kg/min via INTRAVENOUS

## 2020-01-20 MED ORDER — HYDROMORPHONE HCL 1 MG/ML IJ SOLN
INTRAMUSCULAR | Status: AC
Start: 2020-01-20 — End: 2020-01-20
  Administered 2020-01-20: 0.25 mg via INTRAVENOUS
  Filled 2020-01-20: qty 1

## 2020-01-20 MED ORDER — FERROUS SULFATE 325 (65 FE) MG PO TABS
325.0000 mg | ORAL_TABLET | Freq: Two times a day (BID) | ORAL | Status: DC
  Filled 2020-01-20: qty 1

## 2020-01-20 MED ORDER — TRANEXAMIC ACID-NACL 1000-0.7 MG/100ML-% IV SOLN
INTRAVENOUS | Status: AC
  Administered 2020-01-20: 1000 mg via INTRAVENOUS
  Filled 2020-01-20: qty 100

## 2020-01-20 MED ORDER — BISACODYL 10 MG RE SUPP
10.0000 mg | Freq: Every day | RECTAL | Status: DC | PRN
  Filled 2020-01-20: qty 1

## 2020-01-20 MED ORDER — FENTANYL CITRATE (PF) 100 MCG/2ML IJ SOLN
25.0000 ug | INTRAMUSCULAR | Status: AC | PRN
  Administered 2020-01-20 (×6): 25 ug via INTRAVENOUS

## 2020-01-20 MED ORDER — DEXAMETHASONE SODIUM PHOSPHATE 10 MG/ML IJ SOLN
INTRAMUSCULAR | Status: AC
  Administered 2020-01-20: 8 mg via INTRAVENOUS
  Filled 2020-01-20: qty 1

## 2020-01-20 MED ORDER — HYDROMORPHONE HCL 1 MG/ML IJ SOLN
0.2500 mg | INTRAMUSCULAR | Status: DC | PRN
  Administered 2020-01-20: 0.25 mg via INTRAVENOUS

## 2020-01-20 MED ORDER — SURGIRINSE WOUND IRRIGATION SYSTEM - OPTIME
TOPICAL | Status: DC | PRN
  Administered 2020-01-20: 450 mL via TOPICAL

## 2020-01-20 MED ORDER — PHENOL 1.4 % MT LIQD
1.0000 | OROMUCOSAL | Status: DC | PRN
  Filled 2020-01-20: qty 177

## 2020-01-20 MED ORDER — TRANEXAMIC ACID-NACL 1000-0.7 MG/100ML-% IV SOLN
1000.0000 mg | INTRAVENOUS | Status: AC
  Administered 2020-01-20: 1000 mg via INTRAVENOUS

## 2020-01-20 MED ORDER — CEFAZOLIN SODIUM-DEXTROSE 2-4 GM/100ML-% IV SOLN
2.0000 g | Freq: Four times a day (QID) | INTRAVENOUS | Status: DC
  Administered 2020-01-20: 2 g via INTRAVENOUS

## 2020-01-20 MED ORDER — MAGNESIUM HYDROXIDE 400 MG/5ML PO SUSP
30.0000 mL | Freq: Every day | ORAL | Status: DC
  Filled 2020-01-20: qty 30

## 2020-01-20 SURGICAL SUPPLY — 63 items
BLADE DRUM FLTD (BLADE) ×2 IMPLANT
BLADE SAW 90X25X1.19 OSCILLAT (BLADE) ×2 IMPLANT
CANISTER PREVENA 45 (CANNISTER) ×1 IMPLANT
CANISTER SUCT 1200ML W/VALVE (MISCELLANEOUS) ×2 IMPLANT
CANISTER SUCT 3000ML PPV (MISCELLANEOUS) ×4 IMPLANT
CARTRIDGE OIL MAESTRO DRILL (MISCELLANEOUS) ×1 IMPLANT
COVER WAND RF STERILE (DRAPES) ×2 IMPLANT
CUP ACETBLR 48 OD 100 SERIES (Hips) ×1 IMPLANT
DIFFUSER DRILL AIR PNEUMATIC (MISCELLANEOUS) ×2 IMPLANT
DRAPE 3/4 80X56 (DRAPES) ×2 IMPLANT
DRAPE INCISE IOBAN 66X60 STRL (DRAPES) ×2 IMPLANT
DRSG DERMACEA 8X12 NADH (GAUZE/BANDAGES/DRESSINGS) ×1 IMPLANT
DRSG MEPILEX SACRM 8.7X9.8 (GAUZE/BANDAGES/DRESSINGS) ×2 IMPLANT
DRSG OPSITE POSTOP 4X12 (GAUZE/BANDAGES/DRESSINGS) ×1 IMPLANT
DRSG OPSITE POSTOP 4X14 (GAUZE/BANDAGES/DRESSINGS) IMPLANT
DRSG TEGADERM 4X4.75 (GAUZE/BANDAGES/DRESSINGS) ×1 IMPLANT
DURAPREP 26ML APPLICATOR (WOUND CARE) ×2 IMPLANT
ELECT REM PT RETURN 9FT ADLT (ELECTROSURGICAL) ×2
ELECTRODE REM PT RTRN 9FT ADLT (ELECTROSURGICAL) ×1 IMPLANT
GLOVE BIO SURGEON STRL SZ7.5 (GLOVE) ×4 IMPLANT
GLOVE BIOGEL M STRL SZ7.5 (GLOVE) ×4 IMPLANT
GLOVE BIOGEL PI IND STRL 7.5 (GLOVE) ×1 IMPLANT
GLOVE BIOGEL PI INDICATOR 7.5 (GLOVE) ×1
GLOVE INDICATOR 8.0 STRL GRN (GLOVE) ×2 IMPLANT
GOWN STRL REUS W/ TWL LRG LVL3 (GOWN DISPOSABLE) ×2 IMPLANT
GOWN STRL REUS W/ TWL XL LVL3 (GOWN DISPOSABLE) ×1 IMPLANT
GOWN STRL REUS W/TWL LRG LVL3 (GOWN DISPOSABLE) ×2
GOWN STRL REUS W/TWL XL LVL3 (GOWN DISPOSABLE) ×1
HEAD FEM STD 32X+1 STRL (Hips) ×1 IMPLANT
HEMOVAC 400CC 10FR (MISCELLANEOUS) ×1 IMPLANT
HOLDER FOLEY CATH W/STRAP (MISCELLANEOUS) ×2 IMPLANT
HOOD PEEL AWAY FLYTE STAYCOOL (MISCELLANEOUS) ×4 IMPLANT
IRRIGATION SURGIPHOR STRL (IV SOLUTION) ×2 IMPLANT
KIT PREVENA INCISION MGT20CM45 (CANNISTER) ×1 IMPLANT
KIT TURNOVER KIT A (KITS) ×2 IMPLANT
LINER ACET 32X48 (Liner) ×1 IMPLANT
MANIFOLD NEPTUNE II (INSTRUMENTS) ×2 IMPLANT
NDL SAFETY ECLIPSE 18X1.5 (NEEDLE) ×1 IMPLANT
NEEDLE HYPO 18GX1.5 SHARP (NEEDLE) ×1
NS IRRIG 500ML POUR BTL (IV SOLUTION) ×2 IMPLANT
OIL CARTRIDGE MAESTRO DRILL (MISCELLANEOUS) ×2
PACK HIP PROSTHESIS (MISCELLANEOUS) ×2 IMPLANT
PENCIL SMOKE ULTRAEVAC 22 CON (MISCELLANEOUS) ×2 IMPLANT
PIN STEIN THRED 5/32 (Pin) ×2 IMPLANT
PULSAVAC PLUS IRRIG FAN TIP (DISPOSABLE) ×2
SOL .9 NS 3000ML IRR  AL (IV SOLUTION) ×1
SOL .9 NS 3000ML IRR UROMATIC (IV SOLUTION) ×1 IMPLANT
SOL PREP PVP 2OZ (MISCELLANEOUS) ×2
SOLUTION PREP PVP 2OZ (MISCELLANEOUS) ×1 IMPLANT
SPONGE DRAIN TRACH 4X4 STRL 2S (GAUZE/BANDAGES/DRESSINGS) ×1 IMPLANT
STAPLER SKIN PROX 35W (STAPLE) ×2 IMPLANT
STEM AML 12X155X30 STD SM 6IN (Joint) ×1 IMPLANT
SUT ETHIBOND #5 BRAIDED 30INL (SUTURE) ×2 IMPLANT
SUT VIC AB 0 CT1 36 (SUTURE) ×2 IMPLANT
SUT VIC AB 1 CT1 36 (SUTURE) ×4 IMPLANT
SUT VIC AB 2-0 CT1 27 (SUTURE) ×1
SUT VIC AB 2-0 CT1 TAPERPNT 27 (SUTURE) ×1 IMPLANT
SYR 20ML LL LF (SYRINGE) ×2 IMPLANT
TAPE CLOTH 3X10 WHT NS LF (GAUZE/BANDAGES/DRESSINGS) ×2 IMPLANT
TAPE TRANSPORE STRL 2 31045 (GAUZE/BANDAGES/DRESSINGS) ×2 IMPLANT
TIP FAN IRRIG PULSAVAC PLUS (DISPOSABLE) ×1 IMPLANT
TOWEL OR 17X26 4PK STRL BLUE (TOWEL DISPOSABLE) ×2 IMPLANT
TRAY FOLEY MTR SLVR 16FR STAT (SET/KITS/TRAYS/PACK) ×2 IMPLANT

## 2020-01-20 NOTE — Op Note (Signed)
OPERATIVE NOTE  DATE OF SURGERY:  01/20/2020  PATIENT NAME:  Sheila Rivas   DOB: 1950-12-30  MRN: [id number]  PRE-OPERATIVE DIAGNOSIS: Degenerative arthrosis of the left hip, primary  POST-OPERATIVE DIAGNOSIS:  Same  PROCEDURE:  Left total hip arthroplasty  SURGEON:  Jena Gauss. M.D.  ASSISTANT: Baldwin Jamaica, PA-C (present and scrubbed throughout the case, critical for assistance with exposure, retraction, instrumentation, and closure)  ANESTHESIA: spinal  ESTIMATED BLOOD LOSS: 50 mL  FLUIDS REPLACED: 900 mL of crystalloid  DRAINS: Wound VAC  IMPLANTS UTILIZED: DePuy 12 mm small stature AML femoral stem, 48 mm OD Pinnacle 100 acetabular component, neutral Pinnacle Altrx polyethylene insert, and a 32 mm CoCr +1 mm hip ball  INDICATIONS FOR SURGERY: Sheila Rivas is a 69 y.o. year old female with a long history of progressive hip and groin  pain. X-rays demonstrated severe degenerative changes. The patient had not seen any significant improvement despite conservative nonsurgical intervention. After discussion of the risks and benefits of surgical intervention, the patient expressed understanding of the risks benefits and agree with plans for total hip arthroplasty.   The risks, benefits, and alternatives were discussed at length including but not limited to the risks of infection, bleeding, nerve injury, stiffness, blood clots, the need for revision surgery, limb length inequality, dislocation, cardiopulmonary complications, among others, and they were willing to proceed.  PROCEDURE IN DETAIL: The patient was brought into the operating room and, after adequate spinal anesthesia was achieved, the patient was placed in a right lateral decubitus position. Axillary roll was placed and all bony prominences were well-padded. The patient's left hip was cleaned and prepped with alcohol and DuraPrep and draped in the usual sterile fashion. A "timeout" was performed as per usual protocol.  A lateral curvilinear incision was made gently curving towards the posterior superior iliac spine. The IT band was incised in line with the skin incision and the fibers of the gluteus maximus were split in line. The piriformis tendon was identified, skeletonized, and incised at its insertion to the proximal femur and reflected posteriorly. A T type posterior capsulotomy was performed. Prior to dislocation of the femoral head, a threaded Steinmann pin was inserted through a separate stab incision into the pelvis superior to the acetabulum and bent in the form of a stylus so as to assess limb length and hip offset throughout the procedure. The femoral head was then dislocated posteriorly. Inspection of the femoral head demonstrated severe degenerative changes with full-thickness loss of articular cartilage. The femoral neck cut was performed using an oscillating saw. The anterior capsule was elevated off of the femoral neck using a periosteal elevator. Attention was then directed to the acetabulum. The remnant of the labrum was excised using electrocautery. Inspection of the acetabulum also demonstrated significant degenerative changes. The acetabulum was reamed in sequential fashion up to a 47 mm diameter. Good punctate bleeding bone was encountered. A 48 mm Pinnacle 100 acetabular component was positioned and impacted into place. Good scratch fit was appreciated. A neutral polyethylene trial was inserted.  Attention was then directed to the proximal femur. A hole for reaming of the proximal femoral canal was created using a high-speed burr. The femoral canal was reamed in sequential fashion up to a 11.5 mm diameter. This allowed for approximately 7 cm of scratch fit. Serial broaches were inserted up to a 12 mm small stature femoral broach. Calcar region was planed and a trial reduction was performed using a 32 mm hip ball with a +  1 mm neck length. Good equalization of limb lengths and hip offset was appreciated  and excellent stability was noted both anteriorly and posteriorly. Trial components were removed. The acetabular shell was irrigated with copious amounts of normal saline with antibiotic solution and suctioned dry. A neutral Pinnacle Altrx polyethylene insert was positioned and impacted into place. Next, a 12 mm small stature AML femoral stem was positioned and impacted into place. Excellent scratch fit was appreciated. A trial reduction was again performed with a 32 mm hip ball with a +1 mm neck length. Again, good equalization of limb lengths was appreciated and excellent stability appreciated both anteriorly and posteriorly. The hip was then dislocated and the trial hip ball was removed. The Morse taper was cleaned and dried. A 32 mm cobalt chromium hip ball with a S1 mm neck length was placed on the trunnion and impacted into place. The hip was then reduced and placed through range of motion. Excellent stability was appreciated both anteriorly and posteriorly.  The wound was irrigated with copious amounts of normal saline followed by 500 ml of Surgiphor and suctioned dry. Good hemostasis was appreciated. The posterior capsulotomy was repaired using #5 Ethibond. Piriformis tendon was reapproximated to the undersurface of the gluteus medius tendon using #5 Ethibond. The IT band was reapproximated using interrupted sutures of #1 Vicryl. Subcutaneous tissue was approximated using first #0 Vicryl followed by #2-0 Vicryl. The skin was closed with skin staples.  A wound VAC and sterile dressing were applied.  The patient tolerated the procedure well and was transported to the recovery room in stable condition.   Jena Gauss., M.D.

## 2020-01-20 NOTE — Evaluation (Signed)
Occupational Therapy Evaluation Patient Details Name: Sheila Rivas MRN: [id number] DOB: 1951-01-27 Today's Date: 01/20/2020    History of Present Illness Sheila Rivas is a 69 y.o. female with dx of Degenerative arthrosis of the left hip, primary and is s/p elective L THA. Posterior hip precautions are present.   Clinical Impression   Sheila Rivas was seen for OT evaluation this date, POD#0 from above surgery. Pt was independent in all ADLs prior to surgery, however, she reports self-limiting her activity/mobility 2/2 L hip pain. Pt states that despite being limited, she assisted in the care of her 4 grandchildren (all 59 years old or younger), managed grocery shopping, and was independent for bathing, dressing, and household mobility. Pt is eager to return to PLOF with less pain and improved safety and independence. Pt currently requires minimal assist for LB dressing while in seated position due to pain and limited AROM of L hip. Pt able to recall 2/3 posterior total hip precautions at start of session and unable to verbalize how to implement during ADL and mobility. Pt instructed in posterior total hip precautions and how to implement, self care skills, falls prevention strategies, home/routines modifications, DME/AE for LB bathing and dressing tasks, compression stocking mgt strategies, and car transfer techniques. At end of session, pt able to recall 3/3 posterior total hip precautions. Pt would benefit from additional instruction in self care skills and techniques to help maintain precautions with or without assistive devices to support recall and carryover prior to discharge. Recommend HHOT upon discharge to maximize pt safety and functional return to meaningful occupations of daily life.      Follow Up Recommendations  Home health OT    Equipment Recommendations  3 in 1 bedside commode    Recommendations for Other Services       Precautions / Restrictions Precautions Precautions:  Posterior Hip Precaution Booklet Issued: Yes (comment) Precaution Comments: L THA posterior Restrictions Weight Bearing Restrictions: Yes LLE Weight Bearing: Weight bearing as tolerated      Mobility Bed Mobility Overal bed mobility: Modified Independent             General bed mobility comments: Deferred. Pt in recliner at start/end of sesison.  Transfers Overall transfer level: Needs assistance Equipment used: 1 person hand held assist Transfers: Sit to/from Stand Sit to Stand: Min guard         General transfer comment: Requires cues for sequencing STS with adherence to L THPs    Balance Overall balance assessment: Needs assistance Sitting-balance support: No upper extremity supported;Feet unsupported Sitting balance-Leahy Scale: Normal Sitting balance - Comments: Steady static/dynamic sitting with no LOB appreciated with functional tasks this date.   Standing balance support: During functional activity;Single extremity supported Standing balance-Leahy Scale: Good Standing balance comment: Steady static standing with 1 UE support during functional task.                           ADL either performed or assessed with clinical judgement   ADL Overall ADL's : Needs assistance/impaired                                       General ADL Comments: Pt limited by increased pain and decreased AROM of her LLE. She has posterior hip precuations which further limit ADL management. MIN A for LB dressing and bathing in a seated position. MOD/MAX  A to don compression stockings. CGA for functional mobility.     Vision Baseline Vision/History: Wears glasses Wears Glasses: At all times Patient Visual Report: No change from baseline       Perception     Praxis      Pertinent Vitals/Pain Pain Assessment: 0-10 Pain Score: 5  Pain Location: L Hip, pain 5/10 post ambulation Pain Descriptors / Indicators: Sore Pain Intervention(s): Limited  activity within patient's tolerance;Monitored during session;Repositioned     Hand Dominance     Extremity/Trunk Assessment Upper Extremity Assessment Upper Extremity Assessment: Overall WFL for tasks assessed   Lower Extremity Assessment Lower Extremity Assessment: LLE deficits/detail LLE Deficits / Details: s/p L THA with posterior THPs   Cervical / Trunk Assessment Cervical / Trunk Assessment: Normal   Communication Communication Communication: No difficulties   Cognition Arousal/Alertness: Awake/alert Behavior During Therapy: WFL for tasks assessed/performed Overall Cognitive Status: Within Functional Limits for tasks assessed                                     General Comments       Exercises Total Joint Exercises Ankle Circles/Pumps: AROM;Both;10 reps Quad Sets: AROM;Both;10 reps Gluteal Sets: AROM;10 reps Long Arc Quad: AROM;Both;10 reps Marching in Standing: AROM;Both;5 reps Other Exercises Other Exercises: Pt educated on role of OT in acute setting, posterior THP's (Handout provided), considerations for ADL management in context of posterior THPs, safe positioning for sleep, safe use of AE/DME for ADL management, pet mangement strategies, and routines modifications to support safety and functional independence upon hospital DC. Other Exercises: OT facilitates seated LB ADL dressing using LH reacher and sock aid with consistent education on posterior THPs and cueing for technique. Other Exercises: pt educated on sequencing for L hand turns to avoid breaking hip precautions Other Exercises: pt educated on sequencing for STS and car transfer with posterior hip precautions Other Exercises: pt educated on sequencing for acsending/ descending stairs   Shoulder Instructions      Home Living Family/patient expects to be discharged to:: Private residence Living Arrangements: Children;Other relatives Available Help at Discharge: Family;Available 24  hours/day Type of Home: House Home Access: Stairs to enter [employer] of Steps: 3 Entrance Stairs-Rails: None Home Layout: One level               Home Equipment: Bedside commode;Walker - 2 wheels          Prior Functioning/Environment Level of Independence: Independent        Comments: Pt reports she was generally independent, but limited 2/2 L hip pain. She assists with care of her 4 grandchildren, and is independent for BADL management. She denies use of AE at baseline.        OT Problem List: Decreased strength;Decreased coordination;Pain;Decreased range of motion;Decreased safety awareness;Decreased knowledge of use of DME or AE;Decreased activity tolerance;Impaired balance (sitting and/or standing);Decreased knowledge of precautions      OT Treatment/Interventions: Self-care/ADL training;Therapeutic activities;Therapeutic exercise;Energy conservation;DME and/or AE instruction;Patient/family education;Balance training    OT Goals(Current goals can be found in the care plan section) Acute Rehab OT Goals Patient Stated Goal: pt wants to get stronger so pt can return to activities that she likes to do such as shopping OT Goal Formulation: With patient Time For Goal Achievement: 02/03/20 Potential to Achieve Goals: Good ADL Goals Pt Will Perform Grooming: with modified independence;sitting (c LRAD PRN for improved safety and functional indep.)  Pt Will Transfer to Toilet: bedside commode;ambulating;with modified independence (c LRAD PRN for improved safety and functional indep.) Pt Will Perform Toileting - Clothing Manipulation and hygiene: with adaptive equipment;with modified independence;sit to/from stand (c LRAD PRN for improved safety and functional indep.)  OT Frequency: Min 1X/week   Barriers to D/C:            Co-evaluation              AM-PAC OT "6 Clicks" Daily Activity     Outcome Measure Help from another person eating meals?:  None Help from another person taking care of personal grooming?: A Little Help from another person toileting, which includes using toliet, bedpan, or urinal?: A Little Help from another person bathing (including washing, rinsing, drying)?: A Little Help from another person to put on and taking off regular upper body clothing?: A Little Help from another person to put on and taking off regular lower body clothing?: A Little 6 Click Score: 19   End of Session Equipment Utilized During Treatment: Gait belt;Rolling walker Nurse Communication: Mobility status;Other (comment) (Pt resting comfortably, done with therapy evaluations.)  Activity Tolerance: Patient tolerated treatment well Patient left: in chair;with call bell/phone within reach (Pt rec'd in PACU)  OT Visit Diagnosis: Other abnormalities of gait and mobility (R26.89);Pain Pain - Right/Left: Left Pain - part of body: Hip                Time: 4496-7591 OT Time Calculation (min): 30 min Charges:  OT General Charges $OT Visit: 1 Visit OT Evaluation $OT Eval Low Complexity: 1 Low OT Treatments $Self Care/Home Management : 23-37 mins  Rockney Ghee, M.S., OTR/L Ascom: [phone] 01/20/20, 3:56 PM

## 2020-01-20 NOTE — Progress Notes (Signed)
Informed Dr. Marry Guan pt VSS, NAD, pain tolerable, all ABX admin,  met with PT/OT, denies N/V, voided per protocol.  Dr. Marry Guan stated ok to d/c home.

## 2020-01-20 NOTE — Transfer of Care (Signed)
Immediate Anesthesia Transfer of Care Note  Patient: Sheila Rivas  Procedure(s) Performed: TOTAL HIP ARTHROPLASTY (Left Hip)  Patient Location: PACU  Anesthesia Type:Spinal  Level of Consciousness: awake, alert  and oriented  Airway & Oxygen Therapy: Patient Spontanous Breathing and Patient connected to face mask oxygen  Post-op Assessment: Report given to RN and Post -op Vital signs reviewed and stable  Post vital signs: stable  Last Vitals:  Vitals Value Taken Time  BP 113/57 01/20/20 1030  Temp    Pulse 80 01/20/20 1033  Resp 17 01/20/20 1033  SpO2 99 % 01/20/20 1033  Vitals shown include unvalidated device data.  Last Pain:  Vitals:   01/20/20 0631  TempSrc: Tympanic  PainSc: 0-No pain         Complications: No complications documented.

## 2020-01-20 NOTE — Evaluation (Signed)
Physical Therapy Evaluation Patient Details Name: Sheila Rivas MRN: [id number] DOB: [birth year] Today's Date: 01/20/2020   History of Present Illness  Sheila Rivas is a 69 y.o. female with dx of Degenerative arthrosis of the left hip, primary and is s/p elective L THA. Posterior hip precautions are present. PMH includes  Chronic low back pain without sciatica, DDD, Mild multilevel DDD, GERD, Hypertension, Mild intermittent asthma, Osteoarthritis of hips bilateral,  Osteopenia  Clinical Impression  Pt was pleasant and motivated to participate during the session. Pr reported that their hip was 2/10 pain but that overall it felt somewhat better than pre-surgery.  Pt was educated on posterior hip precautions and verbalized understanding. Pt was educated on proper sequencing for car transfers and verbalized understanding of sequencing. Pt was educated on sequencing for STS with posterior hip precautions and L hand turns. Pt was able to perform correctly with RW, min guard and moderate cueing. Pt was able to ambulate 100' with RW and min guard. Pt had some mild fatigue but showed steady balance and overall good gait mechanics. Pt was educated on stair sequencing for ascending/ descending and was able to perform with Min A for walker management and required Max cueing to complete. Pt reported that pt did not feel entirely comfortable with the sequence but declined further practice. Family was contacted and educated on sequencing to assist pt when they return home. Pt's HR and SpO2 were WFL throughout the evaluation. Pt will benefit from HHPT services upon discharge to safely address deficits listed in patient problem list for decreased caregiver assistance and eventual return to PLOF.     Follow Up Recommendations Home health PT    Equipment Recommendations  None recommended by PT    Recommendations for Other Services       Precautions / Restrictions Precautions Precautions: Posterior  Hip Precaution Booklet Issued: Yes (comment) Precaution Comments: L THA posterior Restrictions Weight Bearing Restrictions: Yes LLE Weight Bearing: Weight bearing as tolerated      Mobility  Bed Mobility Overal bed mobility: Modified Independent              Transfers Overall transfer level: Needs assistance Equipment used: RW  Transfers: Sit to/from Stand Sit to Stand: Min guard         General transfer comment: Requires cues for sequencing STS with adherence to L THPs  Ambulation/Gait Ambulation/Gait assistance: Min guard Gait Distance (Feet): 100 Feet Assistive device: Rolling walker (2 wheeled) Gait Pattern/deviations: Antalgic;Step-through pattern        Stairs            Wheelchair Mobility    Modified Rankin (Stroke Patients Only)       Balance Overall balance assessment: Needs assistance Sitting-balance support: No upper extremity supported;Feet unsupported Sitting balance-Leahy Scale: Normal Sitting balance - Comments: Steady static/dynamic sitting with no LOB appreciated with functional tasks this date.   Standing balance support: During functional activity;Single extremity supported Standing balance-Leahy Scale: Good Standing balance comment: Steady static standing with 1 UE support during functional task.                             Pertinent Vitals/Pain Pain Assessment: 0-10 Pain Score: 5  Pain Location: L Hip, pain 5/10 post ambulation Pain Descriptors / Indicators: Sore Pain Intervention(s): Limited activity within patient's tolerance;Monitored during session;Repositioned    Home Living Family/patient expects to be discharged to:: Private residence Living Arrangements: Children;Other relatives Available Help at Discharge:  Family;Available 24 hours/day Type of Home: House Home Access: Stairs to enter Entrance Stairs-Rails: None Entrance Stairs-Number of Steps: 3 Home Layout: One level Home Equipment: Bedside  commode;Walker - 2 wheels      Prior Function Level of Independence: Independent         Comments: Pt reports she was generally independent, but limited 2/2 L hip pain. She assists with care of her 4 grandchildren, and is independent for BADL management. She denies use of AE at baseline.     Hand Dominance        Communication   Communication: No difficulties  Cognition Arousal/Alertness: Awake/alert Behavior During Therapy: WFL for tasks assessed/performed Overall Cognitive Status: Within Functional Limits for tasks assessed                                        General Comments      Exercises Total Joint Exercises Ankle Circles/Pumps: AROM;Both;10 reps Quad Sets: AROM;Both;10 reps Gluteal Sets: AROM;10 reps Long Arc Quad: AROM;Both;10 reps Marching in Standing: AROM;Both;5 reps Other Exercises Other Exercises: Pt educated on posterior hip precautions and HEP Other Exercises: pt educated on sequencing for ambulating with walker  Other Exercises: pt educated on sequencing for L hand turns to avoid breaking hip precautions Other Exercises: pt educated on sequencing for STS and car transfer with posterior hip precautions Other Exercises: pt educated on sequencing for acsending/ descending stairs with posterior hip precautions    Assessment/Plan    PT Assessment Patient needs continued PT services  PT Problem List Decreased strength;Decreased mobility;Decreased knowledge of precautions;Decreased range of motion;Decreased activity tolerance;Decreased balance;Decreased knowledge of use of DME       PT Treatment Interventions DME instruction;Therapeutic exercise;Gait training;Balance training;Stair training;Functional mobility training;Therapeutic activities;Patient/family education    PT Goals (Current goals can be found in the Care Plan section)  Acute Rehab PT Goals Patient Stated Goal: pt wants to get stronger so pt can return to activities that she  likes to do such as shopping PT Goal Formulation: With patient Time For Goal Achievement: 02/03/20 Potential to Achieve Goals: Good    Frequency BID   Barriers to discharge        Co-evaluation               AM-PAC PT "6 Clicks" Mobility  Outcome Measure Help needed turning from your back to your side while in a flat bed without using bedrails?: None Help needed moving from lying on your back to sitting on the side of a flat bed without using bedrails?: None Help needed moving to and from a bed to a chair (including a wheelchair)?: A Little Help needed standing up from a chair using your arms (e.g., wheelchair or bedside chair)?: A Little Help needed to walk in hospital room?: A Little Help needed climbing 3-5 steps with a railing? : A Lot 6 Click Score: 19    End of Session Equipment Utilized During Treatment: Gait belt Activity Tolerance: Patient tolerated treatment well Patient left: in chair;with nursing/sitter in room (OT present and ready to begin evaluation) Nurse Communication: Mobility status;Precautions PT Visit Diagnosis: Other abnormalities of gait and mobility (R26.89);Muscle weakness (generalized) (M62.81)    Time: 1349-1450 PT Time Calculation (min) (ACUTE ONLY): 61 min   Charges:              Nicolette Bang, SPT 01/20/20. 4:14 PM

## 2020-01-20 NOTE — OR Nursing (Signed)
PT in for evaluation @ 1400.

## 2020-01-20 NOTE — Discharge Instructions (Addendum)
Instructions after Total Hip Replacement     James P. Angie Fava., M.D.     Dept. of Orthopaedics & Sports Medicine  [facility]  [address]  [place], [place]  [address]  Phone: [phone]   Fax: [phone]    DIET: . Drink plenty of non-alcoholic fluids. . Resume your normal diet. Include foods high in fiber.  ACTIVITY:  . You may use crutches or a walker with weight-bearing as tolerated, unless instructed otherwise. . You may be weaned off of the walker or crutches by your Physical Therapist.  . Do NOT reach below the level of your knees or cross your legs until allowed.    . Continue doing gentle exercises. Exercising will reduce the pain and swelling, increase motion, and prevent muscle weakness.   . Please continue to use the TED compression stockings for 6 weeks. You may remove the stockings at night, but should reapply them in the morning. . Do not drive or operate any equipment until instructed.  WOUND CARE:  . Continue to use ice packs periodically to reduce pain and swelling. Marland Kitchen Keep the incision clean and dry. . You may bathe or shower after the staples are removed at the first office visit following surgery.  MEDICATIONS: . Bonita Quin may resume your regular medications. . Please take the pain medication as prescribed on the medication. . Do not take pain medication on an empty stomach. . You have been given a prescription for a blood thinner to prevent blood clots. Please take the medication as instructed. (NOTE: After completing a 2 week course of Lovenox, take one Enteric-coated aspirin once a day.) . Pain medications and iron supplements can cause constipation. Use a stool softener (Senokot or Colace) on a daily basis and a laxative (dulcolax or miralax) as needed. . Do not drive or drink alcoholic beverages when taking pain medications.  CALL THE OFFICE FOR: . Temperature above 101 degrees . Excessive bleeding or drainage on the dressing. . Excessive  swelling, coldness, or paleness of the toes. . Persistent nausea and vomiting.  FOLLOW-UP:  . You should have an appointment to return to the office in 6 weeks after surgery. . Arrangements have been made for continuation of Physical Therapy (either home therapy or outpatient therapy).     [facility] Department Directory         [email or web address]       [email or web address]          Cardiology  Appointments: [place] - [phone]  Endocrinology  Appointments: [place] [phone] [place] - [phone]  Gastroenterology  Appointments: [place] [phone] [place] - [phone]        General Surgery   Appointments: [facility]  Internal Medicine/Family Medicine  Appointments: [facility] - [phone] [place] - [phone]  Metabolic and Weigh Loss Surgery  Appointments: [facility]        Neurology  Appointments: [place] [phone] [place] - [phone]  Neurosurgery  Appointments: [place]  Obstetrics & Gynecology  Appointments: [place] [phone] [place] - [phone]        Pediatrics  Appointments: [place] [phone] [place] - [phone]  Physiatry  Appointments: [place] [phone]  Physical Therapy  Appointments: [place] - [phone]        Podiatry  Appointments: [place] [phone] [place] - [phone]  Pulmonology  Appointments: [place]  Rheumatology  Appointments: [place] - [phone]        [place] Location: [facility]  [address] [place], [place]  [address]  [place] Location: [facility] [address] [place], [place]  [address]  [place] Location: [facility] [address] [place], [place]  [address]      AMBULATORY SURGERY  DISCHARGE INSTRUCTIONS   1) The drugs that you were  given will stay in your system until tomorrow so for the next 24 hours you should not:  A) Drive an automobile B) Make any legal decisions C) Drink any alcoholic beverage   2) You may resume regular meals tomorrow.  Today it is better to start with liquids and gradually work up to solid foods.  You may eat anything you prefer, but it is better to start with liquids, then soup and crackers, and gradually work up to solid foods.   3) Please notify your doctor immediately if you have any unusual bleeding, trouble breathing, redness and pain at the surgery site, drainage, fever, or pain not relieved by medication.    4) Additional Instructions:        Please contact your physician with any problems or Same Day Surgery at [phone], Monday through Friday 6 am to 4 pm, or [place] at [facility] number at [phone].

## 2020-01-20 NOTE — H&P (Signed)
The patient has been re-examined, and the chart reviewed, and there have been no interval changes to the documented history and physical.    The risks, benefits, and alternatives have been discussed at length. The patient expressed understanding of the risks benefits and agreed with plans for surgical intervention.  Neng Albee P. Doren Kaspar, Jr. M.D.    

## 2020-01-20 NOTE — Anesthesia Preprocedure Evaluation (Signed)
Anesthesia Evaluation  Patient identified by MRN, date of birth, ID band Patient awake    History of Anesthesia Complications (+) history of anesthetic complications (states was admitted to the hospital after prior RCR for "low respirations." Did not have a nerve block. )  Airway Mallampati: III  TM Distance: <3 FB   Mouth opening: Limited Mouth Opening  Dental no notable dental hx.    Pulmonary asthma , sleep apnea (doesn't wear CPAP) ,    Pulmonary exam normal        Cardiovascular Exercise Tolerance: Good hypertension, Normal cardiovascular exam     Neuro/Psych Anxiety  Neuromuscular disease negative psych ROS   GI/Hepatic Neg liver ROS, GERD  ,  Endo/Other  Obesity, BMI 32  Renal/GU negative Renal ROS     Musculoskeletal  (+) Arthritis , Osteoarthritis,    Abdominal   Peds negative pediatric ROS (+)  Hematology negative hematology ROS (+) anemia ,   Anesthesia Other Findings   Reproductive/Obstetrics                             Anesthesia Physical  Anesthesia Plan  ASA: III  Anesthesia Plan: Spinal   Post-op Pain Management:    Induction: Intravenous  PONV Risk Score and Plan: 3 and Propofol infusion  Airway Management Planned: Nasal Cannula  Additional Equipment:   Intra-op Plan:   Post-operative Plan:   Informed Consent: I have reviewed the patients History and Physical, chart, labs and discussed the procedure including the risks, benefits and alternatives for the proposed anesthesia with the patient or authorized representative who has indicated his/her understanding and acceptance.     Dental advisory given  Plan Discussed with: CRNA  Anesthesia Plan Comments:         Anesthesia Quick Evaluation

## 2020-01-21 ENCOUNTER — Encounter: Payer: Self-pay | Admitting: Orthopedic Surgery

## 2020-01-21 LAB — SURGICAL PATHOLOGY

## 2020-01-21 NOTE — Anesthesia Postprocedure Evaluation (Signed)
Anesthesia Post Note  Patient: Sheila Rivas  Procedure(s) Performed: TOTAL HIP ARTHROPLASTY (Left Hip)  Patient location during evaluation: PACU Anesthesia Type: Spinal Level of consciousness: awake and alert and oriented Pain management: pain level controlled Vital Signs Assessment: post-procedure vital signs reviewed and stable Respiratory status: spontaneous breathing Cardiovascular status: blood pressure returned to baseline Anesthetic complications: no   No complications documented.   Last Vitals:  Vitals:   01/20/20 1539 01/20/20 1647  BP: (!) 147/77 (!) 153/71  Pulse: 82 88  Resp: 16 16  Temp:  36.8 C  SpO2: 98% 97%    Last Pain:  Vitals:   01/20/20 1647  TempSrc: Temporal  PainSc: 3                  Aalyiah Camberos

## 2020-03-06 DIAGNOSIS — Z96642 Presence of left artificial hip joint: Secondary | ICD-10-CM | POA: Insufficient documentation

## 2020-07-17 NOTE — Discharge Instructions (Signed)
Instructions after Total Hip Replacement     Sheila Rivas P. Bracken Moffa, Jr., M.D.     Dept. of Orthopaedics & Sports Medicine  [facility]  [address]  [place], [place]  [address]  Phone: [phone]   Fax: [phone]    DIET: . Drink plenty of non-alcoholic fluids. . Resume your normal diet. Include foods high in fiber.  ACTIVITY:  . You may use crutches or a walker with weight-bearing as tolerated, unless instructed otherwise. . You may be weaned off of the walker or crutches by your Physical Therapist.  . Do NOT reach below the level of your knees or cross your legs until allowed.    . Continue doing gentle exercises. Exercising will reduce the pain and swelling, increase motion, and prevent muscle weakness.   . Please continue to use the TED compression stockings for 6 weeks. You may remove the stockings at night, but should reapply them in the morning. . Do not drive or operate any equipment until instructed.  WOUND CARE:  . Continue to use ice packs periodically to reduce pain and swelling. . Keep the incision clean and dry. . You may bathe or shower after the staples are removed at the first office visit following surgery.  MEDICATIONS: . You may resume your regular medications. . Please take the pain medication as prescribed on the medication. . Do not take pain medication on an empty stomach. . You have been given a prescription for a blood thinner to prevent blood clots. Please take the medication as instructed. (NOTE: After completing a 2 week course of Lovenox, take one Enteric-coated aspirin once a day.) . Pain medications and iron supplements can cause constipation. Use a stool softener (Senokot or Colace) on a daily basis and a laxative (dulcolax or miralax) as needed. . Do not drive or drink alcoholic beverages when taking pain medications.  CALL THE OFFICE FOR: . Temperature above 101 degrees . Excessive bleeding or drainage on the dressing. . Excessive  swelling, coldness, or paleness of the toes. . Persistent nausea and vomiting.  FOLLOW-UP:  . You should have an appointment to return to the office in 6 weeks after surgery. . Arrangements have been made for continuation of Physical Therapy (either home therapy or outpatient therapy).     [facility] Department Directory         [email or web address]       [email or web address]          Cardiology  Appointments: [place] - [phone] [place] - [phone]  Endocrinology  Appointments: [place] - [phone] [place] - [phone]  Gastroenterology  Appointments: [place] - [phone] [place] - [phone]        General Surgery   Appointments: [place] - [phone]  Internal Medicine/Family Medicine  Appointments: [place] - [phone] [place] - [phone] [place] - [phone]  Metabolic and Weigh Loss Surgery  Appointments: [place] - [phone]        Neurology  Appointments: [place] - [phone] [place] - [phone]  Neurosurgery  Appointments: [place] - [phone]  Obstetrics & Gynecology  Appointments: [place] - [phone] [place] - [phone]        Pediatrics  Appointments: [place] - [phone] [place] - [phone]  Physiatry  Appointments: [place] [phone]  Physical Therapy  Appointments: [place] - [phone] [place] - [phone]        Podiatry  Appointments: [place] - [phone] [place] - [phone]  Pulmonology  Appointments: [place] - [phone]  Rheumatology  Appointments: [place] - [phone]        [place] Location: [facility]  [address] [place], [place]  [address]  [place] Location: [facility] [address] [place], [place]  [address]  [place] Location: [facility] [address] [place], [place]  [address]    

## 2020-07-22 ENCOUNTER — Other Ambulatory Visit: Payer: Self-pay

## 2020-07-22 ENCOUNTER — Encounter
Admission: RE | Admit: 2020-07-22 | Discharge: 2020-07-22 | Disposition: A | Discharge status: Home / Self Care | Payer: Medicare HMO | Source: Ambulatory Visit | Attending: Orthopedic Surgery | Admitting: Orthopedic Surgery

## 2020-07-22 DIAGNOSIS — Z01818 Encounter for other preprocedural examination: Secondary | ICD-10-CM | POA: Diagnosis not present

## 2020-07-22 LAB — CBC
HCT: 39.5 % (ref 36.0–46.0)
Hemoglobin: 13.5 g/dL (ref 12.0–15.0)
MCH: 31.3 pg (ref 26.0–34.0)
MCHC: 34.2 g/dL (ref 30.0–36.0)
MCV: 91.6 fL (ref 80.0–100.0)
Platelets: 272 10*3/uL (ref 150–400)
RBC: 4.31 MIL/uL (ref 3.87–5.11)
RDW: 12.8 % (ref 11.5–15.5)
WBC: 9.3 10*3/uL (ref 4.0–10.5)
nRBC: 0 % (ref 0.0–0.2)

## 2020-07-22 LAB — APTT: aPTT: 27 seconds (ref 24–36)

## 2020-07-22 LAB — COMPREHENSIVE METABOLIC PANEL
ALT: 32 U/L (ref 0–44)
AST: 31 U/L (ref 15–41)
Albumin: 4.5 g/dL (ref 3.5–5.0)
Alkaline Phosphatase: 83 U/L (ref 38–126)
Anion gap: 12 (ref 5–15)
BUN: 26 mg/dL — ABNORMAL HIGH (ref 8–23)
CO2: 23 mmol/L (ref 22–32)
Calcium: 9.7 mg/dL (ref 8.9–10.3)
Chloride: 103 mmol/L (ref 98–111)
Creatinine, Ser: 0.67 mg/dL (ref 0.44–1.00)
GFR, Estimated: >60 mL/min (ref >60)
Glucose, Bld: 89 mg/dL (ref 70–99)
Potassium: 3.4 mmol/L — ABNORMAL LOW (ref 3.5–5.1)
Sodium: 138 mmol/L (ref 135–145)
Total Bilirubin: 0.8 mg/dL (ref 0.3–1.2)
Total Protein: 7.7 g/dL (ref 6.5–8.1)

## 2020-07-22 LAB — URINALYSIS, ROUTINE W REFLEX MICROSCOPIC
Bacteria, UA: NONE SEEN
Bilirubin Urine: NEGATIVE
Glucose, UA: NEGATIVE mg/dL
Hgb urine dipstick: NEGATIVE
Ketones, ur: NEGATIVE mg/dL
Nitrite: NEGATIVE
Protein, ur: NEGATIVE mg/dL
Specific Gravity, Urine: 1.012 (ref 1.005–1.030)
pH: 6 (ref 5.0–8.0)

## 2020-07-22 LAB — C-REACTIVE PROTEIN: CRP: 0.6 mg/dL (ref <1.0)

## 2020-07-22 LAB — TYPE AND SCREEN
ABO/RH(D): A POS
Antibody Screen: NEGATIVE

## 2020-07-22 LAB — PROTIME-INR
INR: 1 (ref 0.8–1.2)
Prothrombin Time: 12.9 seconds (ref 11.4–15.2)

## 2020-07-22 LAB — SURGICAL PCR SCREEN
MRSA, PCR: NEGATIVE
Staphylococcus aureus: POSITIVE — AB

## 2020-07-22 LAB — SEDIMENTATION RATE: Sed Rate: 27 mm/hr — ABNORMAL HIGH (ref 0–22)

## 2020-07-22 NOTE — Patient Instructions (Addendum)
Your procedure is scheduled on: Monday August 01, 2020. Report to Day Surgery inside [facility] 2nd floor (stop by admissions desk first before getting on elevator). To find out your arrival time please call [phone] between 1PM - 3PM on Friday July 29, 2020.  Remember: Instructions that are not followed completely may result in serious medical risk,  up to and including death, or upon the discretion of your surgeon and anesthesiologist your  surgery may need to be rescheduled.     _X__ 1. Do not eat food after midnight the night before your procedure.                 No chewing gum or hard candies. You may drink clear liquids up to 2 hours                 before you are scheduled to arrive for your surgery- DO not drink clear                 liquids within 2 hours of the start of your surgery.                 Clear Liquids include:  water, apple juice without pulp, clear Gatorade, G2 or                  Gatorade Zero (avoid Red/Purple/Blue), Black Coffee or Tea (Do not add                 anything to coffee or tea).  __X__2.   Complete the "Ensure Clear Pre-surgery Clear Carbohydrate Drink" provided to you, 2 hours before arrival. **If you are diabetic you will be provided with an alternative drink, Gatorade Zero or G2.  __X__3.  On the morning of surgery brush your teeth with toothpaste and water, you                may rinse your mouth with mouthwash if you wish.  Do not swallow any toothpaste of mouthwash.     _X__ 4.  No Alcohol for 24 hours before or after surgery.   _X__ 5.  Do Not Smoke or use e-cigarettes For 24 Hours Prior to Your Surgery.                 Do not use any chewable tobacco products for at least 6 hours prior to                 Surgery.  _X__  6.  Do not use any recreational drugs (marijuana, cocaine, heroin, ecstasy, MDMA or other)                For at least one week prior to your surgery.  Combination of these drugs with anesthesia                 May have life threatening results.  ____  7.  Bring all medications with you on the day of surgery if instructed.   __X__  8.  Notify your doctor if there is any change in your medical condition      (cold, fever, infections).     Do not wear jewelry, make-up, hairpins, clips or nail polish. Do not wear lotions, powders, or perfumes. You may wear deodorant. Do not shave 48 hours prior to surgery. Men may shave face and neck. Do not bring valuables to the hospital.    [facility] is not responsible for any belongings or valuables.  Contacts, dentures or bridgework may not be worn into surgery. Leave your suitcase in the car. After surgery it may be brought to your room. For patients admitted to the hospital, discharge time is determined by your treatment team.   Patients discharged the day of surgery will not be allowed to drive home.   Make arrangements for someone to be with you for the first 24 hours of your Same Day Discharge.    __X__ Take these medicines the morning of surgery with A SIP OF WATER:    1. None   2.   3.   4.  5.  6.  ____ Fleet Enema (as directed)   __X__ Use CHG Soap (or wipes) as directed  ____ Use Benzoyl Peroxide Gel as instructed  ____ Use inhalers on the day of surgery  ____ Stop metformin 2 days prior to surgery    ____ Take 1/2 of usual insulin dose the night before surgery. No insulin the morning          of surgery.   ____ Stop Coumadin/Plavix/aspirin   __X__ Stop Anti-inflammatories such as Ibuprofen, Aleve, Advil, motrin, naproxen, aspirin and or BC Powders.   __X__ Stop supplements until after surgery. zinc gluconate 50 MG, SUPER B COMPLEX/C,  Omega-3 Fatty Acids (FISH OIL) 1000 MGMelatonin 5 MG, Magnesium 250 MG, Ginkgo Biloba 500 MG,CANNABIDIOL PO and Calcium-Vitamin D-Vitamin K ([employer] CALCIUM SOFT CHEWS) 650-12.5-40 MG-MCG-MCG CHEW   __X__ Do not start any herbal supplements before your procedure.   If you have  any questions regarding your pre-procedure instructions,  Please call Pre-admit Testing at [phone].

## 2020-07-24 LAB — URINE CULTURE: Special Requests: NORMAL

## 2020-07-28 ENCOUNTER — Other Ambulatory Visit
Admission: RE | Admit: 2020-07-28 | Discharge: 2020-07-28 | Disposition: A | Discharge status: Home / Self Care | Payer: Medicare HMO | Source: Ambulatory Visit | Attending: Orthopedic Surgery | Admitting: Orthopedic Surgery

## 2020-07-28 ENCOUNTER — Other Ambulatory Visit: Payer: Self-pay

## 2020-07-28 DIAGNOSIS — Z20822 Contact with and (suspected) exposure to covid-19: Secondary | ICD-10-CM | POA: Insufficient documentation

## 2020-07-28 DIAGNOSIS — Z01812 Encounter for preprocedural laboratory examination: Secondary | ICD-10-CM | POA: Diagnosis present

## 2020-07-28 LAB — SARS CORONAVIRUS 2 (TAT 6-24 HRS): SARS Coronavirus 2: NEGATIVE

## 2020-07-31 ENCOUNTER — Encounter: Payer: Self-pay | Admitting: Orthopedic Surgery

## 2020-07-31 NOTE — H&P (Signed)
ORTHOPAEDIC HISTORY & PHYSICAL Sheila Rivas, [name] - 07/21/2020 2:45 PM EDT Formatting of this note is different from the original. [facility] Chief Complaint:   Chief Complaint  Patient presents with  . Hip Pain  H & P RIGHT HIP   History of Present Illness:   Sheila Rivas is a 70 y.o. female that presents to clinic today for her preoperative history and evaluation. Patient presents unaccompanied. The patient is scheduled to undergo a right total hip arthroplasty on 08/01/20 by Dr. Ernest Pine. Her pain began several years ago. The pain is located in the right hip and groin. She describes her pain as worse with weightbearing. She reports associated loss of range of motion of the hip. She denies associated numbness or tingling.   The patient's symptoms have progressed to the point that they decrease her quality of life. The patient has previously undergone conservative treatment including Tylenol, physical therapy, and activity modification without adequate control of her symptoms.  Patient denies history of blood clots, lower back surgery, or significant cardiac history.  Per chart review, patient lists allergy to latex.  Past Medical, Surgical, Family, Social History, Allergies, Medications:   Past Medical History:  Past Medical History:  Diagnosis Date  . Chronic low back pain without sciatica  . DDD (degenerative disc disease), lumbar 2019  Mild multilevel DDD, worse at L4-5  . GERD (gastroesophageal reflux disease)  Reasonably well controlled on probiotics only.  . Hypertension  . Mild intermittent asthma  . Obesity (BMI 30-39.9), unspecified  . 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  . Osteopenia 10/02/2019  FRAX: 0.5% 10 year risk of hip fracture, 7.6% 10 year risk of any major fracture.  . Sleep apnea  Not on CPAP as would remove unconsciously in sleep.  . Toe fracture,  right   Past Surgical History:  Past Surgical History:  Procedure Laterality Date  . ARTHROSCOPIC ROTATOR CUFF REPAIR Right 2013  . COLONOSCOPY 2016  2 polyps removed--repeat 5 yrs  . 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 R mini-open rotator cuff repair, arthroscopic extensive debridment of shoulder (glenohumeral and subacromial spaces) R arhtroscopic subacromial decompression Right 01/09/2019  Dr. Allena Katz   Current Medications:  Current Outpatient Medications  Medication Sig Dispense Refill  . acetaminophen (TYLENOL) 500 MG tablet Take 1,000 mg by mouth 3 (three) times daily  . ascorbic acid, vitamin C, (VITAMIN C) 500 MG tablet Take 500 mg by mouth 2 (two) times daily  . calcium carbonate 600 mg calcium (1,500 mg) Tab tablet Take 1,200 mg by mouth once daily  . celecoxib (CELEBREX) 200 MG capsule Take 1 capsule by mouth twice daily 60 capsule 0  . cholecalciferol (VITAMIN D3) 1,000 unit capsule Take 2,000 Units by mouth once daily  . docosahexanoic acid/epa (FISH OIL ORAL) Take 1,000 mg by mouth once daily  . gabapentin (NEURONTIN) 300 MG capsule Take 1 capsule (300 mg total) by mouth 3 (three) times daily May add additional 300 mg dose 1-2 times/day to her regularly scheduled dosing. 360 capsule 1  . ginkgo biloba 60 mg Cap Take 1 capsule by mouth once daily  . hydroCHLOROthiazide (HYDRODIURIL) 25 MG tablet Take 1 tablet by mouth once daily 90 tablet 1  . L. acidophilus/Bifid. animalis (DAILY PROBIOTIC ORAL) Take 1 capsule by mouth once daily  . lidocaine HCL 4 % Crea Apply 1 Application topically as directed  . losartan (COZAAR)  50 MG tablet Take 1 tablet by mouth once daily 90 tablet 1  . magnesium 250 mg Tab Take 250 mg by mouth once daily  . melatonin 10 mg Tab Take 10 mg by mouth nightly  . menthol (ASPERCREME HEAT) 10 % Gel Apply topically as needed  . nutritional supplement-fiber (QUINOA-KALE-HEMP) Liqd Take by mouth nightly as needed 1 DROPPER FULL  .  prednisoLONE acetate (PRED FORTE) 1 % ophthalmic suspension Place 1 drop into both eyes 3 (three) times daily  . RESTASIS 0.05 % ophthalmic emulsion Place 1 drop into both eyes 2 (two) times daily  . vitamin B complex (B COMPLEX ORAL) Take 2 tablets by mouth once daily  . zinc gluconate 50 mg tablet Take 100 mg by mouth once daily  . carboxymethylcellulose-glycerin (REFRESH RELIEVA) 0.5-0.9 % ophthalmic solution Place 1-2 drops into both eyes 2 (two) times daily   No current facility-administered medications for this visit.   Allergies:  Allergies  Allergen Reactions  . Dilaudid [Hydromorphone] Itching  . Gluten Abdominal Pain  Sharp pain and bloating  . Latex Rash  Redness where underwear elastic touches skin  . Tramadol Itching  Redness, red puffy eyes   Social History:  Social History   Socioeconomic History  . Marital status: Divorced  Spouse name: Not on file  . Number of children: 5  . Years of education: 51  . Highest education level: Bachelor's degree (e.g., BA, AB, BS)  Occupational History  . Occupation: Retired  Comment: Runner, broadcasting/film/video, Charity fundraiser, Designer, industrial/product  Tobacco Use  . Smoking status: Never Smoker  . Smokeless tobacco: Never Used  Vaping Use  . Vaping Use: Never used  Substance and Sexual Activity  . Alcohol use: Yes  Comment: sometimes socially  . Drug use: Never  . Sexual activity: Not Currently  Partners: Male  Birth control/protection: None  Other Topics Concern  . Not on file  Social History Narrative  Moved to the area from [place] in 2018 to be near 1 daughter & brought another daughter w/ her.   Social Determinants of Health   Financial Resource Strain: Not on file  Food Insecurity: Not on file  Transportation Needs: Not on file  Physical Activity: Not on file  Stress: Not on file  Social Connections: Not on file  Housing Stability: Not on file   Family History:  Family History  Problem Relation Age of Onset  . Myocardial Infarction (Heart attack)  Mother  . Thyroid disease Mother  . Diabetes type I Mother  . Alcohol abuse Father  . Myocardial Infarction (Heart attack) Father  . No Known Problems Daughter  . No Known Problems Son  . Breast cancer Paternal Grandmother  . No Known Problems Daughter  . No Known Problems Daughter  . No Known Problems Son  . No Known Problems Sister  . Other Maternal Grandmother  Some type of autoimmune arthritis  . No Known Problems Sister   Review of Systems:   A 10+ ROS was performed, reviewed, and the pertinent orthopaedic findings are documented in the HPI.   Physical Examination:   BP 130/80 (BP Location: Left upper arm, Patient Position: Sitting, BP Cuff Size: Large Adult)  Ht 156.2 cm (5' 1.5")  Wt 78.7 kg (173 lb 9.6 oz)  BMI 32.27 kg/m   Patient is a well-developed, well-nourished female in no acute distress. Patient has normal mood and affect. Patient is alert and oriented to person, place, and time.   HEENT: Atraumatic, normocephalic. Pupils equal and  reactive to light. Extraocular motion intact. Noninjected sclera.  Cardiovascular: Regular rate and rhythm, with no murmurs, rubs, or gallops. Distal pulses palpable. No bruits.  Respiratory: Lungs clear to auscultation bilaterally.   Right Hip: Soft tissue swelling: Negative Erythema: Negative Crepitance: Positive Tenderness: Greater trochanter is nontender to palpation. Moderate pain is elicited by axial compression or extremes of rotation. Atrophy: No atrophy. Fair to good hip flexor and abductor strength. Range of Motion: Right hip range of motion is difficult to assess secondary to guarding  Sensation is intact over the saphenous, lateral cutaneous, superficial fibular, and deep fibular nerve distributions.  Tests Performed/Reviewed:  X-rays  Anteroposterior view of the pelvis, anteroposterior and lateral views of the right hip were obtained. Images reveal severe loss of femoroacetabular joint space with osteophyte  formation and subchondral sclerosis noted. Left total hip arthroplasty visualized without signs of loosening, migration, or failure.  I personally ordered and interpreted today's xrays.  Impression:   ICD-10-CM  1. Primary osteoarthritis of right hip M16.11   Plan:   The patient has end-stage degenerative changes of the right hip. It was explained to the patient that the condition is progressive in nature. Having failed conservative treatment, the patient has elected to proceed with a total joint arthroplasty. The patient will undergo a total joint arthroplasty with Dr. Ernest Pine. The risks of surgery, including blood clot and infection, were discussed with the patient. Measures to reduce these risks, including the use of anticoagulation, perioperative antibiotics, and early ambulation were discussed. The importance of postoperative physical therapy was discussed with the patient. The patient elects to proceed with surgery. The patient is instructed to stop all blood thinners prior to surgery. The patient is instructed to call the hospital the day before surgery to learn of the proper arrival time.   Contact our office with any questions or concerns. Follow up as indicated, or sooner should any new problems arise, if conditions worsen, or if they are otherwise concerned.   Sheila Gardener, PA-C [facility] [address] [place], [place] [address] Phone: [phone]  This note was generated in part with voice recognition software and I apologize for any typographical errors that were not detected and corrected.  Electronically signed by Sheila Gardener, PA at 07/22/2020 10:33 PM EDT

## 2020-08-01 ENCOUNTER — Encounter
Admission: RE | Disposition: A | Discharge status: Home / Self Care | Payer: Self-pay | Source: Home / Self Care | Attending: Orthopedic Surgery

## 2020-08-01 ENCOUNTER — Encounter: Payer: Self-pay | Admitting: Orthopedic Surgery

## 2020-08-01 ENCOUNTER — Other Ambulatory Visit: Payer: Self-pay

## 2020-08-01 ENCOUNTER — Observation Stay
Admission: RE | Admit: 2020-08-01 | Discharge: 2020-08-02 | Disposition: A | Discharge status: Home / Self Care | Payer: Medicare HMO | Attending: Orthopedic Surgery | Admitting: Orthopedic Surgery

## 2020-08-01 ENCOUNTER — Ambulatory Visit: Payer: Medicare HMO | Admitting: Anesthesiology

## 2020-08-01 ENCOUNTER — Observation Stay: Payer: Medicare HMO

## 2020-08-01 DIAGNOSIS — I10 Essential (primary) hypertension: Secondary | ICD-10-CM | POA: Insufficient documentation

## 2020-08-01 DIAGNOSIS — Z96649 Presence of unspecified artificial hip joint: Secondary | ICD-10-CM

## 2020-08-01 DIAGNOSIS — M1611 Unilateral primary osteoarthritis, right hip: Principal | ICD-10-CM | POA: Insufficient documentation

## 2020-08-01 DIAGNOSIS — Z96642 Presence of left artificial hip joint: Secondary | ICD-10-CM | POA: Diagnosis not present

## 2020-08-01 DIAGNOSIS — Z91040 Latex allergy status: Secondary | ICD-10-CM | POA: Diagnosis not present

## 2020-08-01 DIAGNOSIS — Z79899 Other long term (current) drug therapy: Secondary | ICD-10-CM | POA: Diagnosis not present

## 2020-08-01 DIAGNOSIS — J45909 Unspecified asthma, uncomplicated: Secondary | ICD-10-CM | POA: Diagnosis not present

## 2020-08-01 HISTORY — PX: TOTAL HIP ARTHROPLASTY: SHX124

## 2020-08-01 IMAGING — DX DG HIP (WITH OR WITHOUT PELVIS) 1V PORT*R*
2 series · 2 of 2 positions shown · non-contrast
Comparison: Left hip series [DATE].

CLINICAL DATA: 69-year-old female status post right hip
replacement.

EXAM:
DG HIP (WITH OR WITHOUT PELVIS) 1V PORT RIGHT

[pelvis ap]
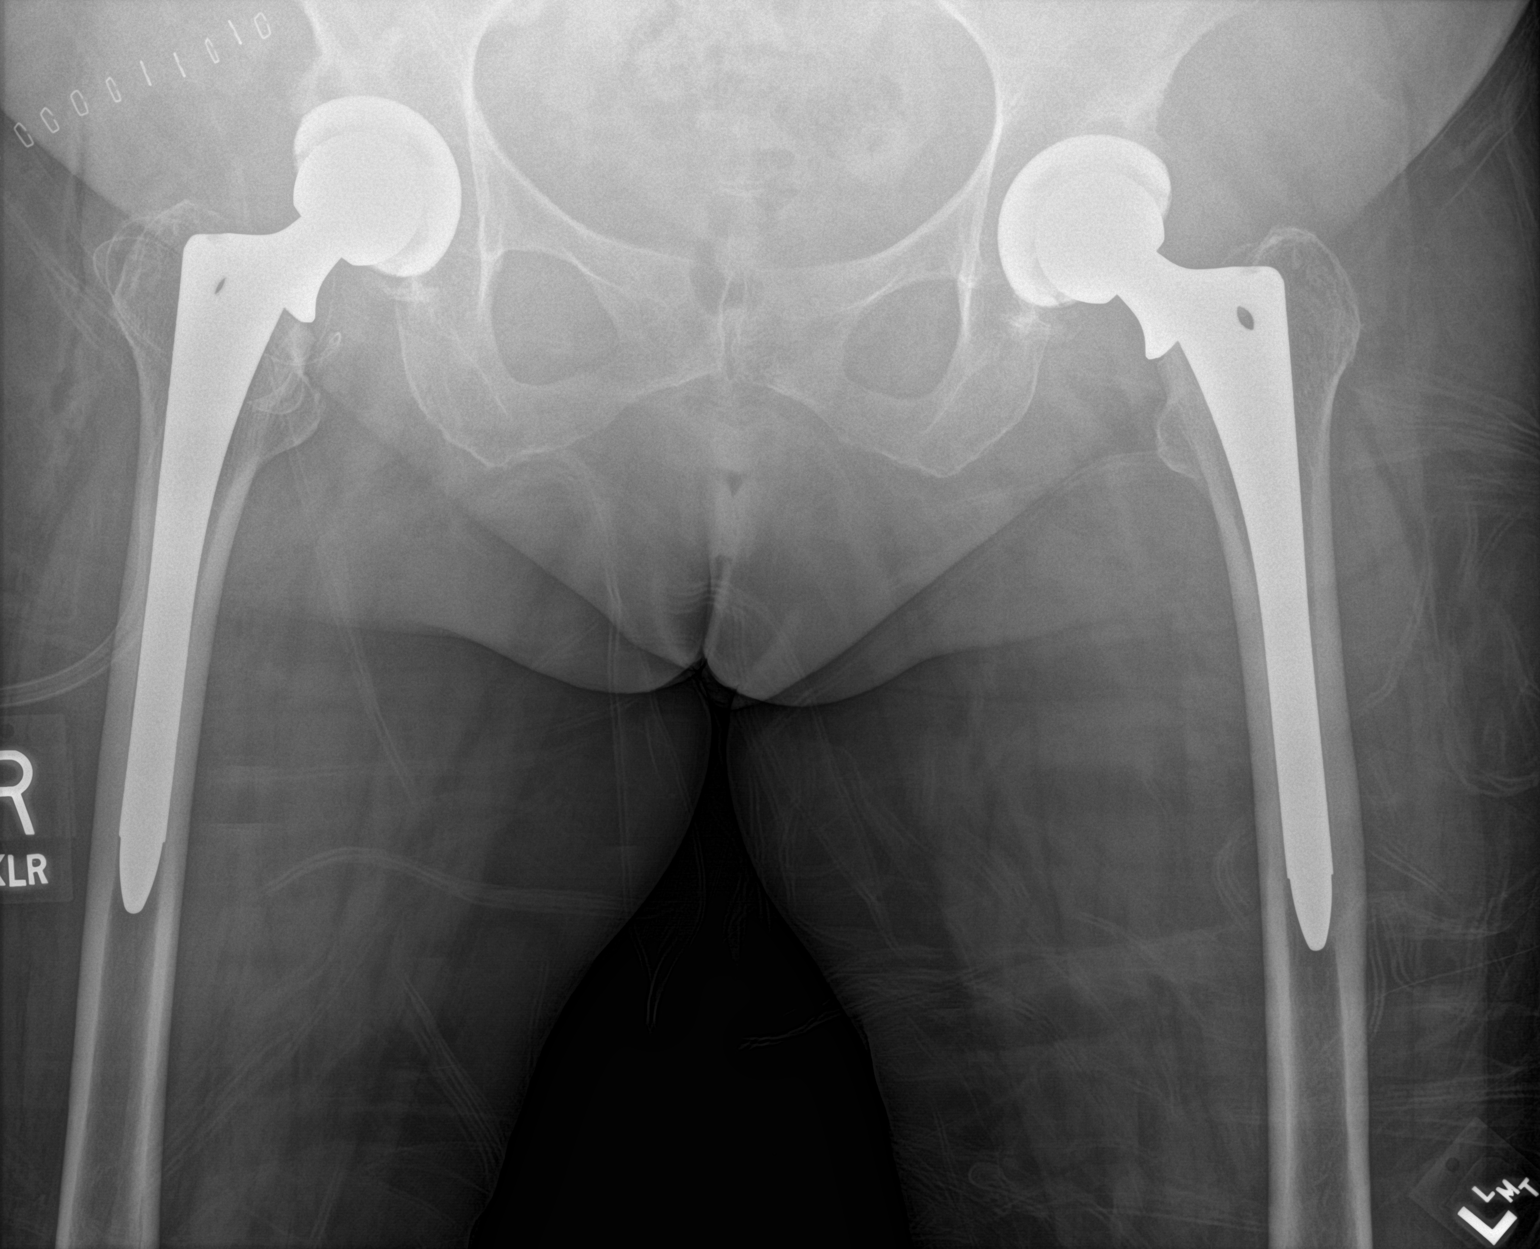

[hip lat]
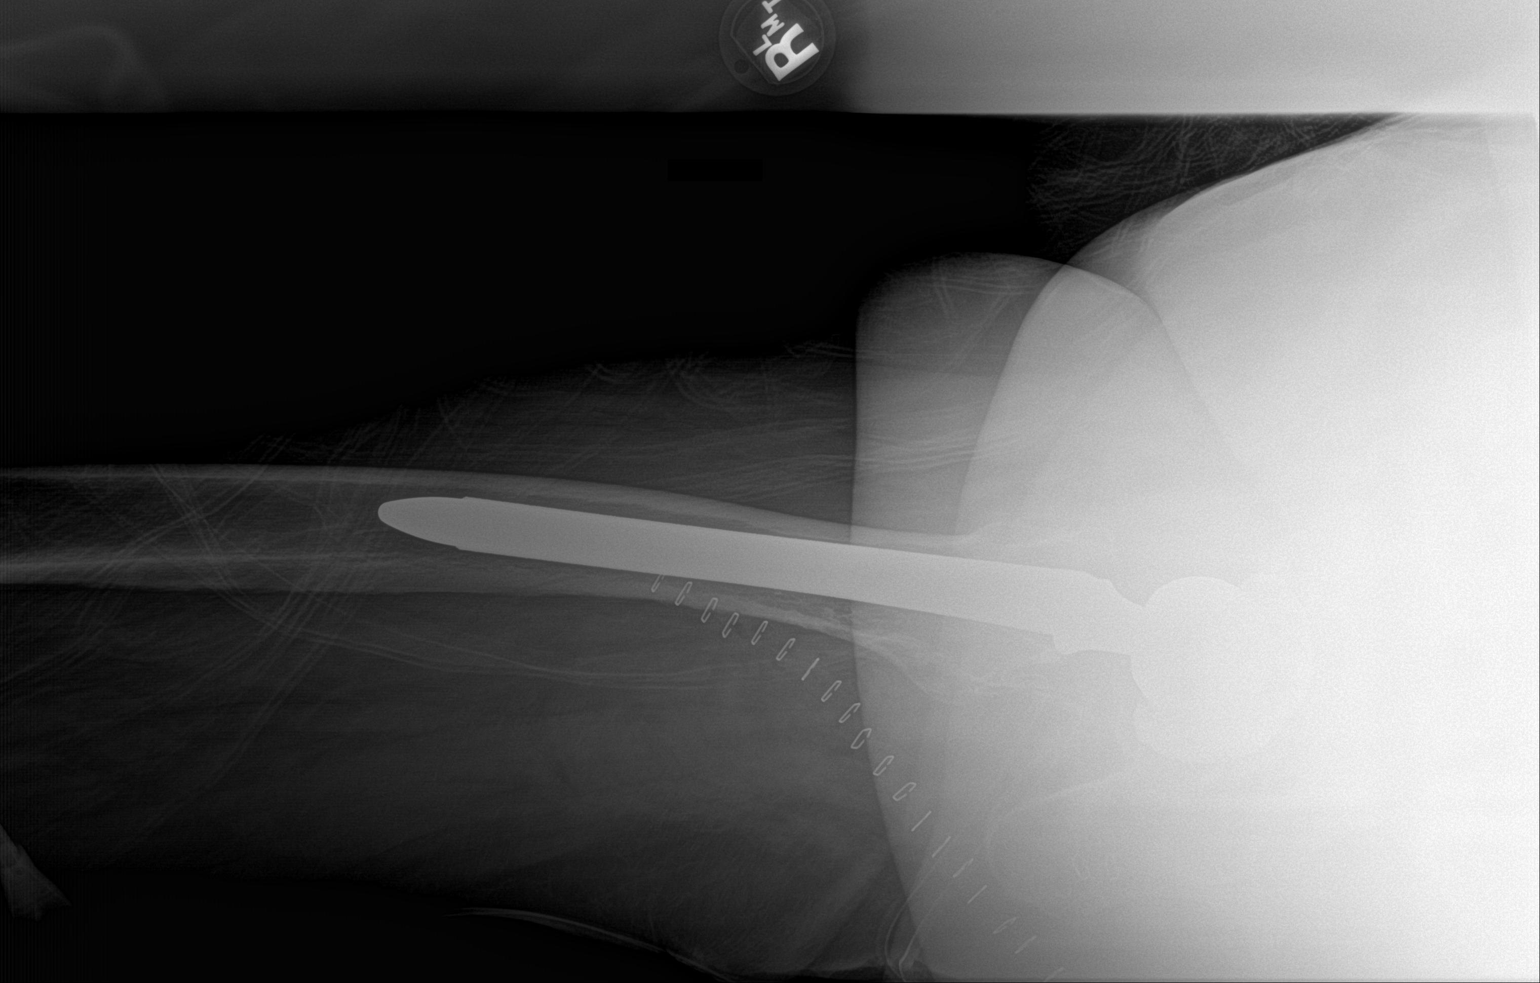

[2 of 2 positions shown; findings below may reference images not displayed]

FINDINGS: AP and cross-table lateral views of the lower pelvis and right hip.
Pre-existing left bipolar hip arthroplasty. New right bipolar hip
arthroplasty. Hardware appears intact and normally aligned.
Postoperative drain or wound VAC in place. Overlying skin staples.
No unexpected osseous changes.
IMPRESSION: New right bipolar hip arthroplasty with no adverse features.

## 2020-08-01 SURGERY — ARTHROPLASTY, HIP, TOTAL,POSTERIOR APPROACH
Anesthesia: Spinal | Site: Hip | Laterality: Right

## 2020-08-01 MED ORDER — SODIUM CHLORIDE 0.9 % IV SOLN
INTRAVENOUS | Status: DC | PRN
  Administered 2020-08-01: 25 ug/min via INTRAVENOUS

## 2020-08-01 MED ORDER — CALCIUM-VITAMIN D-VITAMIN K 650-12.5-40 MG-MCG-MCG PO CHEW: 2.0000 | CHEWABLE_TABLET | Freq: Every day | ORAL | Status: DC

## 2020-08-01 MED ORDER — TRANEXAMIC ACID-NACL 1000-0.7 MG/100ML-% IV SOLN
1000.0000 mg | INTRAVENOUS | Status: AC
  Administered 2020-08-01: 1000 mg via INTRAVENOUS

## 2020-08-01 MED ORDER — CYCLOSPORINE 0.05 % OP EMUL
1.0000 [drp] | Freq: Two times a day (BID) | OPHTHALMIC | Status: DC
  Administered 2020-08-01 – 2020-08-02 (×2): 1 [drp] via OPHTHALMIC
  Filled 2020-08-01 (×3): qty 1

## 2020-08-01 MED ORDER — FENTANYL CITRATE (PF) 100 MCG/2ML IJ SOLN
INTRAMUSCULAR | Status: AC
  Administered 2020-08-01: 25 ug via INTRAVENOUS
  Filled 2020-08-01: qty 2

## 2020-08-01 MED ORDER — OMEGA-3-ACID ETHYL ESTERS 1 G PO CAPS
1.0000 g | ORAL_CAPSULE | Freq: Every day | ORAL | Status: DC
  Administered 2020-08-01 – 2020-08-02 (×2): 1 g via ORAL
  Filled 2020-08-01 (×2): qty 1

## 2020-08-01 MED ORDER — PROPOFOL 1000 MG/100ML IV EMUL
INTRAVENOUS | Status: AC
  Filled 2020-08-01: qty 100

## 2020-08-01 MED ORDER — BUPIVACAINE HCL (PF) 0.5 % IJ SOLN
INTRAMUSCULAR | Status: DC | PRN
  Administered 2020-08-01: 3 mL via INTRATHECAL

## 2020-08-01 MED ORDER — NEOMYCIN-POLYMYXIN B GU 40-200000 IR SOLN
Status: AC
  Filled 2020-08-01: qty 20

## 2020-08-01 MED ORDER — ENOXAPARIN SODIUM 30 MG/0.3ML ~~LOC~~ SOLN
30.0000 mg | Freq: Two times a day (BID) | SUBCUTANEOUS | Status: DC
  Administered 2020-08-01 – 2020-08-02 (×2): 30 mg via SUBCUTANEOUS
  Filled 2020-08-01 (×2): qty 0.3

## 2020-08-01 MED ORDER — ONDANSETRON HCL 4 MG PO TABS: 4.0000 mg | ORAL_TABLET | Freq: Four times a day (QID) | ORAL | Status: DC | PRN

## 2020-08-01 MED ORDER — NEOMYCIN-POLYMYXIN B GU 40-200000 IR SOLN
Status: DC | PRN
  Administered 2020-08-01: 14 mL

## 2020-08-01 MED ORDER — ORAL CARE MOUTH RINSE: 15.0000 mL | Freq: Once | OROMUCOSAL | Status: AC

## 2020-08-01 MED ORDER — GABAPENTIN 300 MG PO CAPS: 300.0000 mg | ORAL_CAPSULE | ORAL | Status: DC

## 2020-08-01 MED ORDER — TRANEXAMIC ACID-NACL 1000-0.7 MG/100ML-% IV SOLN
INTRAVENOUS | Status: AC
  Filled 2020-08-01: qty 100

## 2020-08-01 MED ORDER — MIDAZOLAM HCL 5 MG/5ML IJ SOLN
INTRAMUSCULAR | Status: DC | PRN
  Administered 2020-08-01: 2 mg via INTRAVENOUS

## 2020-08-01 MED ORDER — ALUM & MAG HYDROXIDE-SIMETH 200-200-20 MG/5ML PO SUSP: 30.0000 mL | ORAL | Status: DC | PRN

## 2020-08-01 MED ORDER — HYDROMORPHONE HCL 1 MG/ML IJ SOLN: 0.5000 mg | INTRAMUSCULAR | Status: DC | PRN

## 2020-08-01 MED ORDER — SURGIRINSE WOUND IRRIGATION SYSTEM - OPTIME
TOPICAL | Status: DC | PRN
  Administered 2020-08-01: 800 mL via TOPICAL

## 2020-08-01 MED ORDER — GABAPENTIN 300 MG PO CAPS
ORAL_CAPSULE | ORAL | Status: AC
  Administered 2020-08-01: 300 mg via ORAL
  Filled 2020-08-01: qty 1

## 2020-08-01 MED ORDER — ONDANSETRON HCL 4 MG/2ML IJ SOLN: 4.0000 mg | Freq: Once | INTRAMUSCULAR | Status: DC | PRN

## 2020-08-01 MED ORDER — FENTANYL CITRATE (PF) 100 MCG/2ML IJ SOLN
INTRAMUSCULAR | Status: DC | PRN
  Administered 2020-08-01: 50 ug via INTRAVENOUS
  Administered 2020-08-01 (×2): 25 ug via INTRAVENOUS

## 2020-08-01 MED ORDER — ACETAMINOPHEN 10 MG/ML IV SOLN
1000.0000 mg | Freq: Four times a day (QID) | INTRAVENOUS | Status: AC
  Administered 2020-08-01 – 2020-08-02 (×4): 1000 mg via INTRAVENOUS
  Filled 2020-08-01 (×3): qty 100

## 2020-08-01 MED ORDER — CEFAZOLIN SODIUM-DEXTROSE 2-4 GM/100ML-% IV SOLN
2.0000 g | INTRAVENOUS | Status: AC
  Administered 2020-08-01: 2 g via INTRAVENOUS

## 2020-08-01 MED ORDER — MENTHOL 3 MG MT LOZG
1.0000 | LOZENGE | OROMUCOSAL | Status: DC | PRN
  Filled 2020-08-01: qty 9

## 2020-08-01 MED ORDER — BUPIVACAINE HCL (PF) 0.25 % IJ SOLN
INTRAMUSCULAR | Status: AC
  Filled 2020-08-01: qty 60

## 2020-08-01 MED ORDER — OXYCODONE HCL 5 MG PO TABS
10.0000 mg | ORAL_TABLET | ORAL | Status: DC | PRN
  Administered 2020-08-01 – 2020-08-02 (×4): 10 mg via ORAL
  Filled 2020-08-01 (×5): qty 2

## 2020-08-01 MED ORDER — CALCIUM CITRATE-VITAMIN D 500-500 MG-UNIT PO CHEW
2.0000 | CHEWABLE_TABLET | Freq: Every day | ORAL | Status: DC
  Administered 2020-08-02: 2 via ORAL
  Filled 2020-08-01: qty 2

## 2020-08-01 MED ORDER — GABAPENTIN 300 MG PO CAPS: 300.0000 mg | ORAL_CAPSULE | Freq: Every day | ORAL | Status: DC | PRN

## 2020-08-01 MED ORDER — BUPIVACAINE LIPOSOME 1.3 % IJ SUSP
INTRAMUSCULAR | Status: AC
  Filled 2020-08-01: qty 20

## 2020-08-01 MED ORDER — TRANEXAMIC ACID-NACL 1000-0.7 MG/100ML-% IV SOLN: 1000.0000 mg | Freq: Once | INTRAVENOUS | Status: AC

## 2020-08-01 MED ORDER — SODIUM CHLORIDE 0.9 % IV SOLN: INTRAVENOUS | Status: DC

## 2020-08-01 MED ORDER — SODIUM CHLORIDE FLUSH 0.9 % IV SOLN
INTRAVENOUS | Status: AC
  Filled 2020-08-01: qty 40

## 2020-08-01 MED ORDER — METOCLOPRAMIDE HCL 10 MG PO TABS
10.0000 mg | ORAL_TABLET | Freq: Three times a day (TID) | ORAL | Status: DC
  Administered 2020-08-01 – 2020-08-02 (×4): 10 mg via ORAL
  Filled 2020-08-01 (×5): qty 1

## 2020-08-01 MED ORDER — FERROUS SULFATE 325 (65 FE) MG PO TABS
325.0000 mg | ORAL_TABLET | Freq: Two times a day (BID) | ORAL | Status: DC
  Administered 2020-08-01 – 2020-08-02 (×2): 325 mg via ORAL
  Filled 2020-08-01 (×2): qty 1

## 2020-08-01 MED ORDER — CALCIUM CARBONATE ANTACID 420 MG PO CHEW
840.0000 mg | CHEWABLE_TABLET | Freq: Every day | ORAL | Status: DC
  Filled 2020-08-01: qty 2

## 2020-08-01 MED ORDER — ONDANSETRON HCL 4 MG/2ML IJ SOLN: 4.0000 mg | Freq: Four times a day (QID) | INTRAMUSCULAR | Status: DC | PRN

## 2020-08-01 MED ORDER — CEFAZOLIN SODIUM-DEXTROSE 2-4 GM/100ML-% IV SOLN
INTRAVENOUS | Status: AC
  Filled 2020-08-01: qty 100

## 2020-08-01 MED ORDER — FLEET ENEMA 7-19 GM/118ML RE ENEM: 1.0000 | ENEMA | Freq: Once | RECTAL | Status: DC | PRN

## 2020-08-01 MED ORDER — ONDANSETRON HCL 4 MG/2ML IJ SOLN
INTRAMUSCULAR | Status: DC | PRN
  Administered 2020-08-01: 4 mg via INTRAVENOUS

## 2020-08-01 MED ORDER — OXYCODONE HCL 5 MG PO TABS
5.0000 mg | ORAL_TABLET | ORAL | Status: DC | PRN
Start: 2020-08-01 — End: 2020-08-02
  Administered 2020-08-01: 5 mg via ORAL

## 2020-08-01 MED ORDER — FENTANYL CITRATE (PF) 100 MCG/2ML IJ SOLN
INTRAMUSCULAR | Status: AC
  Filled 2020-08-01: qty 2

## 2020-08-01 MED ORDER — MAGNESIUM HYDROXIDE 400 MG/5ML PO SUSP
30.0000 mL | Freq: Every day | ORAL | Status: DC
  Administered 2020-08-02: 30 mL via ORAL
  Filled 2020-08-01: qty 30

## 2020-08-01 MED ORDER — PROPOFOL 500 MG/50ML IV EMUL
INTRAVENOUS | Status: DC | PRN
  Administered 2020-08-01: 125 ug/kg/min via INTRAVENOUS

## 2020-08-01 MED ORDER — ENSURE PRE-SURGERY PO LIQD
296.0000 mL | Freq: Once | ORAL | Status: DC
  Filled 2020-08-01: qty 296

## 2020-08-01 MED ORDER — CHLORHEXIDINE GLUCONATE 0.12 % MT SOLN
OROMUCOSAL | Status: AC
  Administered 2020-08-01: 15 mL via OROMUCOSAL
  Filled 2020-08-01: qty 15

## 2020-08-01 MED ORDER — GABAPENTIN 300 MG PO CAPS
300.0000 mg | ORAL_CAPSULE | Freq: Three times a day (TID) | ORAL | Status: DC
  Administered 2020-08-01 – 2020-08-02 (×3): 300 mg via ORAL
  Filled 2020-08-01 (×3): qty 1

## 2020-08-01 MED ORDER — CELECOXIB 200 MG PO CAPS: 400.0000 mg | ORAL_CAPSULE | Freq: Once | ORAL | Status: AC

## 2020-08-01 MED ORDER — FAMOTIDINE 20 MG PO TABS: 20.0000 mg | ORAL_TABLET | Freq: Once | ORAL | Status: AC

## 2020-08-01 MED ORDER — MAGNESIUM OXIDE 400 (241.3 MG) MG PO TABS
200.0000 mg | ORAL_TABLET | Freq: Every day | ORAL | Status: DC
  Administered 2020-08-01: 200 mg via ORAL
  Filled 2020-08-01: qty 1

## 2020-08-01 MED ORDER — FENTANYL CITRATE (PF) 100 MCG/2ML IJ SOLN: 25.0000 ug | INTRAMUSCULAR | Status: DC | PRN

## 2020-08-01 MED ORDER — VITAMIN D 25 MCG (1000 UNIT) PO TABS
2000.0000 [IU] | ORAL_TABLET | Freq: Every day | ORAL | Status: DC
  Administered 2020-08-01 – 2020-08-02 (×2): 2000 [IU] via ORAL
  Filled 2020-08-01 (×2): qty 2

## 2020-08-01 MED ORDER — DEXAMETHASONE SODIUM PHOSPHATE 10 MG/ML IJ SOLN
INTRAMUSCULAR | Status: AC
  Administered 2020-08-01: 8 mg via INTRAVENOUS
  Filled 2020-08-01: qty 1

## 2020-08-01 MED ORDER — CELECOXIB 200 MG PO CAPS
200.0000 mg | ORAL_CAPSULE | Freq: Two times a day (BID) | ORAL | Status: DC
  Administered 2020-08-01 – 2020-08-02 (×2): 200 mg via ORAL
  Filled 2020-08-01 (×2): qty 1

## 2020-08-01 MED ORDER — SENNOSIDES-DOCUSATE SODIUM 8.6-50 MG PO TABS
1.0000 | ORAL_TABLET | Freq: Two times a day (BID) | ORAL | Status: DC
  Administered 2020-08-01 – 2020-08-02 (×2): 1 via ORAL
  Filled 2020-08-01 (×2): qty 1

## 2020-08-01 MED ORDER — GABAPENTIN 300 MG PO CAPS: 300.0000 mg | ORAL_CAPSULE | Freq: Once | ORAL | Status: AC

## 2020-08-01 MED ORDER — DIPHENHYDRAMINE HCL 12.5 MG/5ML PO ELIX: 12.5000 mg | ORAL_SOLUTION | ORAL | Status: DC | PRN

## 2020-08-01 MED ORDER — ASCORBIC ACID 500 MG PO TABS
500.0000 mg | ORAL_TABLET | Freq: Two times a day (BID) | ORAL | Status: DC
  Administered 2020-08-01 – 2020-08-02 (×3): 500 mg via ORAL
  Filled 2020-08-01 (×3): qty 1

## 2020-08-01 MED ORDER — BISACODYL 10 MG RE SUPP
10.0000 mg | Freq: Every day | RECTAL | Status: DC | PRN
  Administered 2020-08-02: 10 mg via RECTAL
  Filled 2020-08-01: qty 1

## 2020-08-01 MED ORDER — FAMOTIDINE 20 MG PO TABS
ORAL_TABLET | ORAL | Status: AC
  Administered 2020-08-01: 20 mg via ORAL
  Filled 2020-08-01: qty 1

## 2020-08-01 MED ORDER — PREDNISOLONE ACETATE 1 % OP SUSP
1.0000 [drp] | Freq: Three times a day (TID) | OPHTHALMIC | Status: DC
  Administered 2020-08-01 – 2020-08-02 (×2): 1 [drp] via OPHTHALMIC
  Filled 2020-08-01: qty 1

## 2020-08-01 MED ORDER — HYDROCHLOROTHIAZIDE 25 MG PO TABS
25.0000 mg | ORAL_TABLET | Freq: Every day | ORAL | Status: DC
  Administered 2020-08-02: 25 mg via ORAL
  Filled 2020-08-01: qty 1

## 2020-08-01 MED ORDER — MIDAZOLAM HCL 2 MG/2ML IJ SOLN
INTRAMUSCULAR | Status: AC
  Filled 2020-08-01: qty 2

## 2020-08-01 MED ORDER — PHENOL 1.4 % MT LIQD
1.0000 | OROMUCOSAL | Status: DC | PRN
  Filled 2020-08-01: qty 177

## 2020-08-01 MED ORDER — CEFAZOLIN SODIUM-DEXTROSE 2-4 GM/100ML-% IV SOLN
2.0000 g | Freq: Four times a day (QID) | INTRAVENOUS | Status: AC
  Administered 2020-08-01 (×2): 2 g via INTRAVENOUS
  Filled 2020-08-01 (×2): qty 100

## 2020-08-01 MED ORDER — DEXAMETHASONE SODIUM PHOSPHATE 10 MG/ML IJ SOLN: 8.0000 mg | Freq: Once | INTRAMUSCULAR | Status: AC

## 2020-08-01 MED ORDER — PANTOPRAZOLE SODIUM 40 MG PO TBEC
40.0000 mg | DELAYED_RELEASE_TABLET | Freq: Two times a day (BID) | ORAL | Status: DC
  Administered 2020-08-01 – 2020-08-02 (×3): 40 mg via ORAL
  Filled 2020-08-01 (×3): qty 1

## 2020-08-01 MED ORDER — PROPOFOL 10 MG/ML IV BOLUS
INTRAVENOUS | Status: DC | PRN
  Administered 2020-08-01: 50 mg via INTRAVENOUS
  Administered 2020-08-01: 30 mg via INTRAVENOUS

## 2020-08-01 MED ORDER — CHLORHEXIDINE GLUCONATE 0.12 % MT SOLN: 15.0000 mL | Freq: Once | OROMUCOSAL | Status: AC

## 2020-08-01 MED ORDER — LACTATED RINGERS IV SOLN: INTRAVENOUS | Status: DC

## 2020-08-01 MED ORDER — SACCHAROMYCES BOULARDII 250 MG PO CAPS
250.0000 mg | ORAL_CAPSULE | Freq: Every day | ORAL | Status: DC
  Administered 2020-08-02: 250 mg via ORAL
  Filled 2020-08-01 (×2): qty 1

## 2020-08-01 MED ORDER — ACETAMINOPHEN 325 MG PO TABS: 325.0000 mg | ORAL_TABLET | Freq: Four times a day (QID) | ORAL | Status: DC | PRN

## 2020-08-01 MED ORDER — LOSARTAN POTASSIUM 50 MG PO TABS
50.0000 mg | ORAL_TABLET | Freq: Every day | ORAL | Status: DC
  Administered 2020-08-02: 50 mg via ORAL
  Filled 2020-08-01: qty 1

## 2020-08-01 MED ORDER — TRANEXAMIC ACID-NACL 1000-0.7 MG/100ML-% IV SOLN
INTRAVENOUS | Status: AC
  Administered 2020-08-01: 1000 mg via INTRAVENOUS
  Filled 2020-08-01: qty 100

## 2020-08-01 MED ORDER — CHLORHEXIDINE GLUCONATE 4 % EX LIQD: 60.0000 mL | Freq: Once | CUTANEOUS | Status: DC

## 2020-08-01 MED ORDER — CELECOXIB 200 MG PO CAPS
ORAL_CAPSULE | ORAL | Status: AC
  Administered 2020-08-01: 400 mg via ORAL
  Filled 2020-08-01: qty 2

## 2020-08-01 SURGICAL SUPPLY — 61 items
BLADE DRUM FLTD (BLADE) ×2 IMPLANT
BLADE SAW 90X25X1.19 OSCILLAT (BLADE) ×2 IMPLANT
CANISTER SUCT 1200ML W/VALVE (MISCELLANEOUS) ×2 IMPLANT
CANISTER SUCT 3000ML PPV (MISCELLANEOUS) ×4 IMPLANT
CARTRIDGE OIL MAESTRO DRILL (MISCELLANEOUS) ×1 IMPLANT
COVER WAND RF STERILE (DRAPES) ×2 IMPLANT
CUP ACETBLR 48 OD 100 SERIES (Hips) ×1 IMPLANT
DIFFUSER DRILL AIR PNEUMATIC (MISCELLANEOUS) ×2 IMPLANT
DRAPE 3/4 80X56 (DRAPES) ×2 IMPLANT
DRAPE INCISE IOBAN 66X60 STRL (DRAPES) ×2 IMPLANT
DRSG DERMACEA 8X12 NADH (GAUZE/BANDAGES/DRESSINGS) ×2 IMPLANT
DRSG MEPILEX SACRM 8.7X9.8 (GAUZE/BANDAGES/DRESSINGS) ×2 IMPLANT
DRSG OPSITE POSTOP 4X12 (GAUZE/BANDAGES/DRESSINGS) ×2 IMPLANT
DRSG OPSITE POSTOP 4X14 (GAUZE/BANDAGES/DRESSINGS) IMPLANT
DRSG TEGADERM 4X4.75 (GAUZE/BANDAGES/DRESSINGS) ×2 IMPLANT
DURAPREP 26ML APPLICATOR (WOUND CARE) ×3 IMPLANT
ELECT CAUTERY BLADE 6.4 (BLADE) ×2 IMPLANT
ELECT REM PT RETURN 9FT ADLT (ELECTROSURGICAL) ×2
ELECTRODE REM PT RTRN 9FT ADLT (ELECTROSURGICAL) ×1 IMPLANT
GLOVE SURG ENC MOIS LTX SZ7.5 (GLOVE) ×4 IMPLANT
GLOVE SURG ENC TEXT LTX SZ7.5 (GLOVE) ×4 IMPLANT
GLOVE SURG UNDER LTX SZ8 (GLOVE) ×2 IMPLANT
GLOVE SURG UNDER POLY LF SZ7.5 (GLOVE) ×2 IMPLANT
GOWN STRL REUS W/ TWL LRG LVL3 (GOWN DISPOSABLE) ×2 IMPLANT
GOWN STRL REUS W/ TWL XL LVL3 (GOWN DISPOSABLE) ×1 IMPLANT
GOWN STRL REUS W/TWL LRG LVL3 (GOWN DISPOSABLE) ×2
GOWN STRL REUS W/TWL XL LVL3 (GOWN DISPOSABLE) ×1
HEAD FEM STD 32X+1 STRL (Hips) ×1 IMPLANT
HEMOVAC 400CC 10FR (MISCELLANEOUS) ×2 IMPLANT
HOLDER FOLEY CATH W/STRAP (MISCELLANEOUS) ×2 IMPLANT
HOOD PEEL AWAY FLYTE STAYCOOL (MISCELLANEOUS) ×4 IMPLANT
IRRIGATION SURGIPHOR STRL (IV SOLUTION) ×2 IMPLANT
IV NS IRRIG 3000ML ARTHROMATIC (IV SOLUTION) ×2 IMPLANT
KIT PEG BOARD PINK (KITS) ×2 IMPLANT
KIT TURNOVER KIT A (KITS) ×2 IMPLANT
LINER ACET 32X48 (Liner) ×1 IMPLANT
MANIFOLD NEPTUNE II (INSTRUMENTS) ×4 IMPLANT
NDL SAFETY ECLIPSE 18X1.5 (NEEDLE) ×1 IMPLANT
NEEDLE HYPO 18GX1.5 SHARP (NEEDLE) ×1
NS IRRIG 500ML POUR BTL (IV SOLUTION) ×2 IMPLANT
OIL CARTRIDGE MAESTRO DRILL (MISCELLANEOUS) ×2
PACK HIP PROSTHESIS (MISCELLANEOUS) ×2 IMPLANT
PENCIL SMOKE EVACUATOR COATED (MISCELLANEOUS) ×2 IMPLANT
PIN STEIN THRED 5/32 (Pin) ×2 IMPLANT
PULSAVAC PLUS IRRIG FAN TIP (DISPOSABLE) ×2
SOL PREP PVP 2OZ (MISCELLANEOUS) ×2
SOLUTION PREP PVP 2OZ (MISCELLANEOUS) ×1 IMPLANT
SPONGE DRAIN TRACH 4X4 STRL 2S (GAUZE/BANDAGES/DRESSINGS) ×2 IMPLANT
STAPLER SKIN PROX 35W (STAPLE) ×2 IMPLANT
STEM AML 12X155X30 STD SM 6IN (Joint) ×1 IMPLANT
SUT ETHIBOND #5 BRAIDED 30INL (SUTURE) ×2 IMPLANT
SUT VIC AB 0 CT1 36 (SUTURE) ×2 IMPLANT
SUT VIC AB 1 CT1 36 (SUTURE) ×4 IMPLANT
SUT VIC AB 2-0 CT1 27 (SUTURE) ×1
SUT VIC AB 2-0 CT1 TAPERPNT 27 (SUTURE) ×1 IMPLANT
SYR 20ML LL LF (SYRINGE) ×2 IMPLANT
TAPE CLOTH 3X10 WHT NS LF (GAUZE/BANDAGES/DRESSINGS) ×2 IMPLANT
TAPE TRANSPORE STRL 2 31045 (GAUZE/BANDAGES/DRESSINGS) ×2 IMPLANT
TIP FAN IRRIG PULSAVAC PLUS (DISPOSABLE) ×1 IMPLANT
TOWEL OR 17X26 4PK STRL BLUE (TOWEL DISPOSABLE) ×2 IMPLANT
TRAY FOLEY MTR SLVR 16FR STAT (SET/KITS/TRAYS/PACK) ×2 IMPLANT

## 2020-08-01 NOTE — H&P (Signed)
The patient has been re-examined, and the chart reviewed, and there have been no interval changes to the documented history and physical.    The risks, benefits, and alternatives have been discussed at length. The patient expressed understanding of the risks benefits and agreed with plans for surgical intervention.  Mathieu Schloemer P. Azael Ragain, Jr. M.D.    

## 2020-08-01 NOTE — Anesthesia Procedure Notes (Addendum)
Spinal  Patient location during procedure: OR Start time: 08/01/2020 7:15 AM End time: 08/01/2020 7:25 AM Reason for block: surgical anesthesia Staffing Performed: resident/CRNA  Anesthesiologist: Emmie Niemann, MD Resident/CRNA: Nelda Marseille, CRNA Preanesthetic Checklist Completed: patient identified, IV checked, site marked, risks and benefits discussed, surgical consent, monitors and equipment checked, pre-op evaluation and timeout performed Spinal Block Patient position: sitting Prep: ChloraPrep Patient monitoring: heart rate, continuous pulse ox and blood pressure Approach: midline Location: L4-5 Injection technique: single-shot Needle Needle type: Introducer and Whitacre  Needle gauge: 25 G Needle length: 9 cm Assessment Sensory level: T10 Events: CSF return Additional Notes Negative paresthesia. Negative blood return. Positive free-flowing CSF. Expiration date of kit checked and confirmed. Patient tolerated procedure well, without complications.

## 2020-08-01 NOTE — Evaluation (Signed)
Physical Therapy Evaluation Patient Details Name: Sheila Rivas MRN: [id number] DOB: [birth year] Today's Date: 08/01/2020   History of Present Illness  admitted for acute hospitalization s/p R THR, posterior approach, WBAT  Clinical Impression  Patient resting in bed upon arrival to room; alert and oriented, follows commands and agreeable to session.  Rates pain in R hip 4/10 at rest, 6/10 (per FACES scale) with movement. Meds requested and administered per RN during session.  Patient with full sensory return to R LE, and fair/good post-op strength (3-/5) and ROM (grossly 50% normal ROM), limited by pain.  Currently requiring min assist for bed mobility; min assist for sit/stand, basic transfers and gait (5' x3) with RW.  Demonstrates 3-point, step to gait pattern with limited stance time/loading R LE; limited R foot clearance, tends to slide across floor at times.  Additional distance limited by pain. Would benefit from skilled PT to address above deficits and promote optimal return to PLOF.; Recommend transition to HHPT upon discharge from acute hospitalization.     Follow Up Recommendations Home health PT    Equipment Recommendations       Recommendations for Other Services       Precautions / Restrictions Precautions Precautions: Fall;Posterior Hip Restrictions Weight Bearing Restrictions: Yes RLE Weight Bearing: Weight bearing as tolerated      Mobility  Bed Mobility Overal bed mobility: Needs Assistance Bed Mobility: Supine to Sit     Supine to sit: Min assist     General bed mobility comments: for R LE management    Transfers Overall transfer level: Needs assistance Equipment used: Rolling walker (2 wheeled) Transfers: Sit to/from Stand Sit to Stand: Min assist         General transfer comment: for hand/foot placement with movement transition  Ambulation/Gait Ambulation/Gait assistance: Min guard;Min assist Gait Distance (Feet):  (5' x3) Assistive  device: Rolling walker (2 wheeled)       General Gait Details: 3-point, step to gait pattern with limited stance time/loading R LE; limited R foot clearance, tends to slide across floor at times  Stairs            Wheelchair Mobility    Modified Rankin (Stroke Patients Only)       Balance Overall balance assessment: Needs assistance Sitting-balance support: No upper extremity supported;Feet supported Sitting balance-Leahy Scale: Good     Standing balance support: Bilateral upper extremity supported Standing balance-Leahy Scale: Fair                               Pertinent Vitals/Pain Pain Assessment: Faces Faces Pain Scale: Hurts even more Pain Location: R hip Pain Descriptors / Indicators: Guarding;Grimacing;Aching Pain Intervention(s): Limited activity within patient's tolerance;Monitored during session;Repositioned;Patient requesting pain meds-RN notified;RN gave pain meds during session    Home Living Family/patient expects to be discharged to:: Private residence Living Arrangements: Children Available Help at Discharge: Family;Available 24 hours/day Type of Home: House Home Access: Stairs to enter Entrance Stairs-Rails: None Entrance Stairs-Number of Steps: 3 Home Layout: One level Home Equipment: Bedside commode;Walker - 2 wheels      Prior Function Level of Independence: Independent         Comments: Pt reports she was generally independent, but limited 2/2 L hip pain. She assists with care of her 4 grandchildren, and is independent for BADL management. Intermittent use of SPC in recent weeks "when I needed it" (Determined by R hip pain)  Hand Dominance        Extremity/Trunk Assessment   Upper Extremity Assessment Upper Extremity Assessment: Overall WFL for tasks assessed    Lower Extremity Assessment Lower Extremity Assessment: Generalized weakness (R hip grossly 3-/5, limited by pain; otherwise, grossly WFL)        Communication   Communication: No difficulties  Cognition Arousal/Alertness: Awake/alert Behavior During Therapy: WFL for tasks assessed/performed Overall Cognitive Status: Within Functional Limits for tasks assessed                                        General Comments      Exercises Other Exercises Other Exercises: Supine R LE therex, 1x10, act ROM: ankle pumps, quad sets, SAQs, heel slides, hip abduct/adduct.  Good muscular strength and control Other Exercises: Verbally reviewed R hip THPs; patient voiced understanding of information. Other Exercises: Toilet transfer, SPT With RW, cga/min assist for safety   Assessment/Plan    PT Assessment Patient needs continued PT services  PT Problem List Decreased strength;Decreased range of motion;Decreased activity tolerance;Decreased balance;Decreased mobility;Decreased knowledge of use of DME;Decreased safety awareness;Decreased knowledge of precautions;Pain       PT Treatment Interventions DME instruction;Gait training;Stair training;Functional mobility training;Balance training;Therapeutic activities;Therapeutic exercise;Patient/family education    PT Goals (Current goals can be found in the Care Plan section)  Acute Rehab PT Goals Patient Stated Goal: to return home PT Goal Formulation: With patient Time For Goal Achievement: 08/15/20 Potential to Achieve Goals: Good    Frequency BID   Barriers to discharge        Co-evaluation               AM-PAC PT "6 Clicks" Mobility  Outcome Measure Help needed turning from your back to your side while in a flat bed without using bedrails?: A Little Help needed moving from lying on your back to sitting on the side of a flat bed without using bedrails?: A Little Help needed moving to and from a bed to a chair (including a wheelchair)?: A Little Help needed standing up from a chair using your arms (e.g., wheelchair or bedside chair)?: A Little Help needed to  walk in hospital room?: A Little Help needed climbing 3-5 steps with a railing? : A Little 6 Click Score: 18    End of Session Equipment Utilized During Treatment: Gait belt Activity Tolerance: Patient tolerated treatment well Patient left: in chair;with call bell/phone within reach;with chair alarm set Nurse Communication: Mobility status PT Visit Diagnosis: Muscle weakness (generalized) (M62.81);Difficulty in walking, not elsewhere classified (R26.2);Pain Pain - Right/Left: Right Pain - part of body: Hip    Time: 7510-2585 PT Time Calculation (min) (ACUTE ONLY): 34 min   Charges:   PT Evaluation $PT Eval Moderate Complexity: 1 Mod PT Treatments $Therapeutic Exercise: 8-22 mins $Therapeutic Activity: 8-22 mins       Brinda Focht H. Manson Passey, PT, DPT, NCS 08/01/20, 10:30 PM [phone]

## 2020-08-01 NOTE — Transfer of Care (Signed)
Immediate Anesthesia Transfer of Care Note  Patient: Sheila Rivas  Procedure(s) Performed: TOTAL HIP ARTHROPLASTY (Right Hip)  Patient Location: PACU  Anesthesia Type:Spinal  Level of Consciousness: awake, alert  and oriented  Airway & Oxygen Therapy: Patient Spontanous Breathing and Patient connected to face mask oxygen  Post-op Assessment: Report given to RN and Post -op Vital signs reviewed and stable  Post vital signs: Reviewed and stable  Last Vitals:  Vitals Value Taken Time  BP 135/72 08/01/20 1035  Temp    Pulse 58 08/01/20 1044  Resp 18 08/01/20 1044  SpO2 98 % 08/01/20 1044  Vitals shown include unvalidated device data.  Last Pain:  Vitals:   08/01/20 0631  TempSrc:   PainSc: 3          Complications: No complications documented.

## 2020-08-01 NOTE — Op Note (Signed)
OPERATIVE NOTE  DATE OF SURGERY:  08/01/2020  PATIENT NAME:  Sheila Rivas   DOB: May 29, 1950  MRN: [id number]  PRE-OPERATIVE DIAGNOSIS: Degenerative arthrosis of the right hip, primary  POST-OPERATIVE DIAGNOSIS:  Same  PROCEDURE:  Right total hip arthroplasty  SURGEON:  Jena Gauss. M.D.  ASSISTANT: Baldwin Jamaica, PA-C (present and scrubbed throughout the case, critical for assistance with exposure, retraction, instrumentation, and closure)  ANESTHESIA: spinal  ESTIMATED BLOOD LOSS: 100 mL  FLUIDS REPLACED: 1200 mL of crystalloid  DRAINS: 2 medium Hemovac  IMPLANTS UTILIZED: DePuy 12 mm small stature AML femoral stem, 48 mm OD Pinnacle 100 acetabular component, neutral Pinnacle Altrx polyethylene insert, and a 32 mm CoCr +1 mm hip ball  INDICATIONS FOR SURGERY: Sheila Rivas is a 70 y.o. year old female with a long history of progressive hip and groin  pain. X-rays demonstrated severe degenerative changes. The patient had not seen any significant improvement despite conservative nonsurgical intervention. After discussion of the risks and benefits of surgical intervention, the patient expressed understanding of the risks benefits and agree with plans for total hip arthroplasty.   The risks, benefits, and alternatives were discussed at length including but not limited to the risks of infection, bleeding, nerve injury, stiffness, blood clots, the need for revision surgery, limb length inequality, dislocation, cardiopulmonary complications, among others, and they were willing to proceed.  PROCEDURE IN DETAIL: The patient was brought into the operating room and, after adequate spinal anesthesia was achieved, the patient was placed in a left lateral decubitus position. Axillary roll was placed and all bony prominences were well-padded. The patient's right hip was cleaned and prepped with alcohol and DuraPrep and draped in the usual sterile fashion. A "timeout" was performed as per usual  protocol. A lateral curvilinear incision was made gently curving towards the posterior superior iliac spine. The IT band was incised in line with the skin incision and the fibers of the gluteus maximus were split in line. The piriformis tendon was identified, skeletonized, and incised at its insertion to the proximal femur and reflected posteriorly. A T type posterior capsulotomy was performed. Prior to dislocation of the femoral head, a threaded Steinmann pin was inserted through a separate stab incision into the pelvis superior to the acetabulum and bent in the form of a stylus so as to assess limb length and hip offset throughout the procedure. The femoral head was then dislocated posteriorly. Inspection of the femoral head demonstrated severe degenerative changes with full-thickness loss of articular cartilage. The femoral neck cut was performed using an oscillating saw. The anterior capsule was elevated off of the femoral neck using a periosteal elevator. Attention was then directed to the acetabulum. The remnant of the labrum was excised using electrocautery. Inspection of the acetabulum also demonstrated significant degenerative changes. The acetabulum was reamed in sequential fashion up to a 47 mm diameter. Good punctate bleeding bone was encountered. A 48 mm Pinnacle 100 acetabular component was positioned and impacted into place. Good scratch fit was appreciated. A neutral polyethylene trial was inserted.  Attention was then directed to the proximal femur. A hole for reaming of the proximal femoral canal was created using a high-speed burr. The femoral canal was reamed in sequential fashion up to a 11.5 mm diameter. This allowed for approximately 6.5 cm of scratch fit. Serial broaches were inserted up to a 12 mm small stature femoral broach. Calcar region was planed and a trial reduction was performed using a 32 mm hip ball with  a +1 mm neck length. Good equalization of limb lengths and hip offset was  appreciated and excellent stability was noted both anteriorly and posteriorly. Trial components were removed. The acetabular shell was irrigated with copious amounts of normal saline with antibiotic solution and suctioned dry. A neutral Pinnacle Altrx polyethylene insert was positioned and impacted into place. Next, a 12 mm small stature AML femoral stem was positioned and impacted into place. Excellent scratch fit was appreciated. A trial reduction was again performed with a 32 mm hip ball with a +1 mm neck length. Again, good equalization of limb lengths was appreciated and excellent stability appreciated both anteriorly and posteriorly. The hip was then dislocated and the trial hip ball was removed. The Morse taper was cleaned and dried. A 32 mm cobalt chromium hip ball with a +1 mm neck length was placed on the trunnion and impacted into place. The hip was then reduced and placed through range of motion. Excellent stability was appreciated both anteriorly and posteriorly.  The wound was irrigated with copious amounts of normal saline followed by 500 ml of Surgiphor and suctioned dry. Good hemostasis was appreciated. The posterior capsulotomy was repaired using #5 Ethibond. Piriformis tendon was reapproximated to the undersurface of the gluteus medius tendon using #5 Ethibond. The IT band was reapproximated using interrupted sutures of #1 Vicryl. Subcutaneous tissue was approximated using first #0 Vicryl followed by #2-0 Vicryl. The skin was closed with skin staples.  The patient tolerated the procedure well and was transported to the recovery room in stable condition.   Jena Gauss., M.D.

## 2020-08-01 NOTE — Anesthesia Preprocedure Evaluation (Signed)
Anesthesia Evaluation  Patient identified by MRN, date of birth, ID band Patient awake    Reviewed: Allergy & Precautions, NPO status , Patient's Chart, lab work & pertinent test results  History of Anesthesia Complications (+) history of anesthetic complications (pt reports "low respirations" after shoulder surgery)  Airway Mallampati: II  TM Distance: >3 FB Neck ROM: Full    Dental  (+) Poor Dentition   Pulmonary asthma , sleep apnea (does not have CPAP, did not tolerate) ,    breath sounds clear to auscultation- rhonchi (-) wheezing      Cardiovascular Exercise Tolerance: Good hypertension, Pt. on medications (-) CAD, (-) Past MI, (-) Cardiac Stents and (-) CABG  Rhythm:Regular Rate:Normal - Systolic murmurs and - Diastolic murmurs    Neuro/Psych neg Seizures PSYCHIATRIC DISORDERS Anxiety    GI/Hepatic Neg liver ROS, GERD  ,  Endo/Other  negative endocrine ROSneg diabetes  Renal/GU negative Renal ROS     Musculoskeletal  (+) Arthritis ,   Abdominal (+) + obese,   Peds  Hematology  (+) anemia ,   Anesthesia Other Findings Past Medical History: No date: Anemia     Comment:  with pregnancy No date: Anxiety No date: Arthritis     Comment:  osteo - hips, knees, lower back No date: Asthma     Comment:  well controlled No date: Complication of anesthesia     Comment:  Respirations dropped after shoulder surgery. spent the               night No date: DDD (degenerative disc disease), lumbar     Comment:  L4-L5 No date: GERD (gastroesophageal reflux disease) No date: Hypertension No date: Motion sickness     Comment:  [employer] theaters No date: Sleep apnea     Comment:  no CPAP/ not tolerated   Reproductive/Obstetrics                             Anesthesia Physical Anesthesia Plan  ASA: II  Anesthesia Plan: Spinal   Post-op Pain Management:    Induction:   PONV Risk Score and  Plan: 2 and Propofol infusion  Airway Management Planned: Natural Airway  Additional Equipment:   Intra-op Plan:   Post-operative Plan:   Informed Consent: I have reviewed the patients History and Physical, chart, labs and discussed the procedure including the risks, benefits and alternatives for the proposed anesthesia with the patient or authorized representative who has indicated his/her understanding and acceptance.     Dental advisory given  Plan Discussed with: CRNA and Anesthesiologist  Anesthesia Plan Comments:         Anesthesia Quick Evaluation

## 2020-08-02 ENCOUNTER — Encounter: Payer: Self-pay | Admitting: Orthopedic Surgery

## 2020-08-02 DIAGNOSIS — M1611 Unilateral primary osteoarthritis, right hip: Secondary | ICD-10-CM | POA: Diagnosis not present

## 2020-08-02 LAB — SURGICAL PATHOLOGY

## 2020-08-02 MED ORDER — ENOXAPARIN SODIUM 40 MG/0.4ML ~~LOC~~ SOLN: 40.0000 mg | SUBCUTANEOUS | 0 refills | Status: DC

## 2020-08-02 MED ORDER — OXYCODONE HCL 5 MG PO TABS: 5.0000 mg | ORAL_TABLET | ORAL | 0 refills | Status: DC | PRN

## 2020-08-02 NOTE — Progress Notes (Signed)
  Subjective: 1 Day Post-Op Procedure(s) (LRB): TOTAL HIP ARTHROPLASTY (Right) Patient reports pain as well-controlled.   Patient is well, and has had no acute complaints or problems Plan is to go Home after hospital stay. Negative for chest pain and shortness of breath Fever: no Gastrointestinal: negative for nausea and vomiting.   Patient has not had a bowel movement.  Objective: Vital signs in last 24 hours: Temp:  [97.2 F (36.2 C)-99 F (37.2 C)] 99 F (37.2 C) (04/12 0754) Pulse Rate:  [61-84] 65 (04/12 0754) Resp:  [12-18] 16 (04/12 0754) BP: (122-176)/(57-77) 149/73 (04/12 0754) SpO2:  [96 %-100 %] 99 % (04/12 0754)  Intake/Output from previous day:  Intake/Output Summary (Last 24 hours) at 08/02/2020 0805 Last data filed at 08/02/2020 0423 Gross per 24 hour  Intake 1540 ml  Output 395 ml  Net 1145 ml    Intake/Output this shift: No intake/output data recorded.  Labs: No results for input(s): HGB in the last 72 hours. No results for input(s): WBC, RBC, HCT, PLT in the last 72 hours. No results for input(s): NA, K, CL, CO2, BUN, CREATININE, GLUCOSE, CALCIUM in the last 72 hours. No results for input(s): LABPT, INR in the last 72 hours.   EXAM General - Patient is Alert, Appropriate and Oriented Extremity - Neurovascular intact Dorsiflexion/Plantar flexion intact Compartment soft Dressing/Incision -clean, dry, no drainage, Hemovac in place.  Motor Function - intact, moving foot and toes well on exam.  Cardiovascular- Regular rate and rhythm, no murmurs/rubs/gallops Respiratory- Lungs clear to auscultation bilaterally Gastrointestinal- soft, nontender and active bowel sounds   Assessment/Plan: 1 Day Post-Op Procedure(s) (LRB): TOTAL HIP ARTHROPLASTY (Right) Active Problems:   H/O total hip arthroplasty  Estimated body mass index is 31.84 kg/m as calculated from the following:   Height as of this encounter: 5' 1.5" (1.562 m).   Weight as of this  encounter: 77.7 kg. Advance diet Up with therapy  Discharge later today pending completion of PT goals.   DVT Prophylaxis - Lovenox, Ted hose and foot pumps Weight-Bearing as tolerated to right leg  Baldwin Jamaica, PA-C [facility] 08/02/2020, 8:05 AM

## 2020-08-02 NOTE — Progress Notes (Signed)
Physical Therapy Treatment Patient Details Name: Sheila Rivas MRN: [id number] DOB: 02/06/1951 Today's Date: 08/02/2020    History of Present Illness admitted for acute hospitalization s/p R THR, posterior approach, WBAT    PT Comments    Progressive increase in gait distance and overall activity tolerance this date, completing >200' gait and negotiating 3 steps (backwards with RW), cga/close sup.  Gradual improvement in gait mechanics, gait speed and overall loading/WBing R LE with cuing throughout session. Verbally reviewed car transfer technique, mobility (and overall therapy progression) post-acute; issued handout with written/pictorial descriptions of HEP and hip precautions. Patient voiced understanding of all information. Patient remains very eager, motivated to participate and progress with all therapy activities.  Clear for discharge home this date from therapy standpoint; MD/PA informed and aware.     Follow Up Recommendations  Home health PT     Equipment Recommendations       Recommendations for Other Services       Precautions / Restrictions Precautions Precautions: Fall;Posterior Hip Restrictions Weight Bearing Restrictions: Yes RLE Weight Bearing: Weight bearing as tolerated    Mobility  Bed Mobility Overal bed mobility: Modified Independent             General bed mobility comments: good awareness of R LE THPs    Transfers Overall transfer level: Needs assistance Equipment used: Rolling walker (2 wheeled)   Sit to Stand: Supervision         General transfer comment: good LE strength, control; intermittent cuing for hand placement to prevent pulling on RW  Ambulation/Gait Ambulation/Gait assistance: Supervision Gait Distance (Feet):  (>200) Assistive device: Rolling walker (2 wheeled)       General Gait Details: mixed step to and step through gait pattern; decreased stance time/WBing R LE, improved with cuing for softer L foot contact;  slow and deliberate, but gradually improving cadence/gait speed throughout distance   Stairs Stairs: Yes Stairs assistance: Min guard Stair Management: Backwards;With walker Number of Stairs: 3 General stair comments: step to gait pattern, ascending backwards, descending fowards; good sequencing and control; voices comfort with performance   Wheelchair Mobility    Modified Rankin (Stroke Patients Only)       Balance Overall balance assessment: Needs assistance Sitting-balance support: No upper extremity supported;Feet supported Sitting balance-Leahy Scale: Good     Standing balance support: Bilateral upper extremity supported Standing balance-Leahy Scale: Good                              Cognition Arousal/Alertness: Awake/alert Behavior During Therapy: WFL for tasks assessed/performed Overall Cognitive Status: Within Functional Limits for tasks assessed                                        Exercises Other Exercises Other Exercises: Verbally reviewed car transfer technique, mobility (and overall therapy progression) post-acute; issued handout with written/pictorial descriptions of HEP and hip precautions. Patient voiced understanding of all information.    General Comments        Pertinent Vitals/Pain Pain Assessment: 0-10 Pain Score: 3  Pain Location: R hip Pain Descriptors / Indicators: Guarding;Grimacing;Aching Pain Intervention(s): Limited activity within patient's tolerance;Monitored during session;Repositioned;Premedicated before session    Home Living                      Prior Function  PT Goals (current goals can now be found in the care plan section) Acute Rehab PT Goals Patient Stated Goal: to return home PT Goal Formulation: With patient Time For Goal Achievement: 08/15/20 Potential to Achieve Goals: Good Progress towards PT goals: Progressing toward goals    Frequency    BID      PT  Plan Current plan remains appropriate    Co-evaluation              AM-PAC PT "6 Clicks" Mobility   Outcome Measure  Help needed turning from your back to your side while in a flat bed without using bedrails?: None Help needed moving from lying on your back to sitting on the side of a flat bed without using bedrails?: None Help needed moving to and from a bed to a chair (including a wheelchair)?: None Help needed standing up from a chair using your arms (e.g., wheelchair or bedside chair)?: None Help needed to walk in hospital room?: None Help needed climbing 3-5 steps with a railing? : A Little 6 Click Score: 23    End of Session Equipment Utilized During Treatment: Gait belt Activity Tolerance: Patient tolerated treatment well Patient left: in chair;with call bell/phone within reach;with chair alarm set Nurse Communication: Mobility status PT Visit Diagnosis: Muscle weakness (generalized) (M62.81);Difficulty in walking, not elsewhere classified (R26.2);Pain Pain - Right/Left: Right Pain - part of body: Hip     Time: 1007-1219 PT Time Calculation (min) (ACUTE ONLY): 29 min  Charges:  $Gait Training: 8-22 mins $Therapeutic Activity: 8-22 mins                     Azhar Knope H. Manson Passey, PT, DPT, NCS 08/02/20, 10:07 AM [phone]

## 2020-08-02 NOTE — Discharge Summary (Signed)
Physician Discharge Summary  Patient ID: Sheila Rivas MRN: [id number] DOB/AGE: [birth year] 70 y.o.  Admit date: 08/01/2020 Discharge date: 08/02/2020  Admission Diagnoses:  H/O total hip arthroplasty [Z96.649]  Surgeries:Procedure(s):  Right total hip arthroplasty  SURGEON:  Jena Gauss. M.D.  ASSISTANT: Baldwin Jamaica, PA-C (present and scrubbed throughout the case, critical for assistance with exposure, retraction, instrumentation, and closure)  ANESTHESIA: spinal  ESTIMATED BLOOD LOSS: 100 mL  FLUIDS REPLACED: 1200 mL of crystalloid  DRAINS: 2 medium Hemovac  IMPLANTS UTILIZED: DePuy 12 mm small stature AML femoral stem, 48 mm OD Pinnacle 100 acetabular component, neutral Pinnacle Altrx polyethylene insert, and a 32 mm CoCr +1 mm hip ball  Discharge Diagnoses: Patient Active Problem List   Diagnosis Date Noted  . H/O total hip arthroplasty 08/01/2020  . 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  . Hypertension 01/19/2020  . Mild intermittent asthma 01/19/2020  . Obesity (BMI 30-39.9) 01/19/2020  . Chronic bilateral low back pain with left-sided sciatica 07/27/2019  . Foraminal stenosis of lumbar region 07/27/2019  . Osteoarthritis of hips, bilateral 10/21/2017  . DDD (degenerative disc disease), lumbar 04/23/2017    Past Medical History:  Diagnosis Date  . Anemia    with pregnancy  . Anxiety   . Arthritis    osteo - hips, knees, lower back  . Asthma    well controlled  . Complication of anesthesia    Respirations dropped after shoulder surgery. spent the night  . DDD (degenerative disc disease), lumbar    L4-L5  . GERD (gastroesophageal reflux disease)   . Hypertension   . Motion sickness    [employer] theaters  . Sleep apnea    no CPAP/ not tolerated     Transfusion:    Consultants (if any):   Discharged Condition: Improved  Hospital Course: [name] is an 70 y.o. female who was admitted 08/01/2020 with a diagnosis of right  hip osteoarthritis and went to the operating room on 08/01/2020 and underwent right total hip arthroplasty through posterior approach. The patient received perioperative antibiotics for prophylaxis (see below). The patient tolerated the procedure well and was transported to PACU in stable condition. After meeting PACU criteria, the patient was subsequently transferred to the Orthopaedics/Rehabilitation unit.   The patient received DVT prophylaxis in the form of early mobilization, Lovenox, Foot Pumps and TED hose. A sacral pad had been placed and heels were elevated off of the bed with rolled towels in order to protect skin integrity. Foley catheter was discontinued on postoperative day #0. Wound drains were discontinued on postoperative day #1. The surgical incision was healing well without signs of infection.  Physical therapy was initiated postoperatively for transfers, gait training, and strengthening. Occupational therapy was initiated for activities of daily living and evaluation for assisted devices. Rehabilitation goals were reviewed in detail with the patient. The patient made steady progress with physical therapy and physical therapy recommended discharge to Home.   The patient achieved the preliminary goals of this hospitalization and was felt to be medically and orthopaedically appropriate for discharge.  She was given perioperative antibiotics:  Anti-infectives (From admission, onward)   Start     Dose/Rate Route Frequency Ordered Stop   08/01/20 1400  ceFAZolin (ANCEF) IVPB 2g/100 mL premix        2 g 200 mL/hr over 30 Minutes Intravenous Every 6 hours 08/01/20 1204 08/01/20 2040   08/01/20 0622  ceFAZolin (ANCEF) 2-4 GM/100ML-% IVPB       Note  to Pharmacy: Carma Lair   : cabinet override      08/01/20 0622 08/01/20 0744   08/01/20 0600  ceFAZolin (ANCEF) IVPB 2g/100 mL premix        2 g 200 mL/hr over 30 Minutes Intravenous On call to O.R. 08/01/20 0215 08/01/20 0755     .  Recent vital signs:  Vitals:   08/02/20 0407 08/02/20 0754  BP: (!) 122/57 (!) 149/73  Pulse: 74 65  Resp: 16 16  Temp: 98 F (36.7 C) 99 F (37.2 C)  SpO2: 97% 99%    Recent laboratory studies:  No results for input(s): WBC, HGB, HCT, PLT, K, CL, CO2, BUN, CREATININE, GLUCOSE, CALCIUM, LABPT, INR in the last 72 hours.  Diagnostic Studies: DG Hip Port Unilat With Pelvis 1V Right  Result Date: 08/01/2020 CLINICAL DATA:  70 year old female status post right hip replacement. EXAM: DG HIP (WITH OR WITHOUT PELVIS) 1V PORT RIGHT COMPARISON:  Left hip series 01/20/2020. FINDINGS: AP and cross-table lateral views of the lower pelvis and right hip. Pre-existing left bipolar hip arthroplasty. New right bipolar hip arthroplasty. Hardware appears intact and normally aligned. Postoperative drain or wound VAC in place. Overlying skin staples. No unexpected osseous changes. IMPRESSION: New right bipolar hip arthroplasty with no adverse features. Electronically Signed   By: Odessa Fleming M.D.   On: 08/01/2020 11:13    Discharge Medications:   Allergies as of 08/02/2020      Reactions   Gluten Meal Other (See Comments)   Bloating, also sharp pain through out whole body with certain movements   Hydromorphone Itching   Tramadol Itching   Redness, red puffy eyes      Medication List    TAKE these medications   acetaminophen 500 MG tablet Commonly known as: TYLENOL Take 1,000 mg by mouth 3 (three) times daily.   ASPERCREME LIDOCAINE EX Apply 1 application topically daily as needed (pain).   CANNABIDIOL PO Take 1 Dose by mouth at bedtime as needed (sleep).   celecoxib 200 MG capsule Commonly known as: CeleBREX Take 1 capsule (200 mg total) by mouth 2 (two) times daily. What changed:   when to take this  reasons to take this   [facility] Calcium Soft Chews 650-12.5-40 MG-MCG-MCG Chew Generic drug: Calcium-Vitamin D-Vitamin K Chew 2 tablets by mouth daily.   cycloSPORINE 0.05 % ophthalmic  emulsion Commonly known as: RESTASIS Place 1 drop into both eyes 2 (two) times daily.   enoxaparin 40 MG/0.4ML injection Commonly known as: LOVENOX Inject 0.4 mLs (40 mg total) into the skin daily for 14 days.   Fish Oil 1000 MG Caps Take 1,000 mg by mouth daily.   gabapentin 300 MG capsule Commonly known as: NEURONTIN Take 300 mg by mouth See admin instructions. Take 300 mg 3 times daily, may take a fourth 300 mg dose as needed for pain   Ginkgo Biloba 500 MG Caps Take 500 mg by mouth daily.   hydrochlorothiazide 25 MG tablet Commonly known as: HYDRODIURIL Take 25 mg by mouth daily.   losartan 50 MG tablet Commonly known as: COZAAR Take 50 mg by mouth daily.   Magnesium 250 MG Tabs Take 250 mg by mouth at bedtime.   Melatonin 5 MG Caps Take 10 mg by mouth at bedtime.   oxyCODONE 5 MG immediate release tablet Commonly known as: Oxy IR/ROXICODONE Take 1 tablet (5 mg total) by mouth every 4 (four) hours as needed for moderate pain (pain score 4-6).   prednisoLONE acetate  1 % ophthalmic suspension Commonly known as: PRED FORTE Apply 1 drop to eye 3 (three) times daily.   PROBIOTIC PO Take 1 capsule by mouth daily.   SUPER B COMPLEX/C PO Take 1 tablet by mouth daily.   vitamin C 500 MG tablet Commonly known as: ASCORBIC ACID Take 500 mg by mouth 2 (two) times daily.   Vitamin D3 Gummies 25 MCG (1000 UT) Chew Generic drug: Cholecalciferol Chew 2,000 Units by mouth daily.   zinc gluconate 50 MG tablet Take 50 mg by mouth daily.            Durable Medical Equipment  (From admission, onward)         Start     Ordered   08/01/20 1205  DME Walker rolling  Once       Question:  Patient needs a walker to treat with the following condition  Answer:  S/P total hip arthroplasty   08/01/20 1204   08/01/20 1205  DME Bedside commode  Once       Question:  Patient needs a bedside commode to treat with the following condition  Answer:  S/P total hip arthroplasty    08/01/20 1204          Disposition: Home with home health PT     Follow-up Information    Donato Heinz, MD On 09/13/2020.   Specialty: Orthopedic Surgery Why: at 2:00pm Contact information: [address] [facility] [place] [address] [phone]                Lasandra Beech, PA-C 08/02/2020, 1:02 PM

## 2020-08-02 NOTE — Progress Notes (Signed)
Met with the patient in the room to discuss DC plan and needs She lives with her daughter and family, She has a RW and 3 in 1 at home, no other DME needed, She is set up with [facility], She can afford her meds She has transportation to the Doctor

## 2020-08-02 NOTE — Evaluation (Signed)
Occupational Therapy Evaluation Patient Details Name: Sheila Rivas MRN: [id number] DOB: Jul 17, 1950 Today's Date: 08/02/2020    History of Present Illness admitted for acute hospitalization s/p R THR, posterior approach, WBAT   Clinical Impression   Pt seen for OT evaluation this date, POD#1 from above surgery. Pt was independent in all ADLs prior to surgery, however occasionally limited due to R hip pain. Pt is eager to return to PLOF with less pain and improved safety and independence. Pt able to recall 2/3 posterior total hip precautions at start of session. Pt instructed in posterior total hip precautions and how to implement, self care skills, falls prevention strategies, home/routines modifications, DME/AE for LB bathing and dressing tasks, and compression stocking mgt strategies; handout provided. Pt currently requires SUPERVISION for functional mobility of short household distances with RW, toilet transfers, and standing grooming tasks due to pain and limited AROM of R hip. During session, pt able to adhere to 3/3 posterior total hip precautions during functional mobility and ADLs. Pt would benefit from additional instruction in self care skills and techniques to help maintain precautions, with or without assistive devices, to support recall and carryover prior to discharge. Recommend home health OT and supervision/assistance from family PRN upon discharge.      Follow Up Recommendations  Home health OT;Supervision - Intermittent    Equipment Recommendations  None recommended by OT       Precautions / Restrictions Precautions Precautions: Fall;Posterior Hip Restrictions Weight Bearing Restrictions: Yes RLE Weight Bearing: Weight bearing as tolerated      Mobility Bed Mobility Overal bed mobility: Modified Independent Bed Mobility: Sit to Supine       Sit to supine: Modified independent (Device/Increase time)   General bed mobility comments: good awareness of R LE THPs     Transfers Overall transfer level: Needs assistance Equipment used: Rolling walker (2 wheeled) Transfers: Sit to/from Stand Sit to Stand: Supervision         General transfer comment: safe hand placement during transfer to RW    Balance Overall balance assessment: Needs assistance Sitting-balance support: No upper extremity supported;Feet supported Sitting balance-Leahy Scale: Good     Standing balance support: No upper extremity supported;During functional activity Standing balance-Leahy Scale: Good Standing balance comment: Good standing balance during standing grooming tasks                           ADL either performed or assessed with clinical judgement   ADL Overall ADL's : Needs assistance/impaired     Grooming: Wash/dry hands;Oral care;Set up;Supervision/safety;Standing                   Toilet Transfer: Supervision/safety;Ambulation;BSC;RW           Functional mobility during ADLs: Supervision/safety;Rolling walker       Vision Baseline Vision/History: Wears glasses Wears Glasses: At all times              Pertinent Vitals/Pain Pain Assessment: 0-10 Pain Score: 3  Pain Location: R hip Pain Descriptors / Indicators: Guarding;Grimacing;Aching Pain Intervention(s): Limited activity within patient's tolerance;Monitored during session;Repositioned        Extremity/Trunk Assessment Upper Extremity Assessment Upper Extremity Assessment: Overall WFL for tasks assessed   Lower Extremity Assessment Lower Extremity Assessment: Defer to PT evaluation       Communication Communication Communication: No difficulties   Cognition Arousal/Alertness: Awake/alert Behavior During Therapy: WFL for tasks assessed/performed Overall Cognitive Status: Within Functional Limits for tasks  assessed                                 General Comments: Able to recall 2/3 posterior hip precautions at start of session      Exercises  Other Exercises Other Exercises: Pt instructed in posterior total hip precautions and how to implement, self care skills, falls prevention strategies, home/routines modifications, DME/AE for LB bathing and dressing tasks, and compression stocking mgt strategies; handout provided        Home Living Family/patient expects to be discharged to:: Private residence Living Arrangements: Children Available Help at Discharge: Family;Available 24 hours/day (Daughter) Type of Home: House Home Access: Stairs to enter [employer] of Steps: 3 Entrance Stairs-Rails: None Home Layout: One level     Bathroom Shower/Tub: Producer, television/film/video: Handicapped height     Home Equipment: Bedside commode;Walker - 2 wheels;Toilet riser          Prior Functioning/Environment Level of Independence: Independent        Comments: Pt reports she was generally independent, but limited 2/2 L hip pain. She assists with care of her 4 grandchildren, and is independent for BADL management. Intermittent use of SPC in recent weeks "when I needed it" (Determined by R hip pain)        OT Problem List: Decreased strength;Decreased range of motion;Decreased activity tolerance;Impaired balance (sitting and/or standing)      OT Treatment/Interventions: Self-care/ADL training;Therapeutic exercise;Energy conservation;DME and/or AE instruction;Therapeutic activities;Patient/family education;Balance training    OT Goals(Current goals can be found in the care plan section) Acute Rehab OT Goals Patient Stated Goal: to return home OT Goal Formulation: With patient Time For Goal Achievement: 08/16/20 Potential to Achieve Goals: Good ADL Goals Pt Will Perform Lower Body Dressing: with modified independence;sit to/from stand;with adaptive equipment Pt Will Transfer to Toilet: with modified independence;ambulating Pt Will Perform Toileting - Clothing Manipulation and hygiene: with modified  independence;sit to/from stand  OT Frequency: Min 1X/week    AM-PAC OT "6 Clicks" Daily Activity     Outcome Measure Help from another person eating meals?: None Help from another person taking care of personal grooming?: A Little Help from another person toileting, which includes using toliet, bedpan, or urinal?: A Little Help from another person bathing (including washing, rinsing, drying)?: A Little Help from another person to put on and taking off regular upper body clothing?: None Help from another person to put on and taking off regular lower body clothing?: A Lot 6 Click Score: 19   End of Session Equipment Utilized During Treatment: Rolling walker Nurse Communication: Mobility status  Activity Tolerance: Patient tolerated treatment well Patient left: in bed;with call bell/phone within reach;with bed alarm set  OT Visit Diagnosis: Unsteadiness on feet (R26.81)                Time: 6629-4765 OT Time Calculation (min): 23 min Charges:  OT General Charges $OT Visit: 1 Visit OT Evaluation $OT Eval Moderate Complexity: 1 Mod OT Treatments $Self Care/Home Management : 8-22 mins  Matthew Folks, OTR/L [facility] [phone]

## 2020-08-02 NOTE — Anesthesia Postprocedure Evaluation (Signed)
Anesthesia Post Note  Patient: Sheila Rivas  Procedure(s) Performed: TOTAL HIP ARTHROPLASTY (Right Hip)  Patient location during evaluation: Nursing Unit Anesthesia Type: Spinal Level of consciousness: awake Pain management: pain level controlled Respiratory status: spontaneous breathing Postop Assessment: no headache Anesthetic complications: no   No complications documented.   Last Vitals:  Vitals:   08/02/20 0407 08/02/20 0754  BP: (!) 122/57 (!) 149/73  Pulse: 74 65  Resp: 16 16  Temp: 36.7 C 37.2 C  SpO2: 97% 99%    Last Pain:  Vitals:   08/02/20 0800  TempSrc:   PainSc: 7                  Jaye Beagle

## 2020-10-21 ENCOUNTER — Other Ambulatory Visit (HOSPITAL_COMMUNITY): Payer: Self-pay | Admitting: Neurosurgery

## 2020-10-21 ENCOUNTER — Other Ambulatory Visit: Payer: Self-pay | Admitting: Neurosurgery

## 2020-10-21 DIAGNOSIS — M4310 Spondylolisthesis, site unspecified: Secondary | ICD-10-CM

## 2020-11-04 ENCOUNTER — Ambulatory Visit
Admission: RE | Admit: 2020-11-04 | Discharge: 2020-11-04 | Disposition: A | Discharge status: Home / Self Care | Payer: Medicare HMO | Source: Ambulatory Visit | Attending: Neurosurgery | Admitting: Neurosurgery

## 2020-11-04 ENCOUNTER — Other Ambulatory Visit: Payer: Self-pay

## 2020-11-04 DIAGNOSIS — M4310 Spondylolisthesis, site unspecified: Secondary | ICD-10-CM | POA: Diagnosis not present

## 2020-11-04 IMAGING — MR MR LUMBAR SPINE W/O CM
5 series · 31 of 48 positions shown · non-contrast
Comparison: MRI lumbar spine [DATE]

CLINICAL DATA: Low back pain. Right leg pain and numbness.

EXAM:
MRI LUMBAR SPINE WITHOUT CONTRAST
TECHNIQUE: Multiplanar, multisequence MR imaging of the lumbar spine was
performed. No intravenous contrast was administered.

[Series 5: T2 · sagittal · 4.0mm · 0.81mm/px · 6 of 17 slices shown (1 of 2)]
[im 1/17]
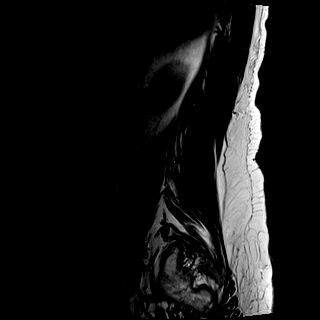
[im 4/17]
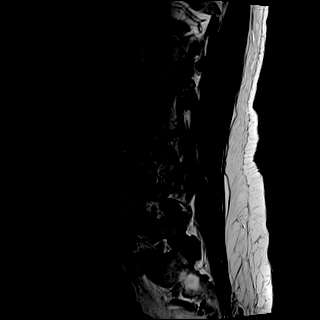
[im 7/17]
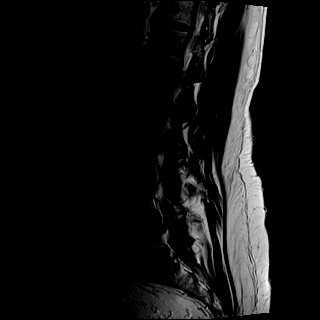
[im 10/17]
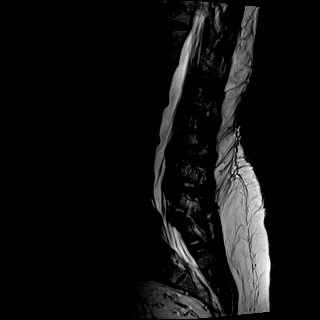
[im 13/17]
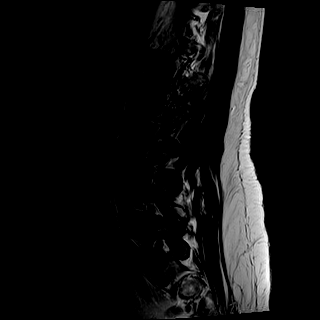
[im 17/17]
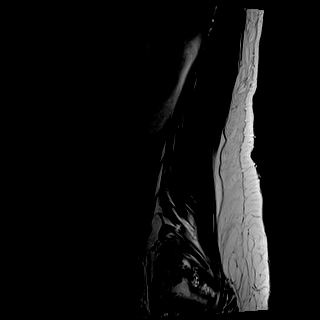

[Series 6: T1 · sagittal · 4.0mm · 0.81mm/px · 7 of 17 slices shown (1 of 2)]
[im 1/17]
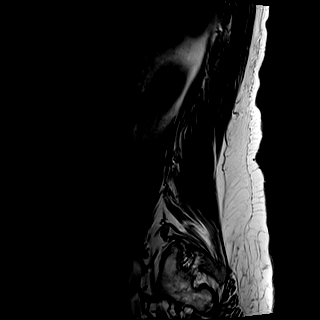
[im 3/17]
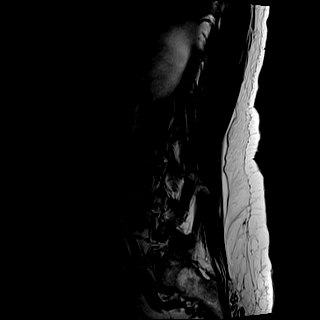
[im 6/17]
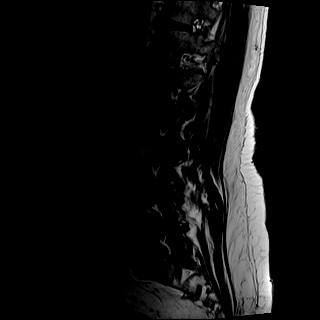
[im 9/17]
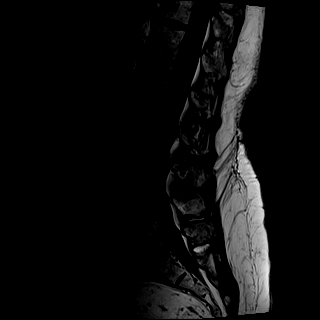
[im 11/17]
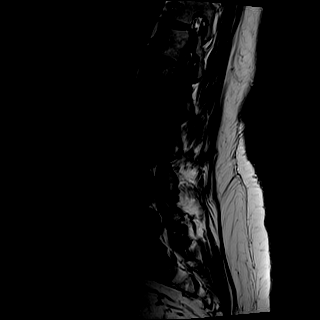
[im 14/17]
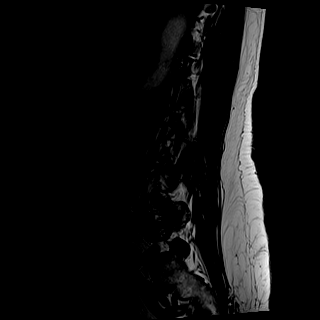
[im 17/17]
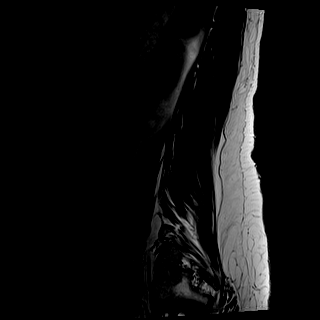

[Series 7: STIR · sagittal · 4.0mm · 0.41mm/px · 2 of 17 slices shown]
[im 1/17]
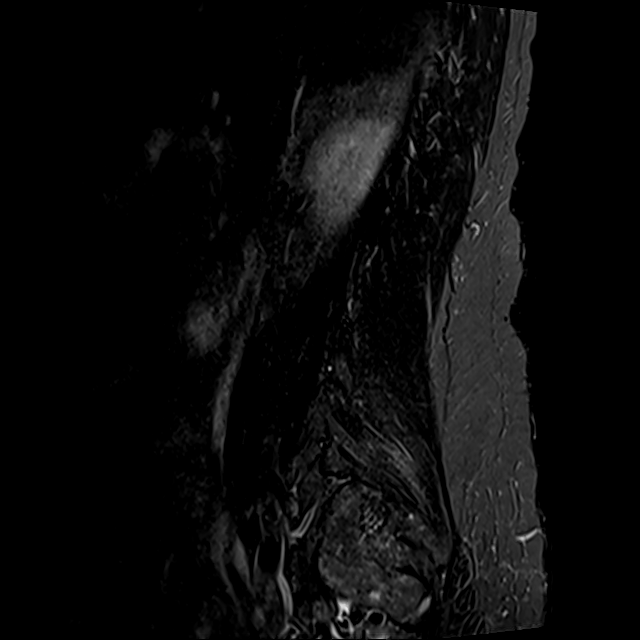
[im 3/17]
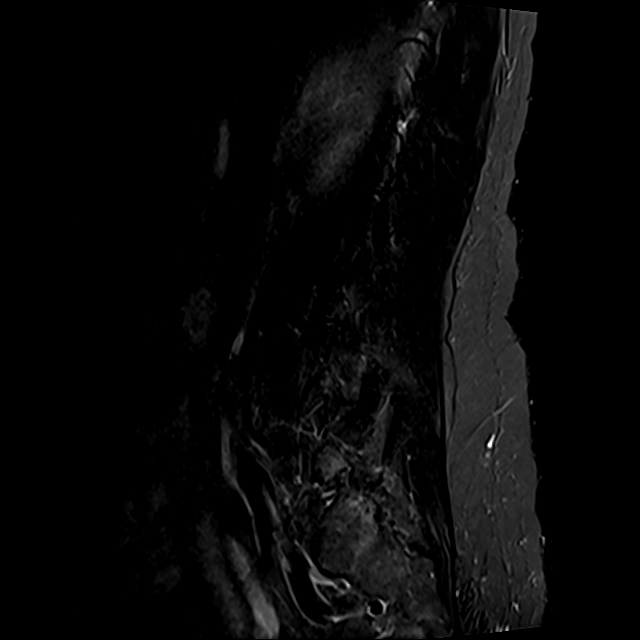

[Series 8: T2 · axial · 4.0mm · 0.78mm/px · z∈[-169,+46]mm · 8 of 33 slices shown (2 of 2)]
[im 1/33]
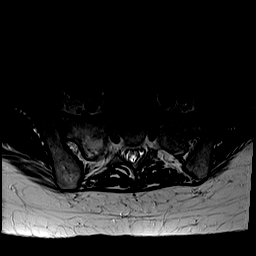
[im 5/33]
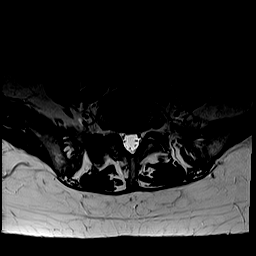
[im 10/33]
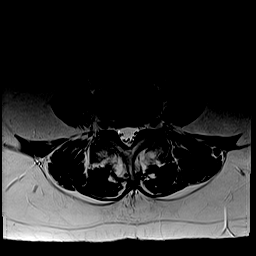
[im 15/33]
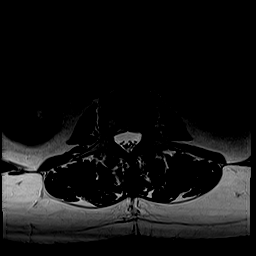
[im 18/33]
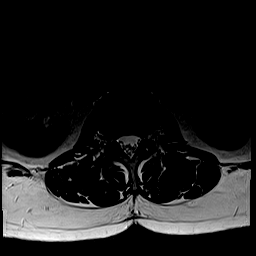
[im 23/33]
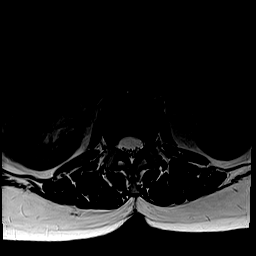
[im 28/33]
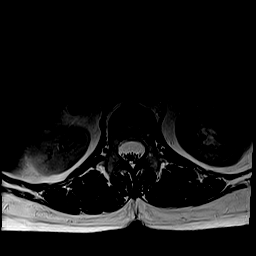
[im 33/33]
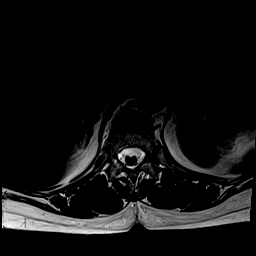

[Series 9: T1 · axial · 4.0mm · 0.39mm/px · z∈[-169,+46]mm · 8 of 33 slices shown (2 of 2)]
[im 1/33]
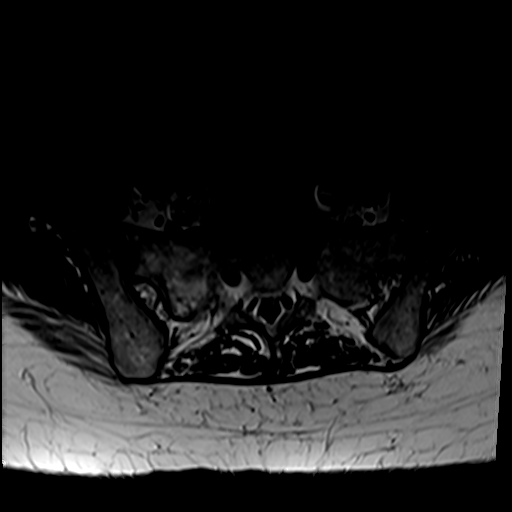
[im 5/33]
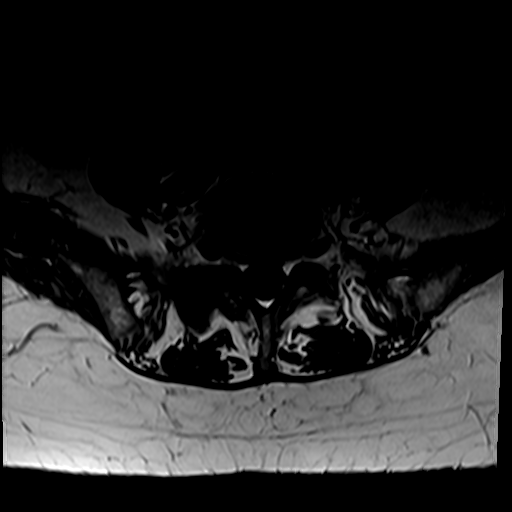
[im 10/33]
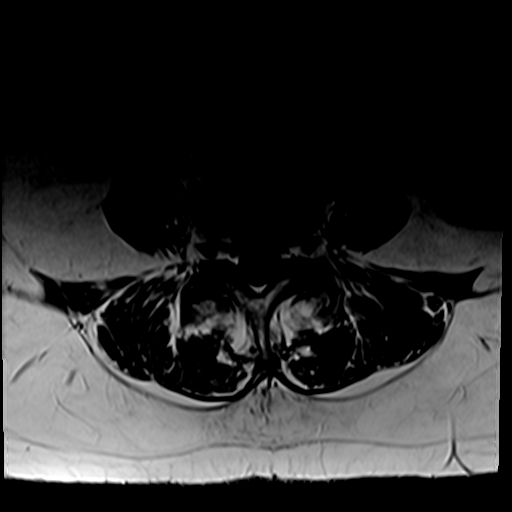
[im 15/33]
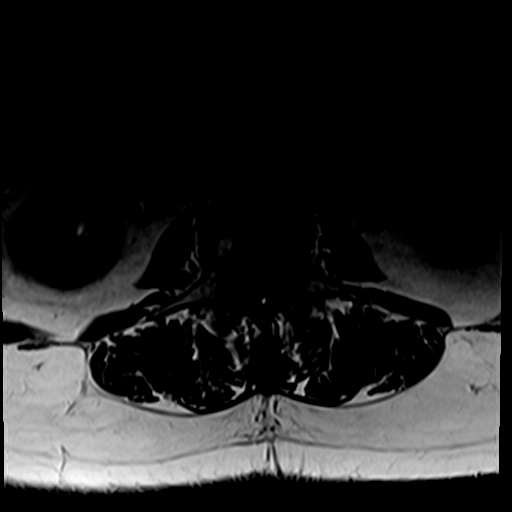
[im 18/33]
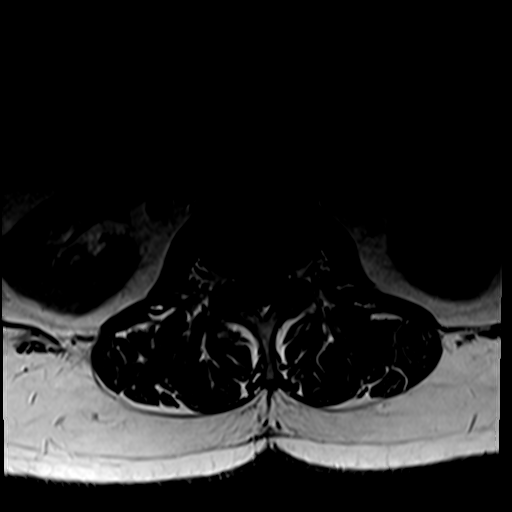
[im 23/33]
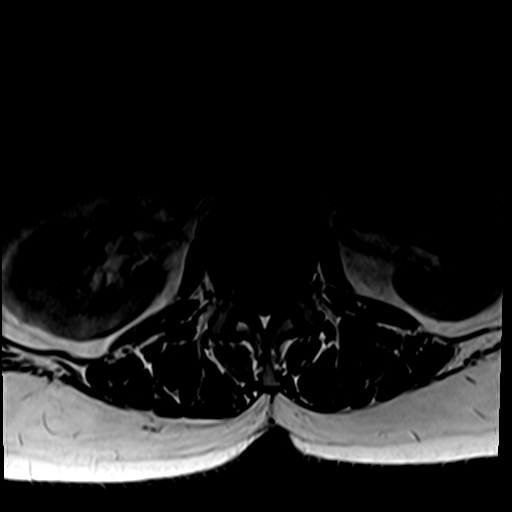
[im 28/33]
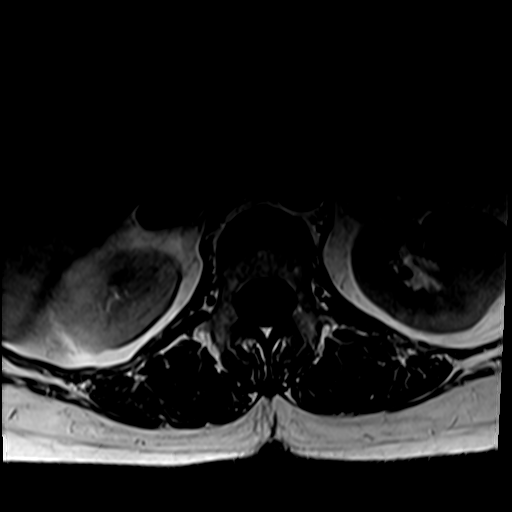
[im 33/33]
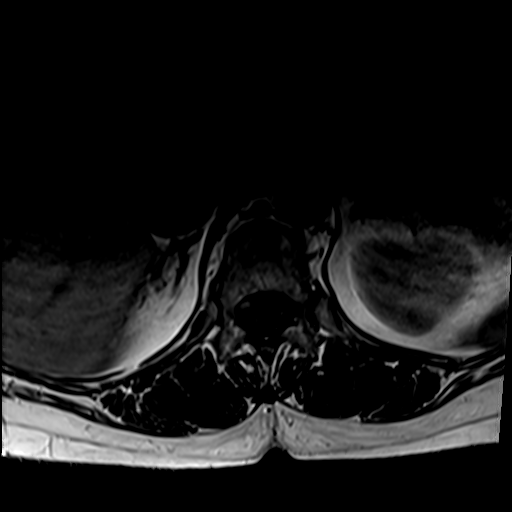

[31 of 48 positions shown; findings below may reference images not displayed]

FINDINGS: Segmentation: 5 non rib-bearing lumbar type vertebral bodies are
present. The lowest fully formed vertebral body is L5.

Alignment: Grade 1 anterolisthesis at L4-5 is stable. No other
significant listhesis is present. Lumbar lordosis preserved.

Vertebrae:  Marrow signal vertebral body heights are normal.

Conus medullaris and cauda equina: Conus extends to the L1 level.
Conus and cauda equina appear normal.

Paraspinal and other soft tissues: Limited imaging the abdomen is
unremarkable. There is no significant adenopathy. No solid organ
lesions are present.

Disc levels:

L1-2: Negative.

L2-3: Broad-based disc protrusion extends into foramina bilaterally.
Central canal is patent. Mild foraminal narrowing bilaterally is
stable.

L3-4: Broad-based disc protrusion mild facet hypertrophy is again
seen bilaterally. Mild foraminal narrowing is stable.

L4-5: Uncovering of a broad-based disc protrusion noted. Moderate
facet hypertrophy is stable. Mild left subarticular narrowing is
stable. Foraminal narrowing bilaterally is also stable.

L5-S1: Asymmetric right-sided facet hypertrophy again noted. Shallow
disc protrusion present without significant stenosis.
IMPRESSION: 1. Stable multilevel spondylosis of the lumbar spine as described.
2. Stable grade 1 anterolisthesis at L4-5 with uncovering of a
broad-based disc protrusion.
3. Mild left subarticular and bilateral foraminal stenosis at L4-5.
4. Mild foraminal narrowing bilaterally at L2-3 and L3-4.
5. Asymmetric right-sided facet hypertrophy at L5-S1 without
significant stenosis.

## 2021-02-10 ENCOUNTER — Other Ambulatory Visit: Payer: Self-pay | Admitting: Neurosurgery

## 2021-02-21 ENCOUNTER — Other Ambulatory Visit
Admission: RE | Admit: 2021-02-21 | Discharge: 2021-02-21 | Disposition: A | Discharge status: Home / Self Care | Payer: Medicare HMO | Source: Ambulatory Visit | Attending: Neurosurgery | Admitting: Neurosurgery

## 2021-02-21 ENCOUNTER — Other Ambulatory Visit: Payer: Self-pay

## 2021-02-21 DIAGNOSIS — I10 Essential (primary) hypertension: Secondary | ICD-10-CM | POA: Insufficient documentation

## 2021-02-21 DIAGNOSIS — Z01810 Encounter for preprocedural cardiovascular examination: Secondary | ICD-10-CM | POA: Diagnosis not present

## 2021-02-21 DIAGNOSIS — Z01818 Encounter for other preprocedural examination: Secondary | ICD-10-CM | POA: Diagnosis present

## 2021-02-21 HISTORY — DX: Bilateral primary osteoarthritis of hip: M16.0

## 2021-02-21 LAB — URINALYSIS, ROUTINE W REFLEX MICROSCOPIC
Bacteria, UA: NONE SEEN
Bilirubin Urine: NEGATIVE
Glucose, UA: NEGATIVE mg/dL
Hgb urine dipstick: NEGATIVE
Ketones, ur: NEGATIVE mg/dL
Nitrite: NEGATIVE
Protein, ur: NEGATIVE mg/dL
Specific Gravity, Urine: 1.006 (ref 1.005–1.030)
pH: 7 (ref 5.0–8.0)

## 2021-02-21 LAB — CBC
HCT: 41.3 % (ref 36.0–46.0)
Hemoglobin: 14.3 g/dL (ref 12.0–15.0)
MCH: 32.5 pg (ref 26.0–34.0)
MCHC: 34.6 g/dL (ref 30.0–36.0)
MCV: 93.9 fL (ref 80.0–100.0)
Platelets: 297 10*3/uL (ref 150–400)
RBC: 4.4 MIL/uL (ref 3.87–5.11)
RDW: 12.2 % (ref 11.5–15.5)
WBC: 9.1 10*3/uL (ref 4.0–10.5)
nRBC: 0 % (ref 0.0–0.2)

## 2021-02-21 LAB — BASIC METABOLIC PANEL
Anion gap: 11 (ref 5–15)
BUN: 27 mg/dL — ABNORMAL HIGH (ref 8–23)
CO2: 26 mmol/L (ref 22–32)
Calcium: 10.1 mg/dL (ref 8.9–10.3)
Chloride: 101 mmol/L (ref 98–111)
Creatinine, Ser: 0.79 mg/dL (ref 0.44–1.00)
GFR, Estimated: >60 mL/min (ref >60)
Glucose, Bld: 98 mg/dL (ref 70–99)
Potassium: 3.4 mmol/L — ABNORMAL LOW (ref 3.5–5.1)
Sodium: 138 mmol/L (ref 135–145)

## 2021-02-21 LAB — SURGICAL PCR SCREEN
MRSA, PCR: NEGATIVE
Staphylococcus aureus: POSITIVE — AB

## 2021-02-21 LAB — TYPE AND SCREEN
ABO/RH(D): A POS
Antibody Screen: NEGATIVE

## 2021-02-21 LAB — PROTIME-INR
INR: 1 (ref 0.8–1.2)
Prothrombin Time: 13.3 seconds (ref 11.4–15.2)

## 2021-02-21 LAB — APTT: aPTT: 28 seconds (ref 24–36)

## 2021-02-21 NOTE — Patient Instructions (Addendum)
Your procedure is scheduled on: Wednesday, November 9 Report to the Registration Desk on the 1st floor of the [facility] To find out your arrival time, please call [phone] between 1PM - 3PM on: Tuesday, November 8  REMEMBER: Instructions that are not followed completely may result in serious medical risk, up to and including death; or upon the discretion of your surgeon and anesthesiologist your surgery may need to be rescheduled.  Do not eat food after midnight the night before surgery.  No gum chewing, lozengers or hard candies.  You may however, drink CLEAR liquids up to 2 hours before you are scheduled to arrive for your surgery. Do not drink anything within 2 hours of your scheduled arrival time.  Clear liquids include: - water  - apple juice without pulp - gatorade (not RED, PURPLE, OR BLUE) - black coffee or tea (Do NOT add milk or creamers to the coffee or tea) Do NOT drink anything that is not on this list.  TAKE THESE MEDICATIONS THE MORNING OF SURGERY WITH A SIP OF WATER:  Gabapentin  One week prior to surgery: starting November 2 Stop CELEBREX, Anti-inflammatories (NSAIDS) such as Advil, Aleve, Ibuprofen, Motrin, Naproxen, Naprosyn and Aspirin based products such as Excedrin, Goodys Powder, BC Powder. Stop ANY OVER THE COUNTER supplements until after surgery. Stop zinc, valerian root, probiotic, super B, fish oil, magnesium, ginkgo, vitamin D, vitamin C, calcium You may however, continue to take Tylenol if needed for pain up until the day of surgery.  No Alcohol for 24 hours before or after surgery.  On the morning of surgery brush your teeth with toothpaste and water, you may rinse your mouth with mouthwash if you wish. Do not swallow any toothpaste or mouthwash.  Use CHG Soap as directed on instruction sheet.  Do not wear jewelry, make-up, hairpins, clips or nail polish.  Do not wear lotions, powders, or perfumes.   Do not shave body from the neck down  48 hours prior to surgery just in case you cut yourself which could leave a site for infection.  Also, freshly shaved skin may become irritated if using the CHG soap.  Contact lenses, hearing aids and dentures may not be worn into surgery.  Do not bring valuables to the hospital. [facility] is not responsible for any missing/lost belongings or valuables.   Notify your doctor if there is any change in your medical condition (cold, fever, infection).  Wear comfortable clothing (specific to your surgery type) to the hospital.  After surgery, you can help prevent lung complications by doing breathing exercises.  Take deep breaths and cough every 1-2 hours. Your doctor may order a device called an Incentive Spirometer to help you take deep breaths.  If you are being admitted to the hospital overnight, leave your suitcase in the car. After surgery it may be brought to your room.  If you are being discharged the day of surgery, you will not be allowed to drive home. You will need a responsible adult (18 years or older) to drive you home and stay with you that night.   If you are taking public transportation, you will need to have a responsible adult (18 years or older) with you. Please confirm with your physician that it is acceptable to use public transportation.   Please call the Pre-admissions Testing Dept. at [phone] if you have any questions about these instructions.  Surgery Visitation Policy:  Patients undergoing a surgery or procedure may have one family  member or support person with them as long as that person is not COVID-19 positive or experiencing its symptoms.  That person may remain in the waiting area during the procedure and may rotate out with other people.  Inpatient Visitation:    Visiting hours are 7 a.m. to 8 p.m. Up to two visitors ages 16+ are allowed at one time in a patient room. The visitors may rotate out with other people during the day. Visitors must  check out when they leave, or other visitors will not be allowed. One designated support person may remain overnight. The visitor must pass COVID-19 screenings, use hand sanitizer when entering and exiting the patient's room and wear a mask at all times, including in the patient's room. Patients must also wear a mask when staff or their visitor are in the room. Masking is required regardless of vaccination status.

## 2021-02-22 LAB — URINE CULTURE: Culture: <10000 — AB

## 2021-02-23 ENCOUNTER — Other Ambulatory Visit: Payer: Medicare HMO

## 2021-02-27 ENCOUNTER — Other Ambulatory Visit
Admission: RE | Admit: 2021-02-27 | Discharge: 2021-02-27 | Disposition: A | Discharge status: Home / Self Care | Payer: Medicare HMO | Source: Ambulatory Visit | Attending: Neurosurgery | Admitting: Neurosurgery

## 2021-02-27 ENCOUNTER — Other Ambulatory Visit: Payer: Self-pay

## 2021-02-27 DIAGNOSIS — Z01812 Encounter for preprocedural laboratory examination: Secondary | ICD-10-CM | POA: Insufficient documentation

## 2021-02-27 DIAGNOSIS — Z20822 Contact with and (suspected) exposure to covid-19: Secondary | ICD-10-CM | POA: Insufficient documentation

## 2021-02-28 LAB — SARS CORONAVIRUS 2 (TAT 6-24 HRS): SARS Coronavirus 2: NEGATIVE

## 2021-02-28 MED ORDER — CEFAZOLIN SODIUM-DEXTROSE 2-4 GM/100ML-% IV SOLN
2.0000 g | INTRAVENOUS | Status: AC
  Administered 2021-03-01: 2 g via INTRAVENOUS

## 2021-02-28 MED ORDER — FAMOTIDINE 20 MG PO TABS: 20.0000 mg | ORAL_TABLET | Freq: Once | ORAL | Status: AC

## 2021-02-28 MED ORDER — LACTATED RINGERS IV SOLN: INTRAVENOUS | Status: DC

## 2021-02-28 MED ORDER — CHLORHEXIDINE GLUCONATE 0.12 % MT SOLN: 15.0000 mL | Freq: Once | OROMUCOSAL | Status: AC

## 2021-02-28 MED ORDER — ORAL CARE MOUTH RINSE: 15.0000 mL | Freq: Once | OROMUCOSAL | Status: AC

## 2021-02-28 MED ORDER — VANCOMYCIN HCL 1250 MG/250ML IV SOLN
1250.0000 mg | INTRAVENOUS | Status: AC
  Administered 2021-03-01: 1250 mg via INTRAVENOUS
  Filled 2021-02-28 (×2): qty 250

## 2021-02-28 NOTE — Progress Notes (Signed)
Pharmacy Antibiotic Note  Sheila Rivas is a 70 y.o. female admitted on (Not on file) with surgical prophylaxis.  Pharmacy has been consulted for Vancomycin, Cefazolin dosing.  TBW = 77.6 kg   Plan: Vancomycin 1250 mg (15 mg/kg) IV X 1 60 min pre-op ordered for 11/09 @ 0500.  Cefazolin 2 gm IV X 1 60 min pre-op ordered for 11/09 @ 0500.     No data recorded.  No results for input(s): WBC, CREATININE, LATICACIDVEN, VANCOTROUGH, VANCOPEAK, VANCORANDOM, GENTTROUGH, GENTPEAK, GENTRANDOM, TOBRATROUGH, TOBRAPEAK, TOBRARND, AMIKACINPEAK, AMIKACINTROU, AMIKACIN in the last 168 hours.  Estimated Creatinine Clearance: 62.4 mL/min (by C-G formula based on SCr of 0.79 mg/dL).    Allergies  Allergen Reactions   Hydromorphone Itching   Tramadol Itching    Redness, red puffy eyes    Antimicrobials this admission:   >>    >>   Dose adjustments this admission:   Microbiology results:  BCx:   UCx:    Sputum:    MRSA PCR:  Thank you for allowing pharmacy to be a part of this patient's care.  Kebra Lowrimore D 02/28/2021 11:56 PM

## 2021-03-01 ENCOUNTER — Inpatient Hospital Stay: Payer: Medicare HMO | Admitting: Urgent Care

## 2021-03-01 ENCOUNTER — Inpatient Hospital Stay: Payer: Medicare HMO

## 2021-03-01 ENCOUNTER — Encounter
Admission: RE | Disposition: A | Discharge status: Home / Self Care | Payer: Self-pay | Source: Home / Self Care | Attending: Neurosurgery

## 2021-03-01 ENCOUNTER — Other Ambulatory Visit: Payer: Self-pay

## 2021-03-01 ENCOUNTER — Inpatient Hospital Stay
Admission: RE | Admit: 2021-03-01 | Discharge: 2021-03-03 | DRG: 460 | Disposition: A | Discharge status: Home Health | Payer: Medicare HMO | Attending: Neurosurgery | Admitting: Neurosurgery

## 2021-03-01 ENCOUNTER — Encounter: Payer: Self-pay | Admitting: Neurosurgery

## 2021-03-01 DIAGNOSIS — K219 Gastro-esophageal reflux disease without esophagitis: Secondary | ICD-10-CM | POA: Diagnosis present

## 2021-03-01 DIAGNOSIS — G4730 Sleep apnea, unspecified: Secondary | ICD-10-CM | POA: Diagnosis present

## 2021-03-01 DIAGNOSIS — I10 Essential (primary) hypertension: Secondary | ICD-10-CM | POA: Diagnosis present

## 2021-03-01 DIAGNOSIS — M5416 Radiculopathy, lumbar region: Secondary | ICD-10-CM | POA: Diagnosis present

## 2021-03-01 DIAGNOSIS — M5442 Lumbago with sciatica, left side: Secondary | ICD-10-CM | POA: Diagnosis present

## 2021-03-01 DIAGNOSIS — G8929 Other chronic pain: Secondary | ICD-10-CM | POA: Diagnosis present

## 2021-03-01 DIAGNOSIS — Z20822 Contact with and (suspected) exposure to covid-19: Secondary | ICD-10-CM | POA: Diagnosis present

## 2021-03-01 DIAGNOSIS — M5136 Other intervertebral disc degeneration, lumbar region: Secondary | ICD-10-CM | POA: Diagnosis present

## 2021-03-01 DIAGNOSIS — Z419 Encounter for procedure for purposes other than remedying health state, unspecified: Secondary | ICD-10-CM

## 2021-03-01 DIAGNOSIS — M4316 Spondylolisthesis, lumbar region: Secondary | ICD-10-CM | POA: Diagnosis present

## 2021-03-01 DIAGNOSIS — Z885 Allergy status to narcotic agent status: Secondary | ICD-10-CM | POA: Diagnosis not present

## 2021-03-01 DIAGNOSIS — Z981 Arthrodesis status: Secondary | ICD-10-CM

## 2021-03-01 HISTORY — PX: ANTERIOR LATERAL LUMBAR FUSION WITH PERCUTANEOUS SCREW 1 LEVEL: SHX5553

## 2021-03-01 LAB — GLUCOSE, CAPILLARY: Glucose-Capillary: 203 mg/dL — ABNORMAL HIGH (ref 70–99)

## 2021-03-01 IMAGING — CT DG LUMBAR SPINE 2-3V
3 series · 16 of 33 positions shown, 19 images · IV contrast (agent unspecified)
Comparison: None.

CLINICAL DATA: L4-5 XLIF with posterior screw placement

EXAM:
DG C-ARM 1-60 MIN; LUMBAR SPINE - 2-3 VIEW
CONTRAST:  None
FLUOROSCOPY TIME:  Fluoroscopy Time:  28 seconds
Radiation Exposure Index (if provided by the fluoroscopic device):
20.4 mGy
Number of Acquired Spot Images: 0

[Series 6: — · sagittal · 0.36mm/px · 5 of 156 slices shown, 6 images (1 of 3)]
[im 52/156  bone]
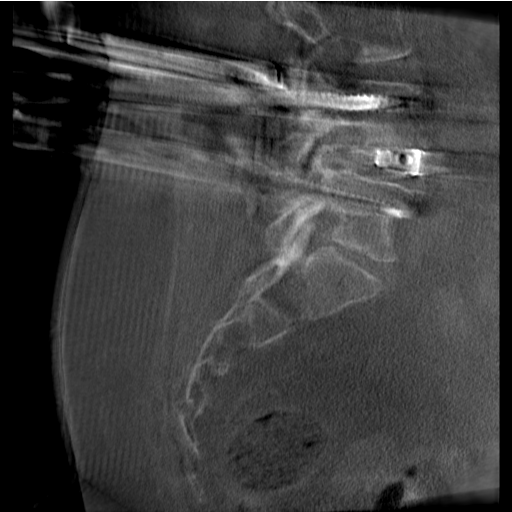
[im 65/156  bone]
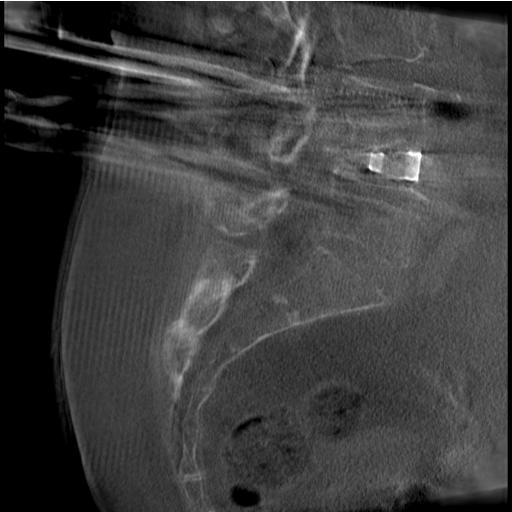
[im 78/156  soft-tissue]
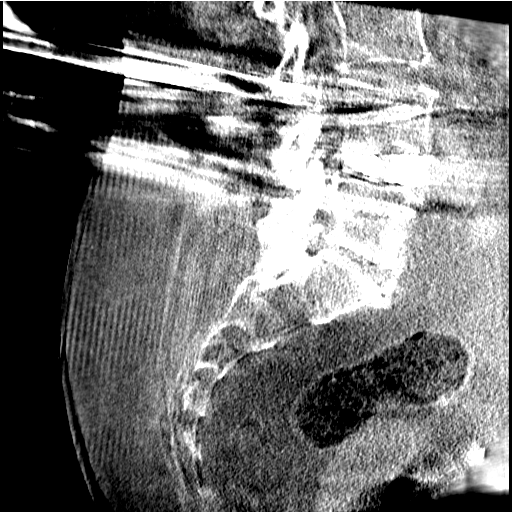
[im 78/156  bone]
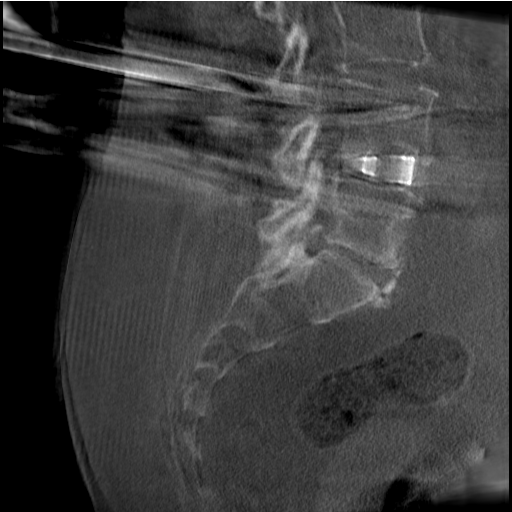
[im 91/156  bone]
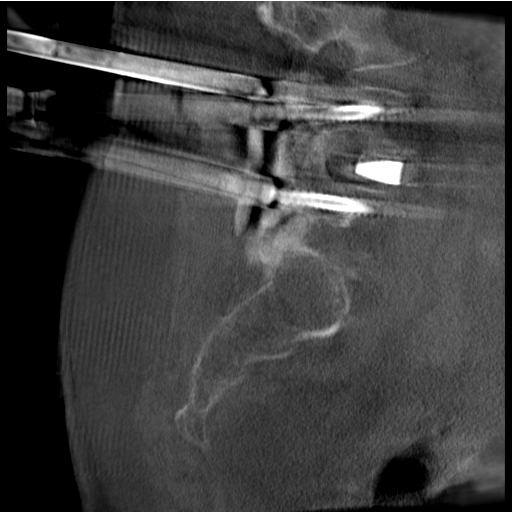
[im 104/156  bone]
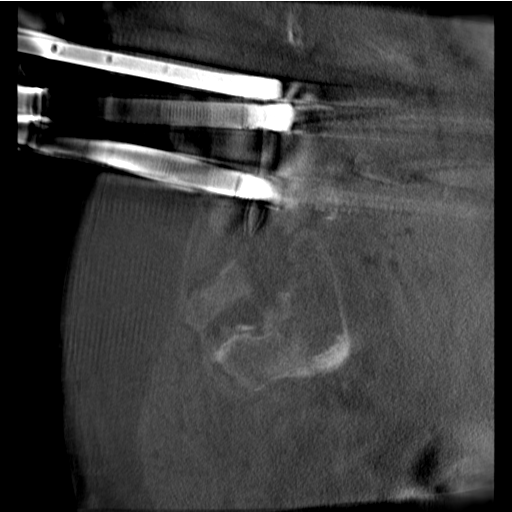

[Series 6: — · axial · 0.36mm/px · z∈[-77,+77]mm · 8 of 170 slices shown, 10 images (2 of 3)]
[im 14/170  soft-tissue]
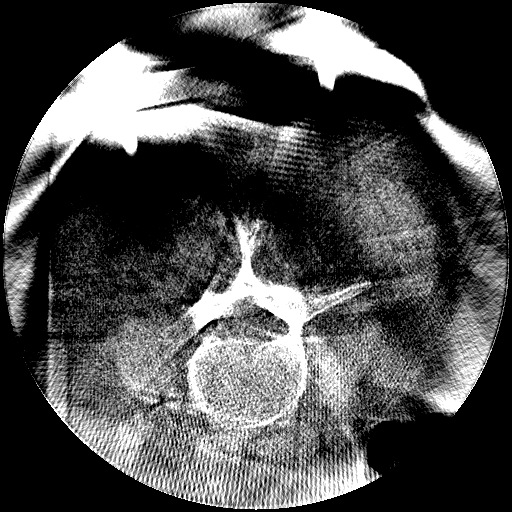
[im 14/170  bone]
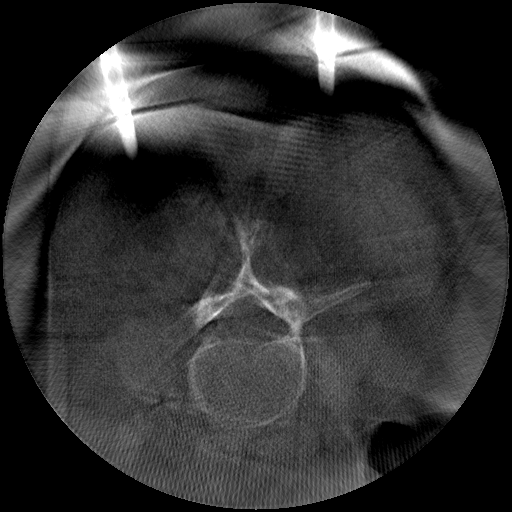
[im 40/170  bone]
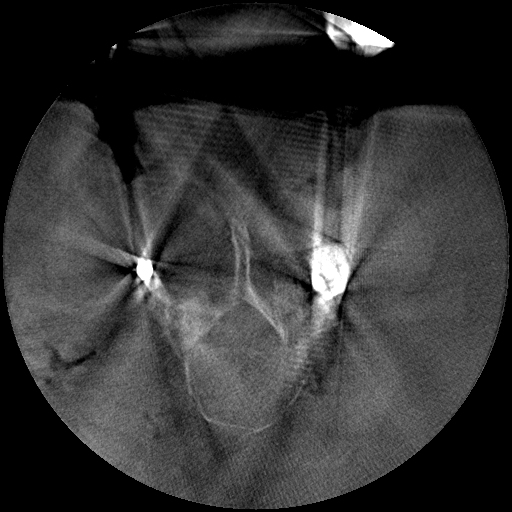
[im 53/170  bone]
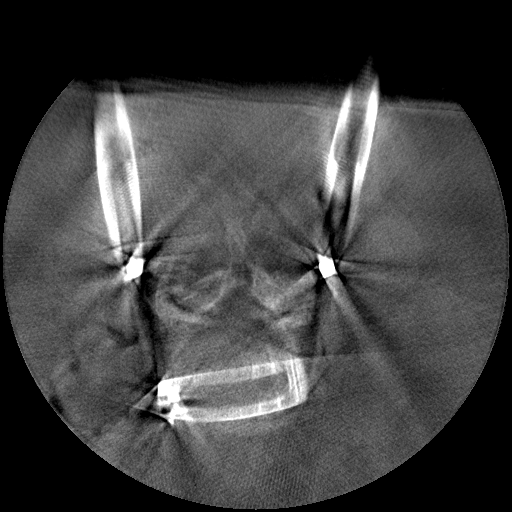
[im 79/170  bone]
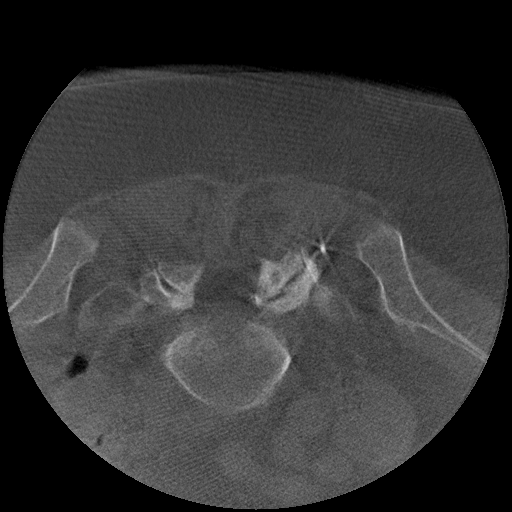
[im 92/170  soft-tissue]
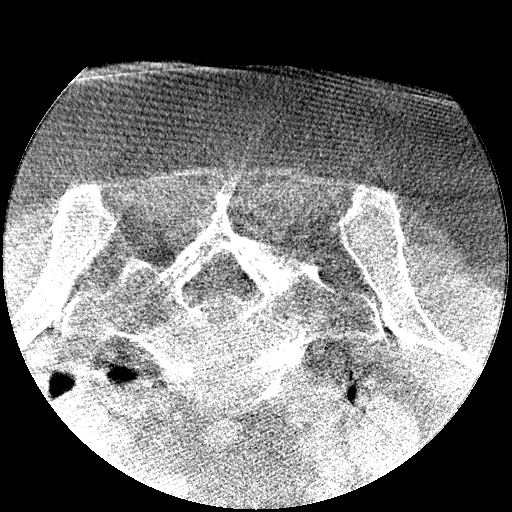
[im 92/170  bone]
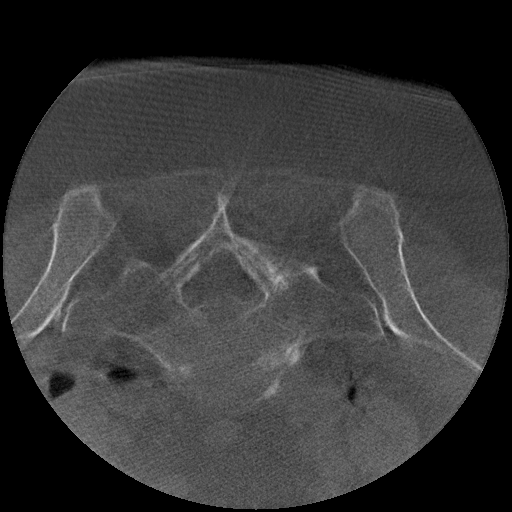
[im 118/170  bone]
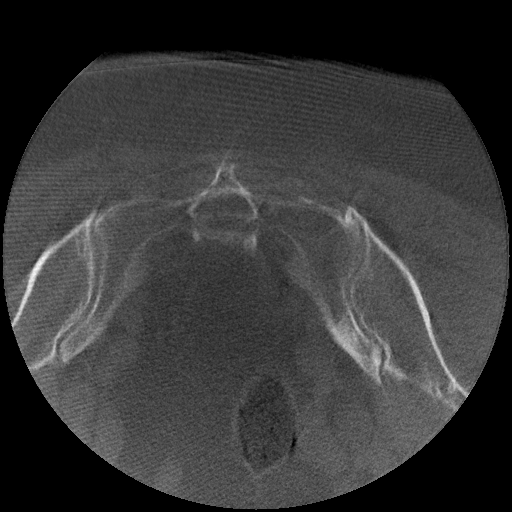
[im 131/170  bone]
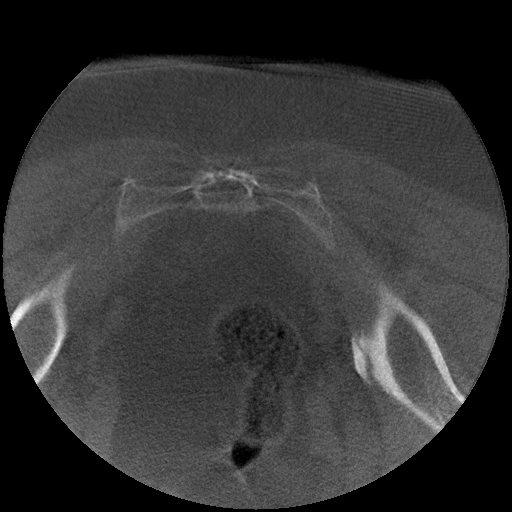
[im 157/170  bone]
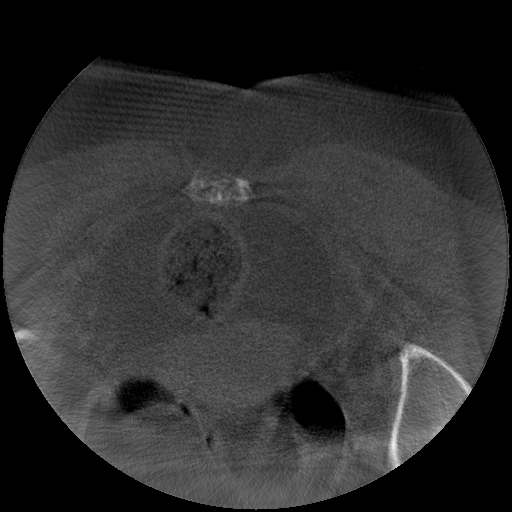

[Series 6: — · coronal · 0.36mm/px · 3 of 122 slices shown (3 of 3)]
[im 25/122  bone]
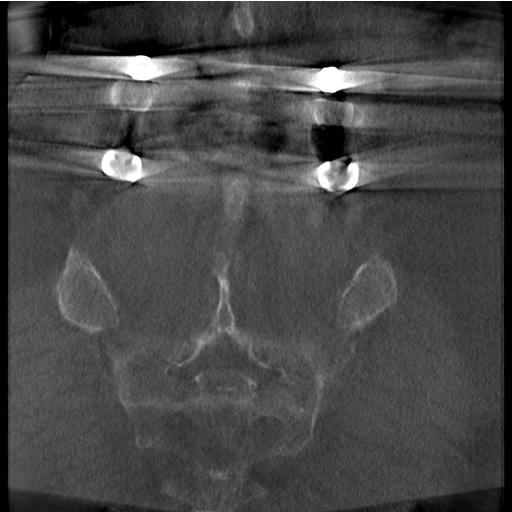
[im 49/122  bone]
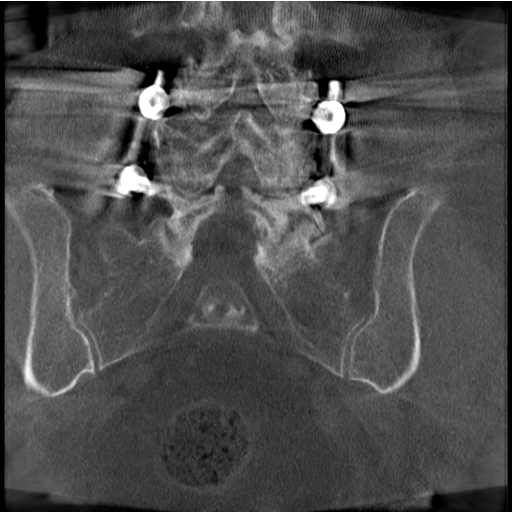
[im 73/122  bone]
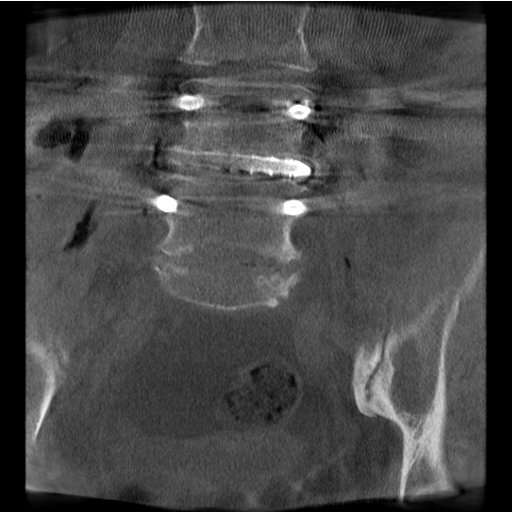

[16 of 33 positions shown; findings below may reference images not displayed]

FINDINGS: Intraoperative portable radiographs obtained in the operating room
show posterior screw and rod fixation of L4-5 with interbody fusion.
Alignment appears anatomic.
IMPRESSION: Status post posterior screw and rod fixation of L4-5 with interbody
fusion.

## 2021-03-01 IMAGING — CT DG C-ARM 1-60 MIN
3 series · 16 of 33 positions shown, 19 images · IV contrast (agent unspecified)
Comparison: None.

CLINICAL DATA: Status post L4-5 XLIF.

EXAM:
DG C-ARM 1-60 MIN
CONTRAST:  None
FLUOROSCOPY TIME:  Fluoroscopy Time:  28 seconds
Radiation Exposure Index (if provided by the fluoroscopic device):
20.4 mGy
Number of Acquired Spot Images: 0

[Series 6: — · coronal · 0.36mm/px · 3 of 122 slices shown (1 of 3)]
[im 25/122  bone]
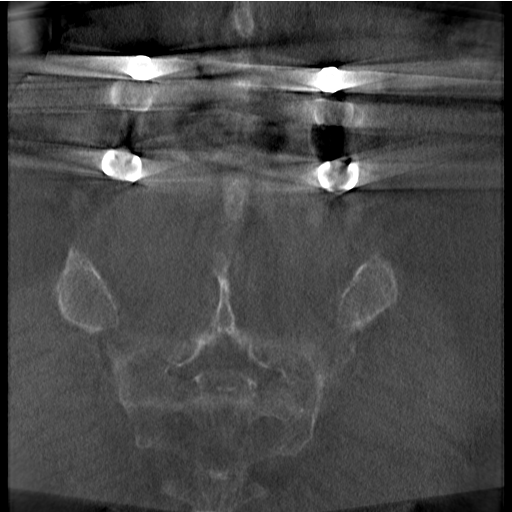
[im 49/122  bone]
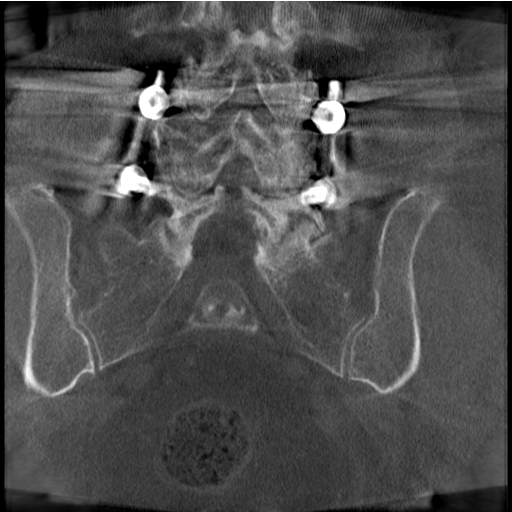
[im 73/122  bone]
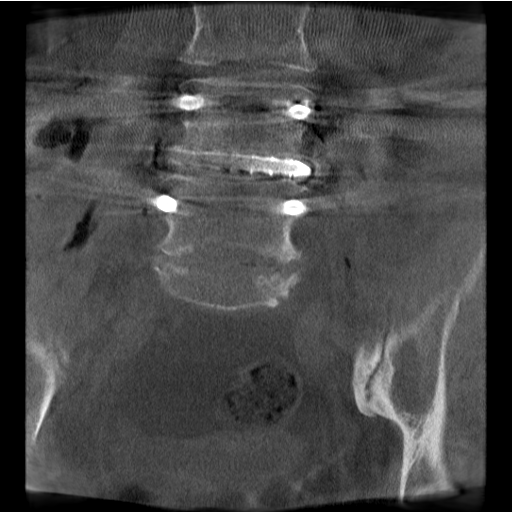

[Series 6: — · axial · 0.36mm/px · z∈[-77,+77]mm · 8 of 170 slices shown, 10 images (2 of 3)]
[im 14/170  soft-tissue]
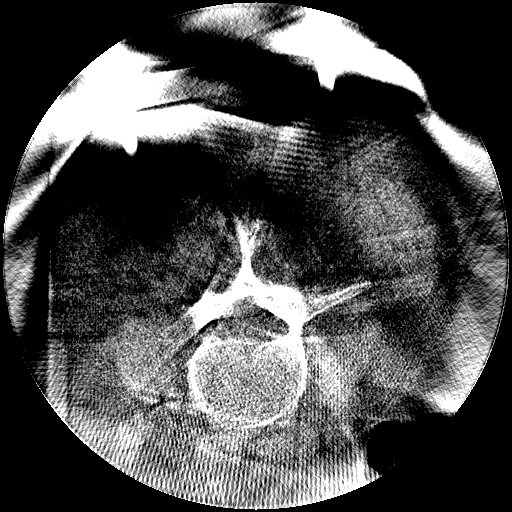
[im 14/170  bone]
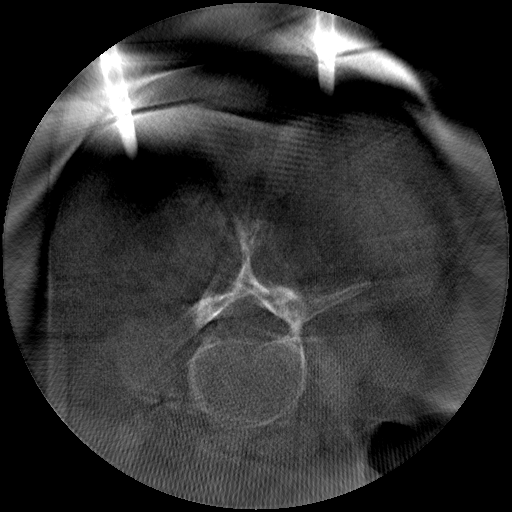
[im 27/170  bone]
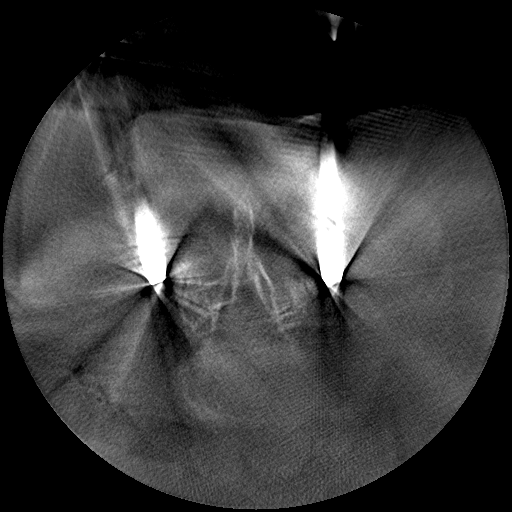
[im 66/170  bone]
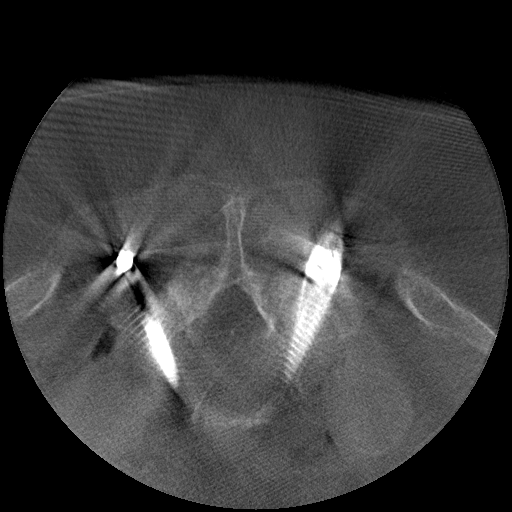
[im 79/170  bone]
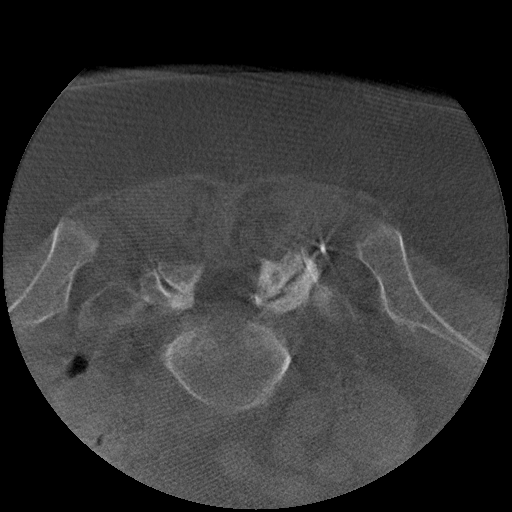
[im 92/170  soft-tissue]
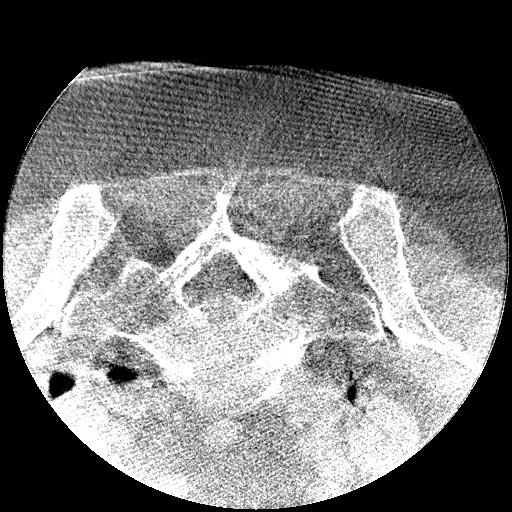
[im 92/170  bone]
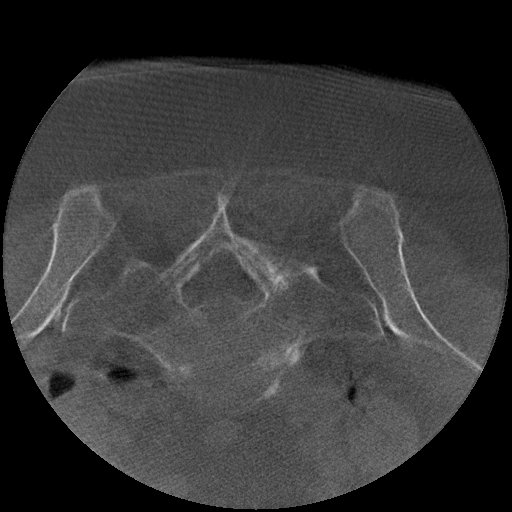
[im 105/170  bone]
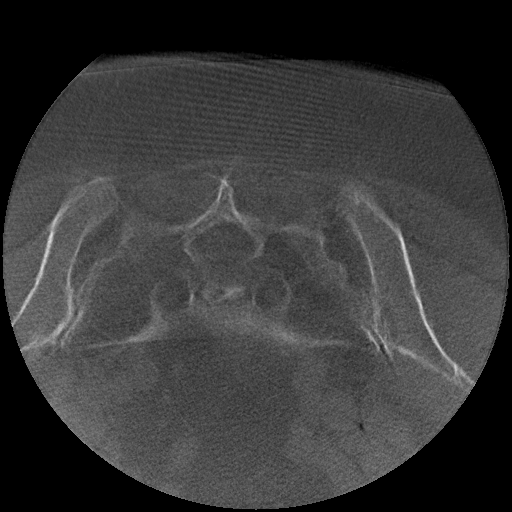
[im 144/170  bone]
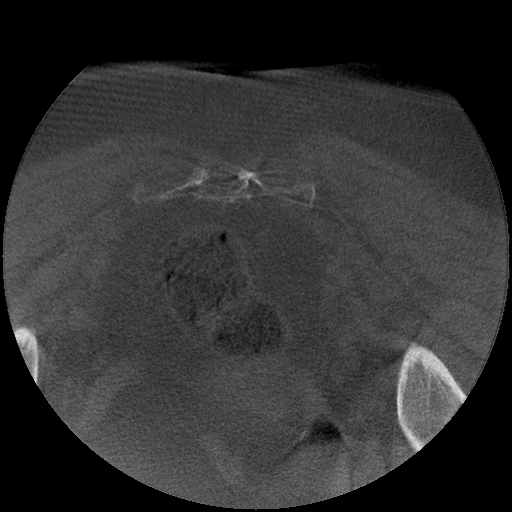
[im 157/170  bone]
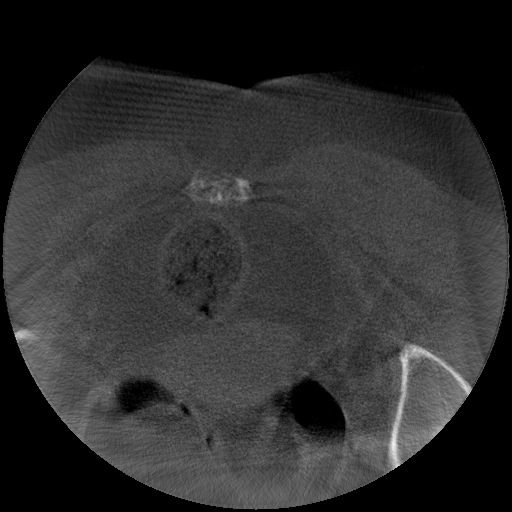

[Series 6: — · sagittal · 0.36mm/px · 5 of 156 slices shown, 6 images (3 of 3)]
[im 52/156  bone]
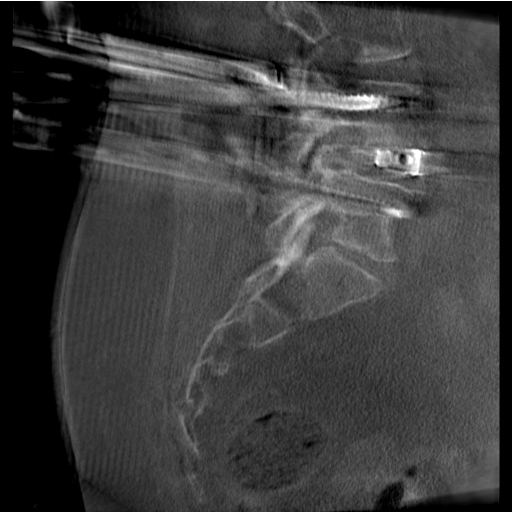
[im 65/156  bone]
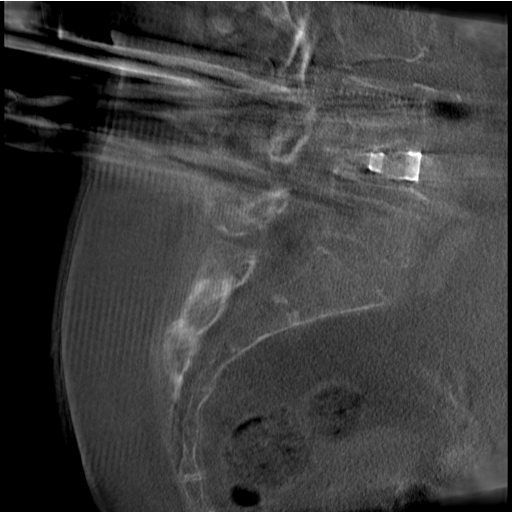
[im 78/156  soft-tissue]
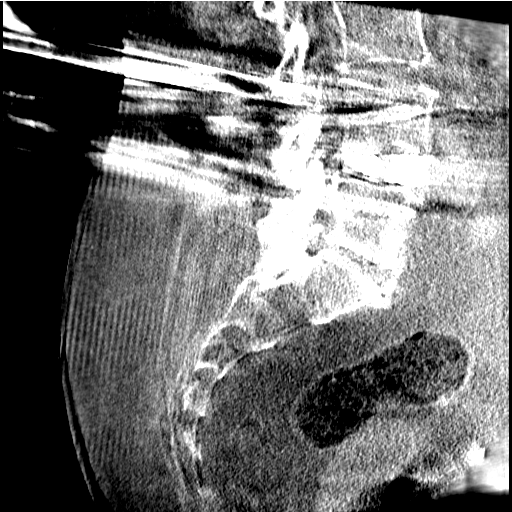
[im 78/156  bone]
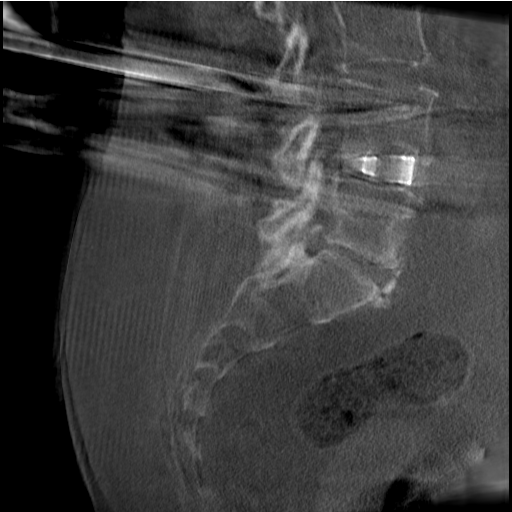
[im 91/156  bone]
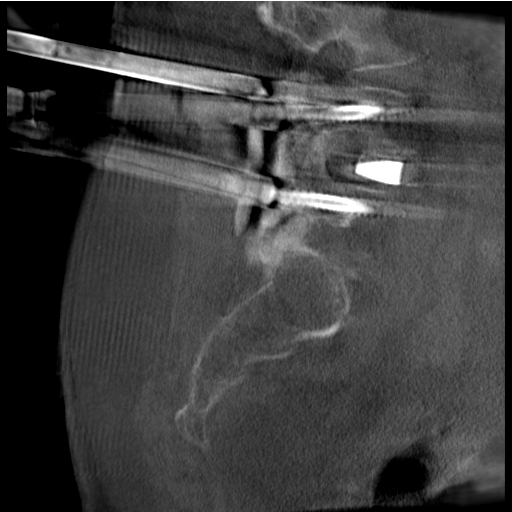
[im 104/156  bone]
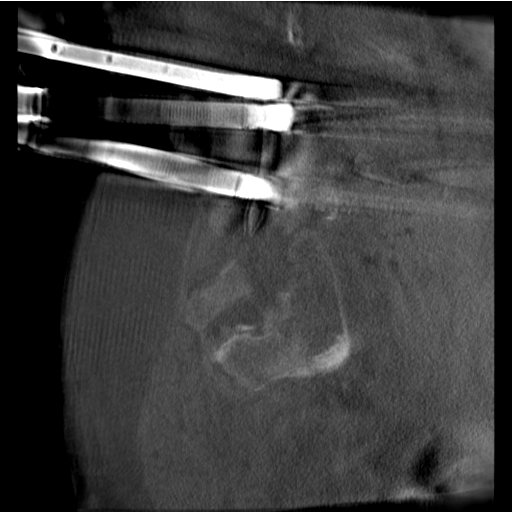

[16 of 33 positions shown; findings below may reference images not displayed]

FINDINGS: Images show bilateral pedicle screw and rod fixation of the L4-5
vertebra with interbody fusion. Hardware components are in anatomic
alignment. No signs of periprosthetic fracture.
IMPRESSION: 1. Status post posterior hardware fixation with interbody fusion at
L4-5.

## 2021-03-01 IMAGING — CT DG C-ARM 1-60 MIN
3 series · 16 of 33 positions shown, 19 images · IV contrast (agent unspecified)
Comparison: None.

CLINICAL DATA: L4-5 XLIF with posterior screw placement

EXAM:
DG C-ARM 1-60 MIN; LUMBAR SPINE - 2-3 VIEW
CONTRAST:  None
FLUOROSCOPY TIME:  Fluoroscopy Time:  28 seconds
Radiation Exposure Index (if provided by the fluoroscopic device):
20.4 mGy
Number of Acquired Spot Images: 0

[Series 6: — · coronal · 0.36mm/px · 3 of 122 slices shown (1 of 3)]
[im 25/122  bone]
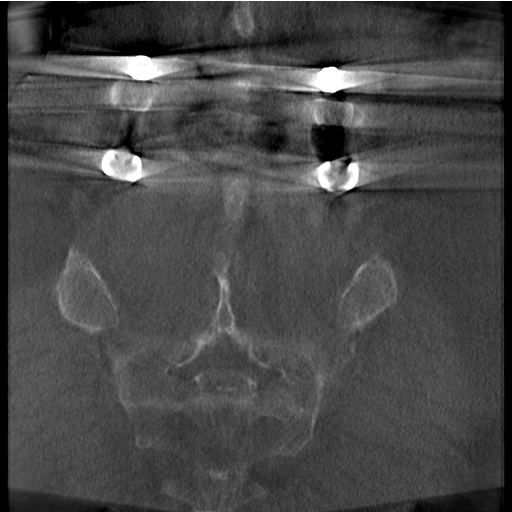
[im 49/122  bone]
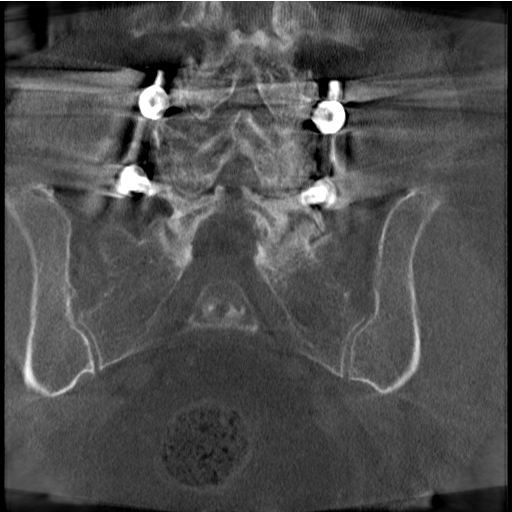
[im 73/122  bone]
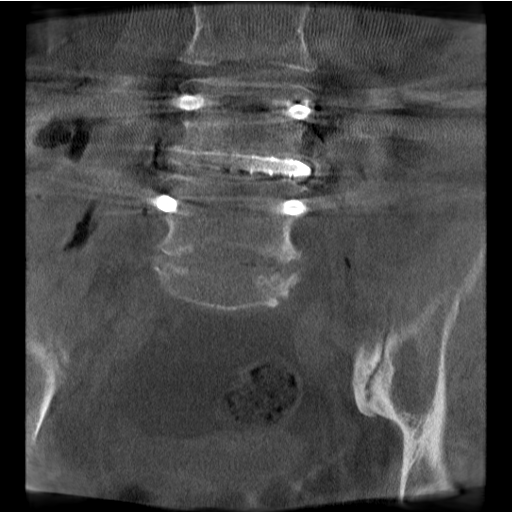

[Series 6: — · sagittal · 0.36mm/px · 5 of 156 slices shown, 6 images (2 of 3)]
[im 52/156  bone]
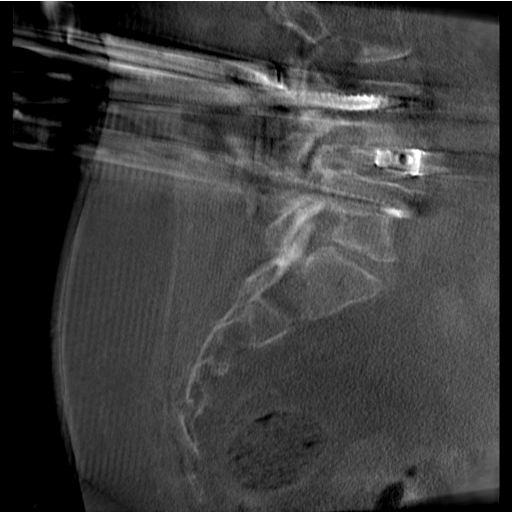
[im 65/156  bone]
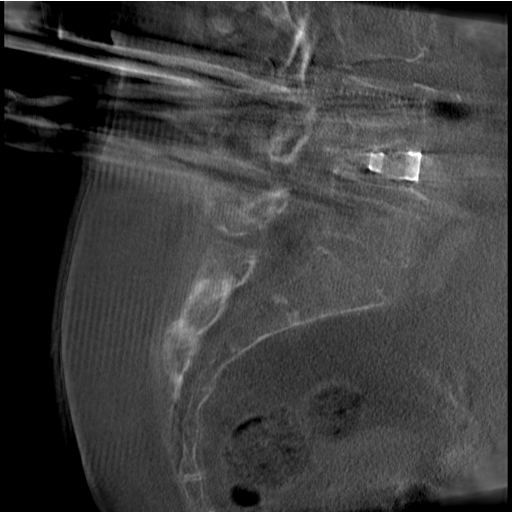
[im 78/156  soft-tissue]
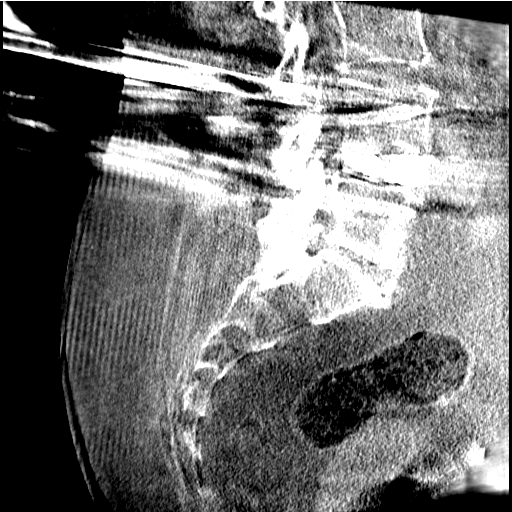
[im 78/156  bone]
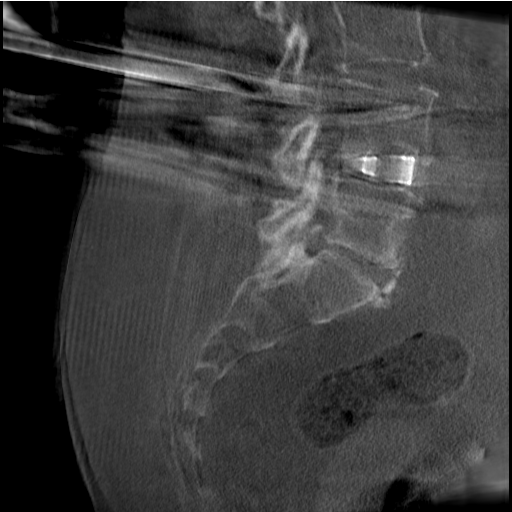
[im 91/156  bone]
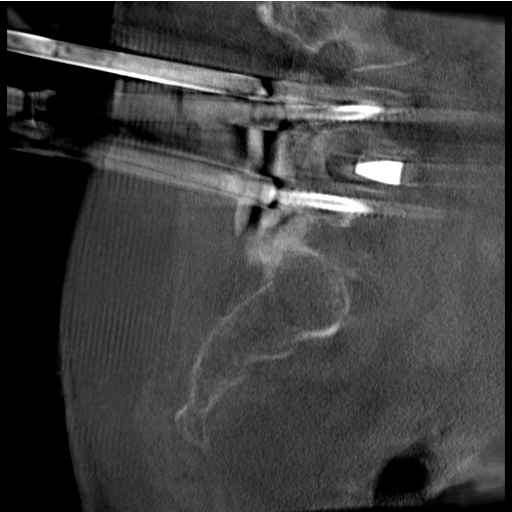
[im 104/156  bone]
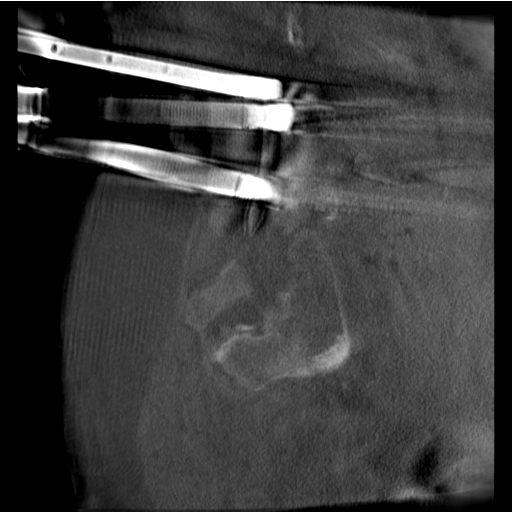

[Series 6: — · axial · 0.36mm/px · z∈[-77,+77]mm · 8 of 170 slices shown, 10 images (3 of 3)]
[im 14/170  soft-tissue]
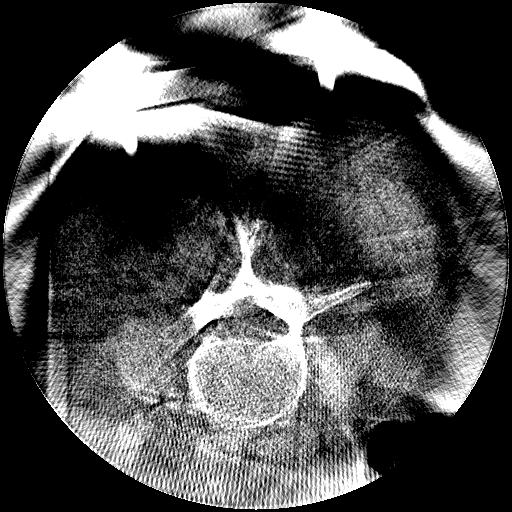
[im 14/170  bone]
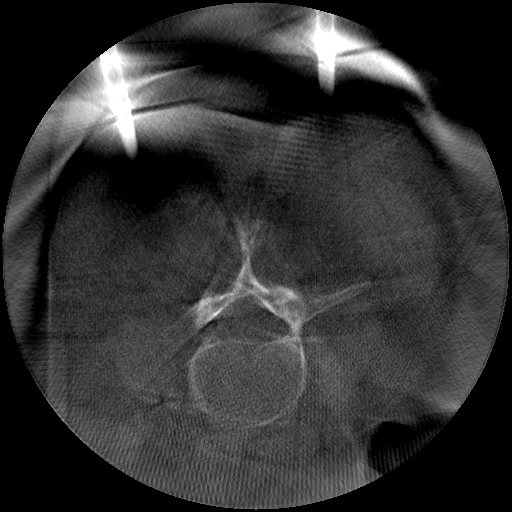
[im 40/170  bone]
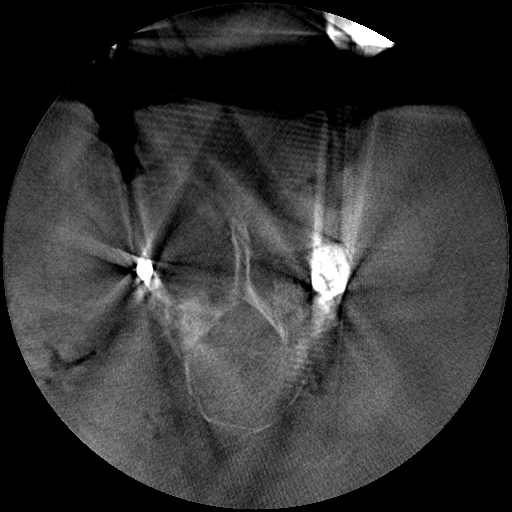
[im 53/170  bone]
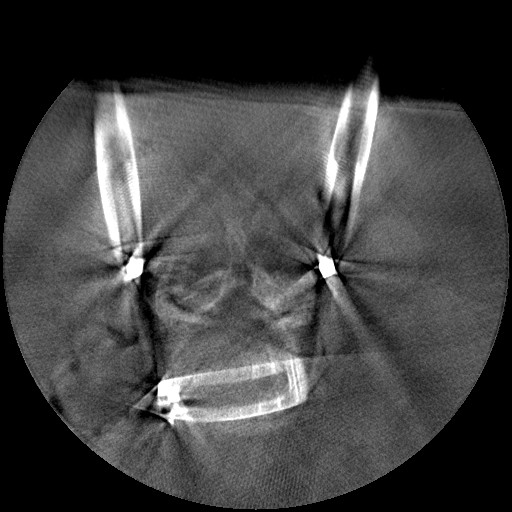
[im 79/170  bone]
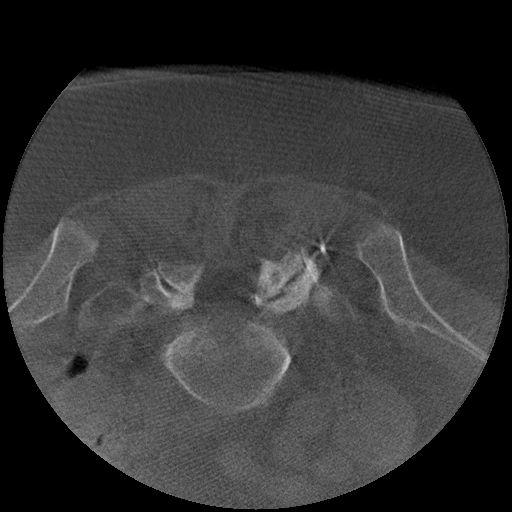
[im 92/170  soft-tissue]
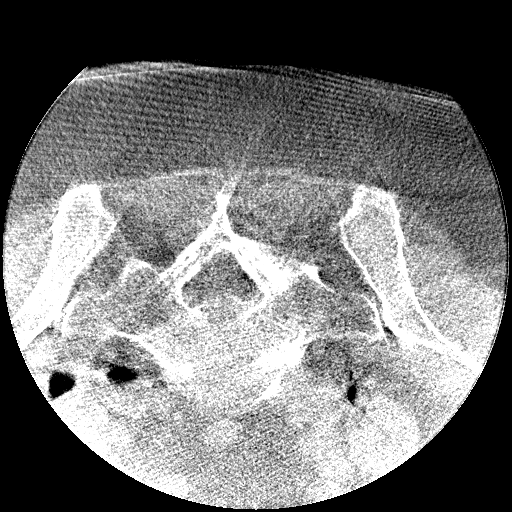
[im 92/170  bone]
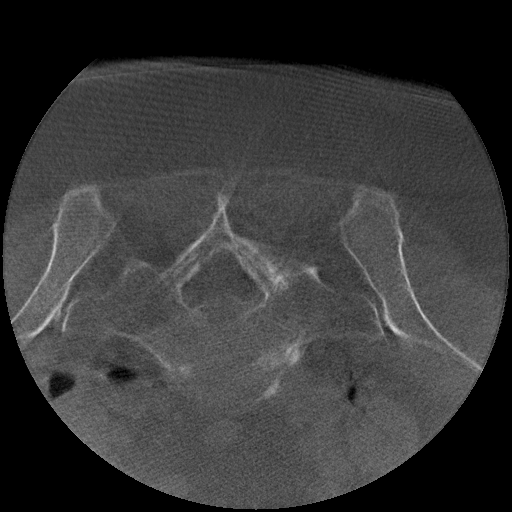
[im 118/170  bone]
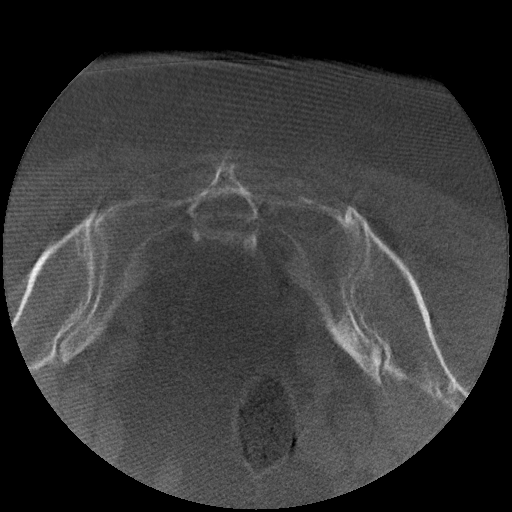
[im 131/170  bone]
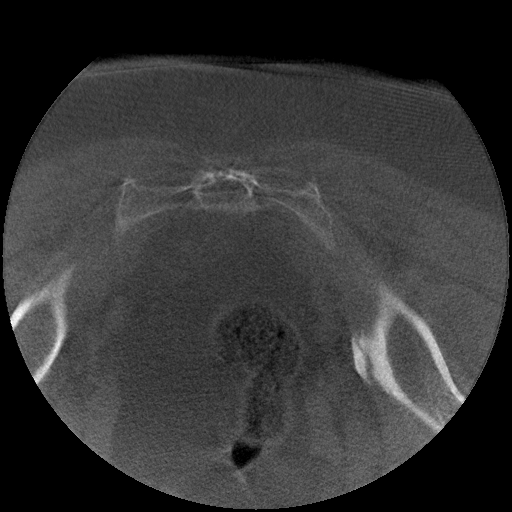
[im 157/170  bone]
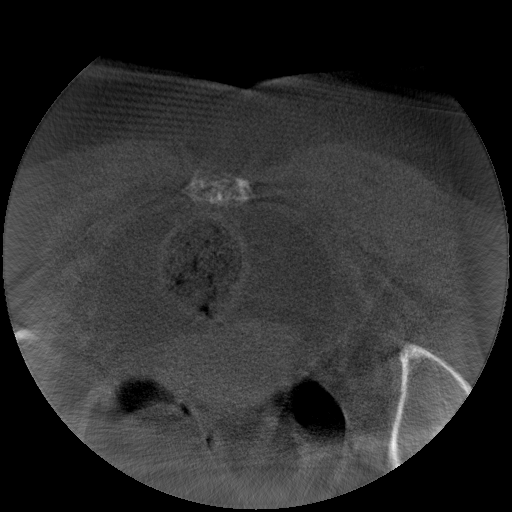

[16 of 33 positions shown; findings below may reference images not displayed]

FINDINGS: Intraoperative portable radiographs obtained in the operating room
show posterior screw and rod fixation of L4-5 with interbody fusion.
Alignment appears anatomic.
IMPRESSION: Status post posterior screw and rod fixation of L4-5 with interbody
fusion.

## 2021-03-01 IMAGING — CT DG C-ARM 1-60 MIN
3 series · 16 of 33 positions shown, 19 images · non-contrast
Comparison: None.

CLINICAL DATA: Back pain, fluoroscopic assistance for lumbar spine
surgery

EXAM:
DG C-ARM 1-60 MIN
FLUOROSCOPY TIME:  Fluoroscopy Time:  28
Radiation Exposure Index (if provided by the fluoroscopic device):
20.4 mGy
Number of Acquired Spot Images: 4

[Series 6: — · axial · 0.36mm/px · z∈[-77,+77]mm · 8 of 170 slices shown, 10 images (1 of 3)]
[im 14/170  soft-tissue]
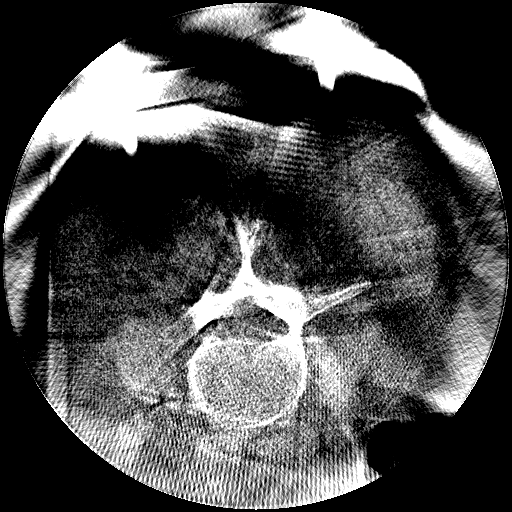
[im 14/170  bone]
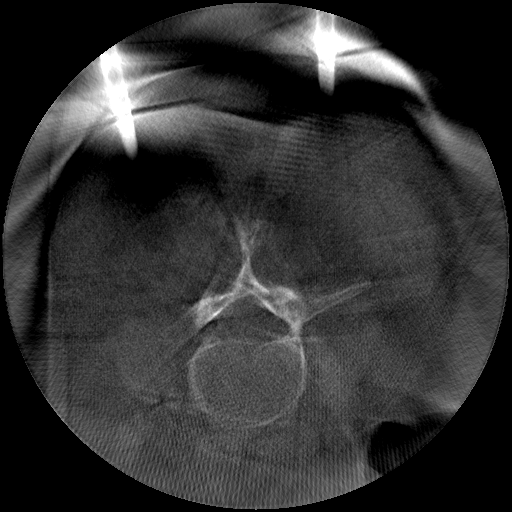
[im 40/170  bone]
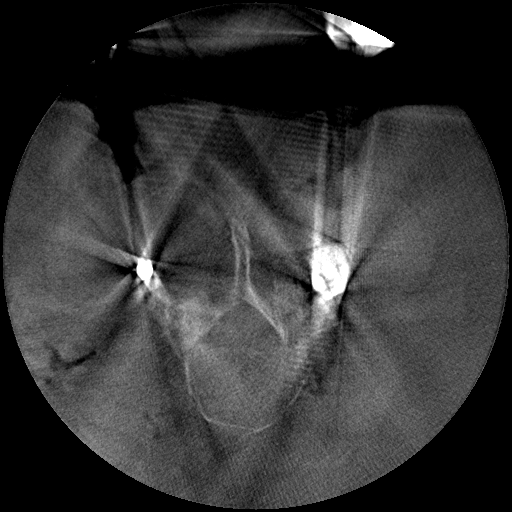
[im 53/170  bone]
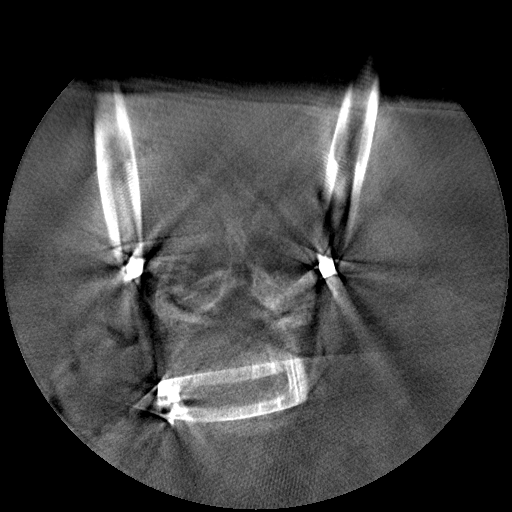
[im 79/170  bone]
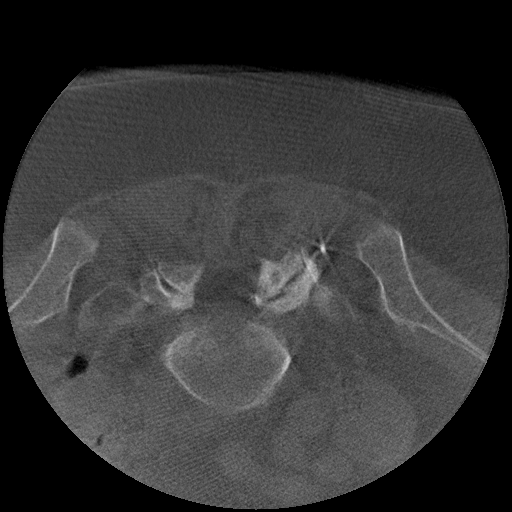
[im 92/170  soft-tissue]
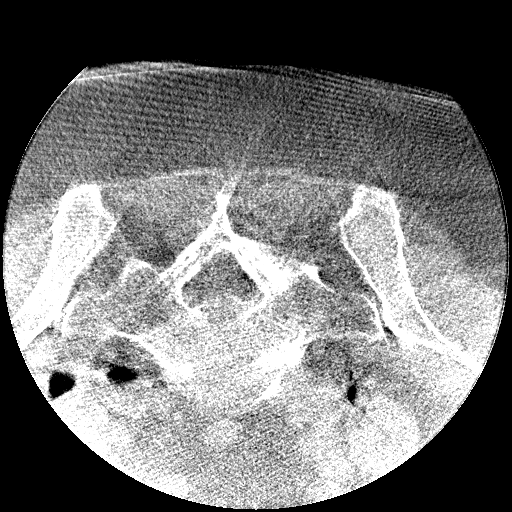
[im 92/170  bone]
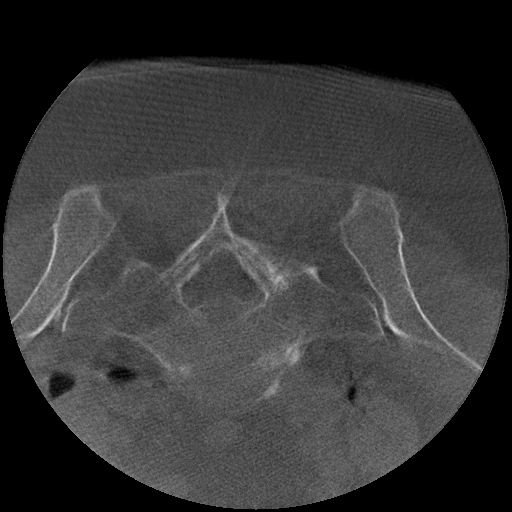
[im 118/170  bone]
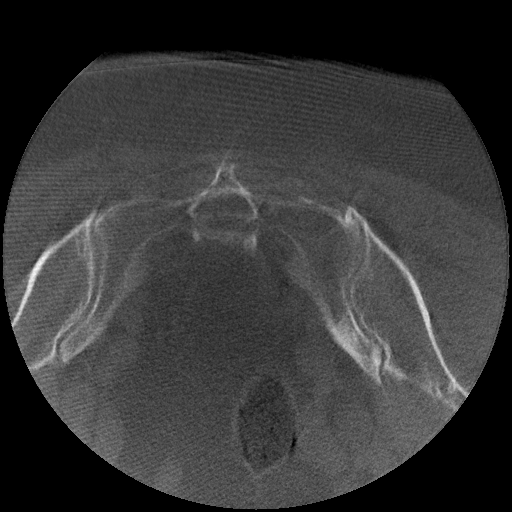
[im 131/170  bone]
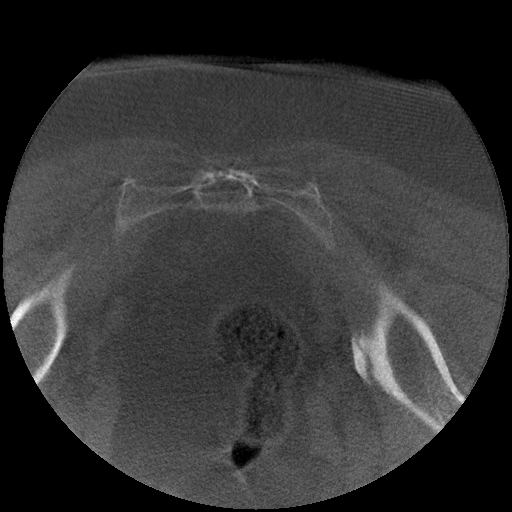
[im 157/170  bone]
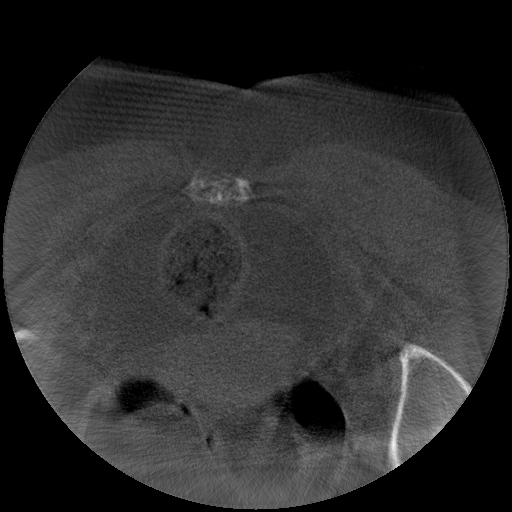

[Series 6: — · sagittal · 0.36mm/px · 5 of 156 slices shown, 6 images (2 of 3)]
[im 52/156  bone]
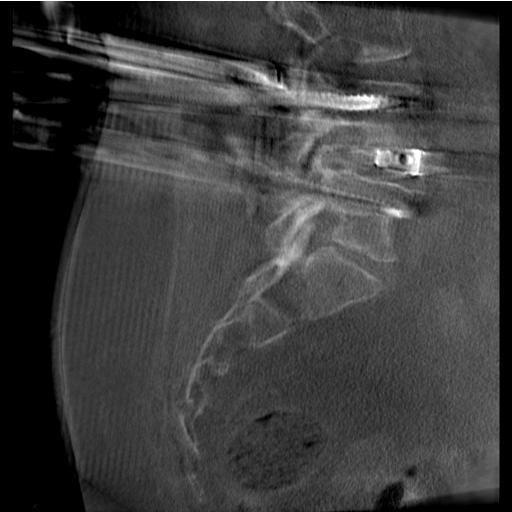
[im 65/156  bone]
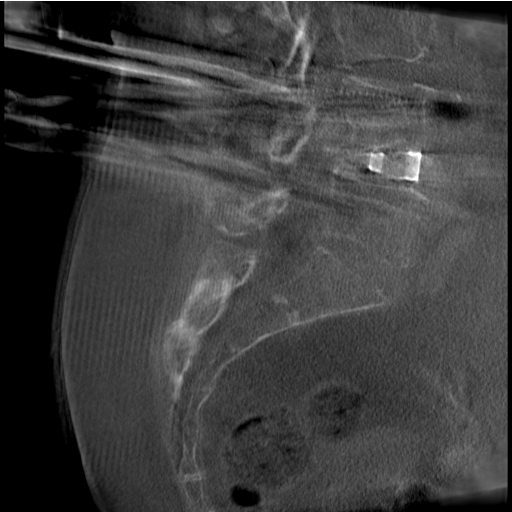
[im 78/156  soft-tissue]
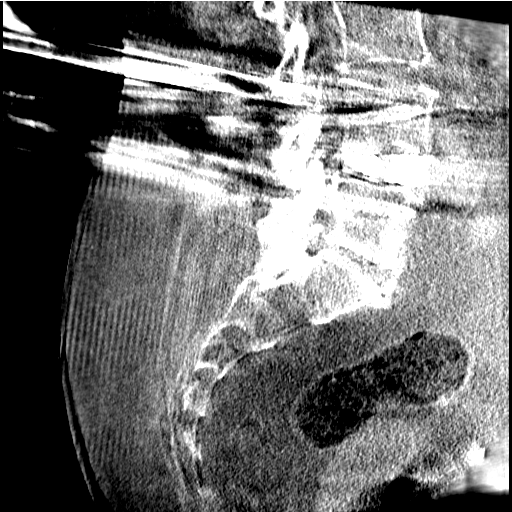
[im 78/156  bone]
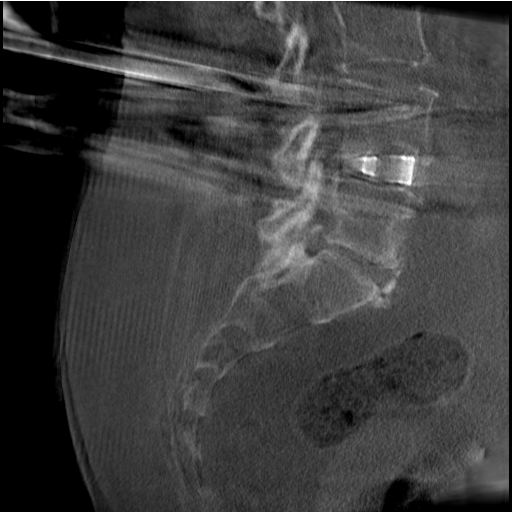
[im 91/156  bone]
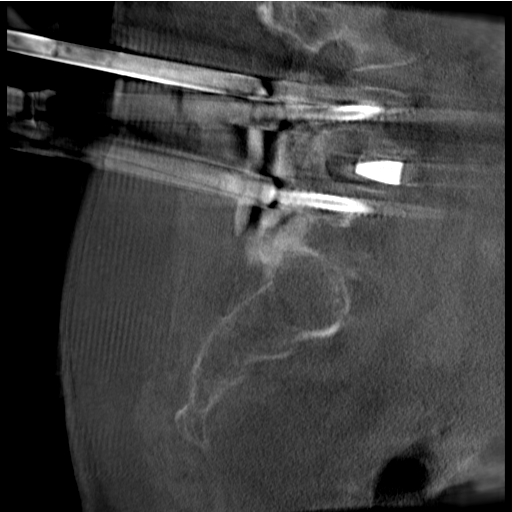
[im 104/156  bone]
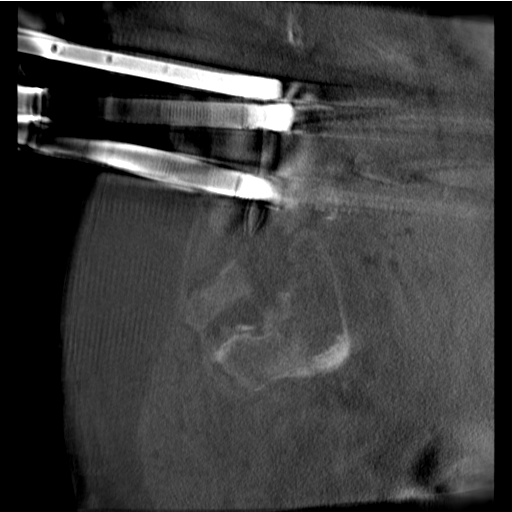

[Series 6: — · coronal · 0.36mm/px · 3 of 122 slices shown (3 of 3)]
[im 25/122  bone]
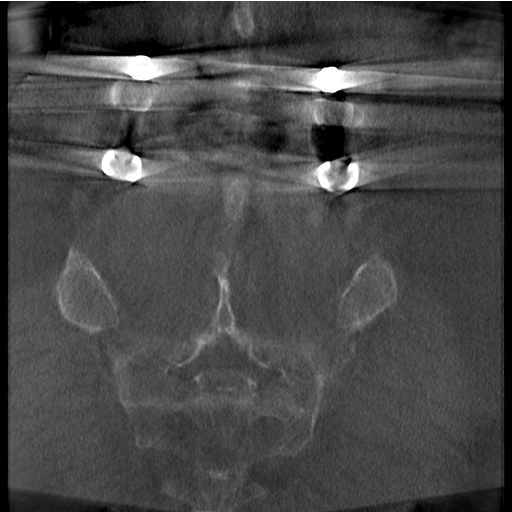
[im 49/122  bone]
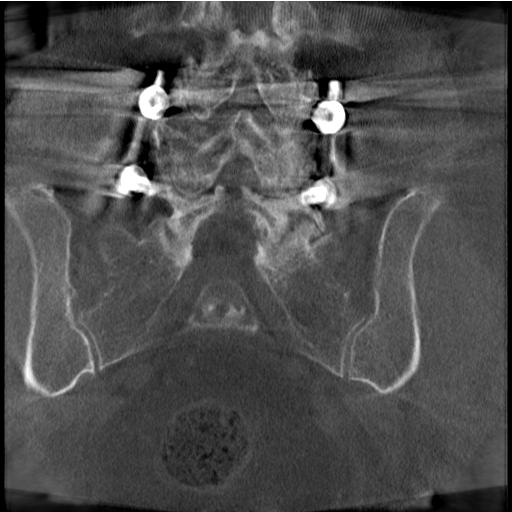
[im 73/122  bone]
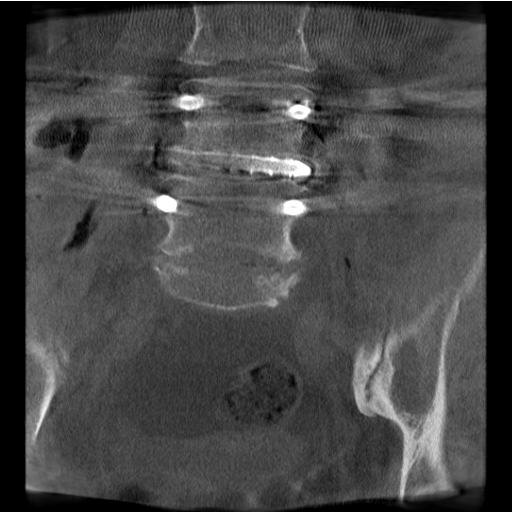

[16 of 33 positions shown; findings below may reference images not displayed]

FINDINGS: Fluoroscopic images show posterior surgical fusion at L4-L5 level.
There is mild anterolisthesis at L4-L5 level.
IMPRESSION: Fluoroscopic assistance was provided for surgical fusion at L4-L5
level.

## 2021-03-01 SURGERY — ANTERIOR LATERAL LUMBAR FUSION WITH PERCUTANEOUS SCREW 1 LEVEL
Anesthesia: General

## 2021-03-01 MED ORDER — FENTANYL CITRATE (PF) 100 MCG/2ML IJ SOLN
INTRAMUSCULAR | Status: AC
  Administered 2021-03-01: 50 ug via INTRAVENOUS
  Filled 2021-03-01: qty 2

## 2021-03-01 MED ORDER — DIPHENHYDRAMINE HCL 50 MG/ML IJ SOLN
12.5000 mg | Freq: Once | INTRAMUSCULAR | Status: AC
  Administered 2021-03-01: 12.5 mg via INTRAVENOUS

## 2021-03-01 MED ORDER — ACETAMINOPHEN 500 MG PO TABS
ORAL_TABLET | ORAL | Status: AC
  Administered 2021-03-01: 1000 mg via ORAL
  Filled 2021-03-01: qty 2

## 2021-03-01 MED ORDER — BISACODYL 10 MG RE SUPP
10.0000 mg | Freq: Every day | RECTAL | Status: DC | PRN
  Filled 2021-03-01: qty 1

## 2021-03-01 MED ORDER — OXYCODONE HCL 5 MG/5ML PO SOLN: 5.0000 mg | Freq: Once | ORAL | Status: DC | PRN

## 2021-03-01 MED ORDER — ONDANSETRON HCL 4 MG/2ML IJ SOLN: 4.0000 mg | Freq: Four times a day (QID) | INTRAMUSCULAR | Status: DC | PRN

## 2021-03-01 MED ORDER — CYCLOSPORINE 0.05 % OP EMUL
1.0000 [drp] | Freq: Two times a day (BID) | OPHTHALMIC | Status: DC
  Administered 2021-03-01 – 2021-03-03 (×4): 1 [drp] via OPHTHALMIC
  Filled 2021-03-01 (×6): qty 30

## 2021-03-01 MED ORDER — PHENYLEPHRINE HCL (PRESSORS) 10 MG/ML IV SOLN
INTRAVENOUS | Status: DC | PRN
  Administered 2021-03-01 (×8): 100 ug via INTRAVENOUS

## 2021-03-01 MED ORDER — LACTATED RINGERS IV SOLN: INTRAVENOUS | Status: DC

## 2021-03-01 MED ORDER — MORPHINE SULFATE (PF) 2 MG/ML IV SOLN: 2.0000 mg | INTRAVENOUS | Status: AC | PRN

## 2021-03-01 MED ORDER — GABAPENTIN 300 MG PO CAPS
300.0000 mg | ORAL_CAPSULE | Freq: Three times a day (TID) | ORAL | Status: DC
  Administered 2021-03-01 – 2021-03-03 (×6): 300 mg via ORAL
  Filled 2021-03-01 (×6): qty 1

## 2021-03-01 MED ORDER — GLYCOPYRROLATE 0.2 MG/ML IJ SOLN
INTRAMUSCULAR | Status: DC | PRN
  Administered 2021-03-01: .2 mg via INTRAVENOUS

## 2021-03-01 MED ORDER — SODIUM CHLORIDE FLUSH 0.9 % IV SOLN
INTRAVENOUS | Status: AC
  Administered 2021-03-01: 3 mL via INTRAVENOUS
  Filled 2021-03-01: qty 10

## 2021-03-01 MED ORDER — SODIUM CHLORIDE 0.9% FLUSH: 3.0000 mL | INTRAVENOUS | Status: DC | PRN

## 2021-03-01 MED ORDER — DOCUSATE SODIUM 100 MG PO CAPS
100.0000 mg | ORAL_CAPSULE | Freq: Two times a day (BID) | ORAL | Status: DC
  Administered 2021-03-01 – 2021-03-03 (×4): 100 mg via ORAL
  Filled 2021-03-01 (×5): qty 1

## 2021-03-01 MED ORDER — FENTANYL CITRATE (PF) 100 MCG/2ML IJ SOLN: 25.0000 ug | INTRAMUSCULAR | Status: DC | PRN

## 2021-03-01 MED ORDER — SODIUM CHLORIDE 0.9 % IV SOLN: 250.0000 mL | INTRAVENOUS | Status: DC

## 2021-03-01 MED ORDER — FAMOTIDINE 20 MG PO TABS
ORAL_TABLET | ORAL | Status: AC
  Administered 2021-03-01: 20 mg via ORAL
  Filled 2021-03-01: qty 1

## 2021-03-01 MED ORDER — PHENOL 1.4 % MT LIQD
1.0000 | OROMUCOSAL | Status: DC | PRN
  Filled 2021-03-01: qty 177

## 2021-03-01 MED ORDER — REMIFENTANIL HCL 1 MG IV SOLR
INTRAVENOUS | Status: AC
  Filled 2021-03-01: qty 1000

## 2021-03-01 MED ORDER — CEFAZOLIN SODIUM-DEXTROSE 2-4 GM/100ML-% IV SOLN
INTRAVENOUS | Status: AC
  Filled 2021-03-01: qty 100

## 2021-03-01 MED ORDER — CHLORHEXIDINE GLUCONATE 0.12 % MT SOLN
OROMUCOSAL | Status: AC
  Administered 2021-03-01: 15 mL via OROMUCOSAL
  Filled 2021-03-01: qty 15

## 2021-03-01 MED ORDER — MIDAZOLAM HCL 2 MG/2ML IJ SOLN
INTRAMUSCULAR | Status: DC | PRN
  Administered 2021-03-01: 2 mg via INTRAVENOUS

## 2021-03-01 MED ORDER — METHOCARBAMOL 1000 MG/10ML IJ SOLN
500.0000 mg | Freq: Four times a day (QID) | INTRAVENOUS | Status: DC | PRN
  Administered 2021-03-01: 500 mg via INTRAVENOUS
  Filled 2021-03-01: qty 5

## 2021-03-01 MED ORDER — ASCORBIC ACID 500 MG PO TABS
1000.0000 mg | ORAL_TABLET | Freq: Every day | ORAL | Status: DC
  Administered 2021-03-02 – 2021-03-03 (×2): 1000 mg via ORAL
  Filled 2021-03-01 (×2): qty 2

## 2021-03-01 MED ORDER — HYDROMORPHONE HCL 1 MG/ML IJ SOLN
INTRAMUSCULAR | Status: AC
  Filled 2021-03-01: qty 1

## 2021-03-01 MED ORDER — FENTANYL CITRATE (PF) 100 MCG/2ML IJ SOLN
INTRAMUSCULAR | Status: DC | PRN
  Administered 2021-03-01 (×2): 50 ug via INTRAVENOUS

## 2021-03-01 MED ORDER — PROPOFOL 10 MG/ML IV BOLUS
INTRAVENOUS | Status: DC | PRN
  Administered 2021-03-01: 130 mg via INTRAVENOUS

## 2021-03-01 MED ORDER — ONDANSETRON HCL 4 MG/2ML IJ SOLN: 4.0000 mg | Freq: Once | INTRAMUSCULAR | Status: DC | PRN

## 2021-03-01 MED ORDER — HYDROMORPHONE HCL 1 MG/ML IJ SOLN: 0.5000 mg | Freq: Once | INTRAMUSCULAR | Status: AC

## 2021-03-01 MED ORDER — FENTANYL BOLUS VIA INFUSION
50.0000 ug | Freq: Once | INTRAVENOUS | Status: DC
  Filled 2021-03-01: qty 50

## 2021-03-01 MED ORDER — LIDOCAINE HCL (CARDIAC) PF 100 MG/5ML IV SOSY
PREFILLED_SYRINGE | INTRAVENOUS | Status: DC | PRN
  Administered 2021-03-01: 80 mg via INTRAVENOUS

## 2021-03-01 MED ORDER — SENNOSIDES-DOCUSATE SODIUM 8.6-50 MG PO TABS
1.0000 | ORAL_TABLET | Freq: Every evening | ORAL | Status: DC | PRN
  Filled 2021-03-01: qty 1

## 2021-03-01 MED ORDER — MEPERIDINE HCL 25 MG/ML IJ SOLN
6.2500 mg | Freq: Once | INTRAMUSCULAR | Status: AC
  Administered 2021-03-01: 6.25 mg via INTRAVENOUS

## 2021-03-01 MED ORDER — ACETAMINOPHEN 10 MG/ML IV SOLN
INTRAVENOUS | Status: DC | PRN
  Administered 2021-03-01: 1000 mg via INTRAVENOUS

## 2021-03-01 MED ORDER — VITAMIN D3 25 MCG (1000 UNIT) PO TABS
5000.0000 [IU] | ORAL_TABLET | Freq: Every day | ORAL | Status: DC
  Administered 2021-03-02 – 2021-03-03 (×2): 5000 [IU] via ORAL
  Filled 2021-03-01 (×3): qty 5

## 2021-03-01 MED ORDER — SODIUM CHLORIDE (PF) 0.9 % IJ SOLN
INTRAMUSCULAR | Status: DC | PRN
  Administered 2021-03-01: 60 mL

## 2021-03-01 MED ORDER — GABAPENTIN 300 MG PO CAPS: 300.0000 mg | ORAL_CAPSULE | Freq: Every day | ORAL | Status: DC | PRN

## 2021-03-01 MED ORDER — ACETAMINOPHEN 10 MG/ML IV SOLN: 1000.0000 mg | Freq: Once | INTRAVENOUS | Status: DC | PRN

## 2021-03-01 MED ORDER — OXYCODONE HCL 5 MG PO TABS
ORAL_TABLET | ORAL | Status: AC
  Administered 2021-03-01: 10 mg via ORAL
  Filled 2021-03-01: qty 2

## 2021-03-01 MED ORDER — LORAZEPAM 2 MG/ML IJ SOLN
INTRAMUSCULAR | Status: AC
  Filled 2021-03-01: qty 1

## 2021-03-01 MED ORDER — ONDANSETRON HCL 4 MG PO TABS: 4.0000 mg | ORAL_TABLET | Freq: Four times a day (QID) | ORAL | Status: DC | PRN

## 2021-03-01 MED ORDER — MIDAZOLAM HCL 2 MG/2ML IJ SOLN
INTRAMUSCULAR | Status: AC
  Filled 2021-03-01: qty 2

## 2021-03-01 MED ORDER — 0.9 % SODIUM CHLORIDE (POUR BTL) OPTIME
TOPICAL | Status: DC | PRN
  Administered 2021-03-01: 1000 mL

## 2021-03-01 MED ORDER — MAGNESIUM OXIDE -MG SUPPLEMENT 400 (240 MG) MG PO TABS
400.0000 mg | ORAL_TABLET | Freq: Every day | ORAL | Status: DC
  Administered 2021-03-01 – 2021-03-02 (×2): 400 mg via ORAL
  Filled 2021-03-01 (×3): qty 1

## 2021-03-01 MED ORDER — BUPIVACAINE-EPINEPHRINE 0.5% -1:200000 IJ SOLN
INTRAMUSCULAR | Status: DC | PRN
  Administered 2021-03-01 (×2): 3 mL

## 2021-03-01 MED ORDER — HYDROMORPHONE HCL 1 MG/ML IJ SOLN
0.2500 mg | INTRAMUSCULAR | Status: DC | PRN
  Administered 2021-03-01: 0.5 mg via INTRAVENOUS

## 2021-03-01 MED ORDER — HYDROMORPHONE HCL 1 MG/ML IJ SOLN
INTRAMUSCULAR | Status: AC
  Administered 2021-03-01: 0.5 mg via INTRAVENOUS
  Filled 2021-03-01: qty 1

## 2021-03-01 MED ORDER — OXYCODONE HCL 5 MG PO TABS: 5.0000 mg | ORAL_TABLET | Freq: Once | ORAL | Status: DC | PRN

## 2021-03-01 MED ORDER — SODIUM CHLORIDE 0.9% FLUSH
3.0000 mL | Freq: Two times a day (BID) | INTRAVENOUS | Status: DC
  Administered 2021-03-01 – 2021-03-03 (×4): 3 mL via INTRAVENOUS

## 2021-03-01 MED ORDER — MAGNESIUM 500 MG PO TABS: 500.0000 mg | ORAL_TABLET | Freq: Every day | ORAL | Status: DC

## 2021-03-01 MED ORDER — OXYCODONE HCL 5 MG PO TABS
10.0000 mg | ORAL_TABLET | ORAL | Status: DC | PRN
  Administered 2021-03-01 – 2021-03-03 (×3): 10 mg via ORAL
  Filled 2021-03-01 (×3): qty 2

## 2021-03-01 MED ORDER — DIPHENHYDRAMINE HCL 50 MG/ML IJ SOLN
INTRAMUSCULAR | Status: AC
  Filled 2021-03-01: qty 1

## 2021-03-01 MED ORDER — FLEET ENEMA 7-19 GM/118ML RE ENEM: 1.0000 | ENEMA | Freq: Once | RECTAL | Status: DC | PRN

## 2021-03-01 MED ORDER — CELECOXIB 200 MG PO CAPS: 200.0000 mg | ORAL_CAPSULE | Freq: Every day | ORAL | Status: DC | PRN

## 2021-03-01 MED ORDER — SURGIFLO WITH THROMBIN (HEMOSTATIC MATRIX KIT) OPTIME
TOPICAL | Status: DC | PRN
  Administered 2021-03-01: 1 via TOPICAL

## 2021-03-01 MED ORDER — DEXAMETHASONE SODIUM PHOSPHATE 10 MG/ML IJ SOLN
INTRAMUSCULAR | Status: DC | PRN
Start: 2021-03-01 — End: 2021-03-01
  Administered 2021-03-01: 5 mg via INTRAVENOUS

## 2021-03-01 MED ORDER — ACETAMINOPHEN 500 MG PO TABS
1000.0000 mg | ORAL_TABLET | Freq: Four times a day (QID) | ORAL | Status: AC
  Administered 2021-03-02 (×3): 1000 mg via ORAL
  Filled 2021-03-01 (×3): qty 2

## 2021-03-01 MED ORDER — SODIUM CHLORIDE 0.9 % IV SOLN: INTRAVENOUS | Status: DC

## 2021-03-01 MED ORDER — FENTANYL CITRATE (PF) 100 MCG/2ML IJ SOLN
25.0000 ug | INTRAMUSCULAR | Status: DC | PRN
  Administered 2021-03-01 (×2): 50 ug via INTRAVENOUS

## 2021-03-01 MED ORDER — LOSARTAN POTASSIUM 50 MG PO TABS
50.0000 mg | ORAL_TABLET | Freq: Every day | ORAL | Status: DC
  Administered 2021-03-01 – 2021-03-03 (×3): 50 mg via ORAL
  Filled 2021-03-01 (×3): qty 1

## 2021-03-01 MED ORDER — PROPOFOL 10 MG/ML IV BOLUS
INTRAVENOUS | Status: AC
  Filled 2021-03-01: qty 40

## 2021-03-01 MED ORDER — REMIFENTANIL HCL 1 MG IV SOLR
INTRAVENOUS | Status: DC | PRN
  Administered 2021-03-01: .1 ug/kg/min via INTRAVENOUS

## 2021-03-01 MED ORDER — ACETAMINOPHEN 10 MG/ML IV SOLN
INTRAVENOUS | Status: AC
  Filled 2021-03-01: qty 100

## 2021-03-01 MED ORDER — GABAPENTIN 300 MG PO CAPS
ORAL_CAPSULE | ORAL | Status: AC
  Filled 2021-03-01: qty 1

## 2021-03-01 MED ORDER — HYDROCHLOROTHIAZIDE 25 MG PO TABS
25.0000 mg | ORAL_TABLET | Freq: Every day | ORAL | Status: DC
  Administered 2021-03-01 – 2021-03-03 (×3): 25 mg via ORAL
  Filled 2021-03-01 (×3): qty 1

## 2021-03-01 MED ORDER — MENTHOL 3 MG MT LOZG
1.0000 | LOZENGE | OROMUCOSAL | Status: DC | PRN
  Filled 2021-03-01: qty 9

## 2021-03-01 MED ORDER — SUCCINYLCHOLINE CHLORIDE 200 MG/10ML IV SOSY
PREFILLED_SYRINGE | INTRAVENOUS | Status: DC | PRN
  Administered 2021-03-01: 80 mg via INTRAVENOUS

## 2021-03-01 MED ORDER — FENTANYL CITRATE (PF) 100 MCG/2ML IJ SOLN
INTRAMUSCULAR | Status: AC
  Filled 2021-03-01: qty 2

## 2021-03-01 MED ORDER — METHOCARBAMOL 500 MG PO TABS
500.0000 mg | ORAL_TABLET | Freq: Four times a day (QID) | ORAL | Status: DC | PRN
  Administered 2021-03-01 – 2021-03-02 (×2): 500 mg via ORAL
  Filled 2021-03-01 (×2): qty 1

## 2021-03-01 MED ORDER — LORAZEPAM 2 MG/ML IJ SOLN
0.5000 mg | Freq: Once | INTRAMUSCULAR | Status: AC
  Administered 2021-03-01: 0.5 mg via INTRAVENOUS

## 2021-03-01 MED ORDER — MEPERIDINE HCL 25 MG/ML IJ SOLN
INTRAMUSCULAR | Status: AC
  Filled 2021-03-01: qty 1

## 2021-03-01 MED ORDER — ONDANSETRON HCL 4 MG/2ML IJ SOLN
INTRAMUSCULAR | Status: DC | PRN
  Administered 2021-03-01: 4 mg via INTRAVENOUS

## 2021-03-01 MED ORDER — OXYCODONE HCL 5 MG PO TABS
5.0000 mg | ORAL_TABLET | ORAL | Status: DC | PRN
  Administered 2021-03-02 – 2021-03-03 (×5): 5 mg via ORAL
  Filled 2021-03-01 (×4): qty 1

## 2021-03-01 SURGICAL SUPPLY — 91 items
BUR NEURO DRILL SOFT 3.0X3.8M (BURR) IMPLANT
CAP LOCKING SPINE (Cap) ×8 IMPLANT
CHLORAPREP W/TINT 26 (MISCELLANEOUS) ×8 IMPLANT
CNTNR SPEC 2.5X3XGRAD LEK (MISCELLANEOUS) ×1
CONT SPEC 4OZ STER OR WHT (MISCELLANEOUS) ×1
CONTAINER SPEC 2.5X3XGRAD LEK (MISCELLANEOUS) ×1 IMPLANT
CORD BIP STRL DISP 12FT (MISCELLANEOUS) ×4 IMPLANT
CORD LIGHT LATERIAL X LIFT (MISCELLANEOUS) ×2 IMPLANT
COUNTER NEEDLE 20/40 LG (NEEDLE) ×4 IMPLANT
COVER BACK TABLE REUSABLE LG (DRAPES) ×2 IMPLANT
CUP MEDICINE 2OZ PLAST GRAD ST (MISCELLANEOUS) ×2 IMPLANT
DERMABOND ADVANCED (GAUZE/BANDAGES/DRESSINGS) ×2
DERMABOND ADVANCED .7 DNX12 (GAUZE/BANDAGES/DRESSINGS) ×2 IMPLANT
DRAPE 3D C-ARM OEC (DRAPES) IMPLANT
DRAPE C ARM PK CFD 31 SPINE (DRAPES) ×4 IMPLANT
DRAPE C-ARMOR (DRAPES) ×4 IMPLANT
DRAPE INCISE IOBAN 66X45 STRL (DRAPES) ×4 IMPLANT
DRAPE LAPAROTOMY 100X77 ABD (DRAPES) ×4 IMPLANT
DRAPE MICROSCOPE SPINE 48X150 (DRAPES) IMPLANT
DRAPE SCAN PATIENT (DRAPES) IMPLANT
DRAPE SHEET LG 3/4 BI-LAMINATE (DRAPES) ×2 IMPLANT
DRAPE SURG 17X11 SM STRL (DRAPES) ×4 IMPLANT
DRSG OPSITE POSTOP 3X4 (GAUZE/BANDAGES/DRESSINGS) ×4 IMPLANT
DRSG OPSITE POSTOP 4X6 (GAUZE/BANDAGES/DRESSINGS) IMPLANT
DRSG OPSITE POSTOP 4X8 (GAUZE/BANDAGES/DRESSINGS) ×2 IMPLANT
DRSG TEGADERM 2-3/8X2-3/4 SM (GAUZE/BANDAGES/DRESSINGS) IMPLANT
DRSG TEGADERM 4X4.75 (GAUZE/BANDAGES/DRESSINGS) IMPLANT
DRSG TEGADERM 6X8 (GAUZE/BANDAGES/DRESSINGS) IMPLANT
DRSG TELFA 3X8 NADH (GAUZE/BANDAGES/DRESSINGS) IMPLANT
DRSG TELFA 4X3 1S NADH ST (GAUZE/BANDAGES/DRESSINGS) IMPLANT
ELECT CAUTERY BLADE TIP 2.5 (TIP) ×2
ELECT EZSTD 165MM 6.5IN (MISCELLANEOUS) ×2
ELECT REM PT RETURN 9FT ADLT (ELECTROSURGICAL) ×4
ELECTRODE CAUTERY BLDE TIP 2.5 (TIP) ×1 IMPLANT
ELECTRODE EZSTD 165MM 6.5IN (MISCELLANEOUS) ×1 IMPLANT
ELECTRODE REM PT RTRN 9FT ADLT (ELECTROSURGICAL) ×2 IMPLANT
FEE INTRAOP CADWELL SUPPLY NCS (MISCELLANEOUS) ×1 IMPLANT
FEE INTRAOP MONITOR IMPULS NCS (MISCELLANEOUS) ×1 IMPLANT
FORCEPS BPLR BAYO 10IN 1.0TIP (ORTHOPEDIC DISPOSABLE SUPPLIES) ×2 IMPLANT
GAUZE 4X4 16PLY ~~LOC~~+RFID DBL (SPONGE) ×2 IMPLANT
GLOVE SURG SYN 6.5 ES PF (GLOVE) ×4 IMPLANT
GLOVE SURG SYN 8.5  E (GLOVE) ×6
GLOVE SURG SYN 8.5 E (GLOVE) ×6 IMPLANT
GLOVE SURG UNDER POLY LF SZ6.5 (GLOVE) ×4 IMPLANT
GOWN SRG LRG LVL 4 IMPRV REINF (GOWNS) ×1 IMPLANT
GOWN SRG XL LVL 3 NONREINFORCE (GOWNS) ×2 IMPLANT
GOWN STRL NON-REIN TWL XL LVL3 (GOWNS) ×2
GOWN STRL REIN LRG LVL4 (GOWNS) ×1
GRADUATE 1200CC STRL 31836 (MISCELLANEOUS) ×2 IMPLANT
HOLDER FOLEY CATH W/STRAP (MISCELLANEOUS) ×2 IMPLANT
INTRAOP CADWELL SUPPLY FEE NCS (MISCELLANEOUS) ×1
INTRAOP DISP SUPPLY FEE NCS (MISCELLANEOUS) ×1
INTRAOP MONITOR FEE IMPULS NCS (MISCELLANEOUS) ×1
INTRAOP MONITOR FEE IMPULSE (MISCELLANEOUS) ×1
K-WIRE 1.6 NITINOL SHARP TIP (WIRE) ×8
KIT DISP MARS 3V (KITS) ×2 IMPLANT
KIT PEDICLE ACCESS (KITS) ×2 IMPLANT
KIT SPINAL PRONEVIEW (KITS) ×2 IMPLANT
KIT TURNOVER KIT A (KITS) ×2 IMPLANT
KNIFE BAYONET SHORT DISCETOMY (MISCELLANEOUS) IMPLANT
KWIRE 1.6 NITINOL SHARP TIP (WIRE) ×4 IMPLANT
MANIFOLD NEPTUNE II (INSTRUMENTS) ×4 IMPLANT
MARKER SKIN DUAL TIP RULER LAB (MISCELLANEOUS) ×6 IMPLANT
NDL SAFETY ECLIPSE 18X1.5 (NEEDLE) ×1 IMPLANT
NEEDLE HYPO 18GX1.5 SHARP (NEEDLE) ×1
NEEDLE HYPO 22GX1.5 SAFETY (NEEDLE) ×2 IMPLANT
NEEDLE I PASS (NEEDLE) ×2 IMPLANT
PACK LAMINECTOMY NEURO (CUSTOM PROCEDURE TRAY) ×2 IMPLANT
PAD ARMBOARD 7.5X6 YLW CONV (MISCELLANEOUS) ×4 IMPLANT
PENCIL ELECTRO HAND CTR (MISCELLANEOUS) ×2 IMPLANT
PUTTY DBX 5CC (Putty) ×2 IMPLANT
ROD SPINE CURVE 5.5X55 (Rod) ×4 IMPLANT
SCREW CANN SPINE 6.5X45 (Screw) ×4 IMPLANT
SCREW CREO 6.5X45 FEN (Screw) ×4 IMPLANT
SCREW CREO MIS 30 TULIP (Screw) ×8 IMPLANT
SLEEVE PROTECTION STRL DISP (MISCELLANEOUS) ×2 IMPLANT
SPACER HEDRON-L 15D 11X18X50 (Spacer) ×2 IMPLANT
SPONGE GAUZE 2X2 8PLY STRL LF (GAUZE/BANDAGES/DRESSINGS) IMPLANT
STAPLER SKIN PROX 35W (STAPLE) IMPLANT
SURGIFLO W/THROMBIN 8M KIT (HEMOSTASIS) ×2 IMPLANT
SUT DVC VLOC 3-0 CL 6 P-12 (SUTURE) ×6 IMPLANT
SUT ETHILON 3-0 FS-10 30 BLK (SUTURE)
SUT VIC AB 0 CT1 27 (SUTURE) ×2
SUT VIC AB 0 CT1 27XCR 8 STRN (SUTURE) ×2 IMPLANT
SUT VIC AB 2-0 CT1 18 (SUTURE) ×6 IMPLANT
SUTURE EHLN 3-0 FS-10 30 BLK (SUTURE) IMPLANT
SYR 30ML LL (SYRINGE) ×4 IMPLANT
TOWEL OR 17X26 4PK STRL BLUE (TOWEL DISPOSABLE) ×6 IMPLANT
TRAY FOLEY MTR SLVR 16FR STAT (SET/KITS/TRAYS/PACK) ×2 IMPLANT
TUBING CONNECTING 10 (TUBING) ×6 IMPLANT
WATER STERILE IRR 500ML POUR (IV SOLUTION) ×2 IMPLANT

## 2021-03-01 NOTE — Op Note (Signed)
Indications: Ms. 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  She failed conservative management prompting intervention.  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   Preoperative Note:   Risks of surgery discussed include: infection, bleeding, stroke, coma, death, paralysis, CSF leak, nerve/spinal cord injury, numbness, tingling, weakness, complex regional pain syndrome, recurrent stenosis and/or disc herniation, vascular injury, development of instability, neck/back pain, need for further surgery, persistent symptoms, development of deformity, and the risks of anesthesia. The patient understood these risks and agreed to proceed.  NAME OF ANTERIOR PROCEDURE:               1. Anterior lumbar interbody fusion via a left lateral retroperitoneal approach at L4/5 2. Placement of a Lordotic Globus Hedron L interbody cage, filled with Demineralized Bone Matrix      NAME OF POSTERIOR PROCEDURE: 1. Posterior instrumentation using Globus Creo 2. Posterolateral fusion, L4-5      PROCEDURE:  Patient was brought to the operating room, intubated, turned to the lateral position.  All pressure points were checked and double-checked.  The patient was prepped and draped in the standard fashion. Prior to prepping, fluoroscopy was brought in and the patient was positioned with a large bump under the contralateral side between the iliac crest and rib cage, allowing the area between the iliac crest and the lateral aspect of the rib cage to open and increase the ability to reach inferiorly, to facilitate entry into the disc space.  The  incision was marked upon the skin both the location of the disc space as well as the superior most aspect of the iliac crest.  Based on the identification of the disc space an incision was prepared, marked upon the skin and eventually was used for our lateral incision.  The fluoroscopy was turned into a cross table A/P image in order to confirm that the patient's spine remained in a perpendicular trajectory to the floor without rotation.  Once confirming that all the pressure points were checked and double-checked and the patient remained in sturdy position strapped down in this slightly jack-knifed lateral position, the patient was prepped and draped in standard fashion.  The skin was injected with local anesthetic, then incised until the abdominal wall fascia was noted.  I bluntly dissected posteriorly until we were able to identify the posterior musculature near petit's triangle.  At this point, using primarily blunt dissection with our finger aided with a metzenbaum scissor, were able to enter the retroperitoneal cavity.  The retroperitoneal potential space was opened further until palpating out the psoas muscle, the medial aspect of the iliac crest, the medial aspect of the last rib and continued to define the retroperitoneal space with blunt dissection in order to facilitate safe placement of our dilators.    While protecting by dissecting directly onto a finger in the retroperitoneum, the retroperitoneal space was entered safely from the lateral incision and the initial dilator placed onto the muscle belly of the psoas.  While directly stimulating the dilator and after radiographically confirming our location relative to the disc space, I placed the dilator through the psoas.  The dilators were stimulated to ensure remaining safely away from any of  the lumbar plexus nerves; the dilators were repositioned until no pathologic stimulation was appreciated.  Once I had confirmed the location of our initial  dilator radiographically, a K-wire secured the dilator into the L4/5 disc space and confirmed position under A/P and lateral fluoroscopy.  At this point, I dilated up with direct stimulation to confirm lack of pathologic stimulation.  Once all the dilators were in position, I placed in the retractor and secured it onto the table, locked into position and confirmed under A/P and lateral fluoroscopy to confirm our approach angle to the disc space as well as location relative to the disc space.  I then placed the muscle stimulator in through the working channel down to the vertebral body, stimulating the entire lateral surface of the vertebral body and any of the visualized psoas muscle that was adjacent to the retractor, confirming again the safe passage to the psoas before we began performing the discectomy.  At this point, we began our discectomy at L4/5.  The disc was incised laterally throughout the extent of our exposure. Using a combination of pituitary rongeurs, Kerrison rongeurs, rasps, curettes of various sorts, we were able to begin to clean out the disc space.  Once we had cleaned out the majority of the disc space, we then cut the lateral annulus with a cob, breaking the lateral annual attachments on the contralateral side by subtly working the cob through the annulus while using flouroscopy.  Care was taken not to extend further than required after cutting the annular attachments.  After this had been performed, we prepared the endplates for placement of our graft, sized a graft to the disc space by serially dilating up in trial sizes until we confirmed that our graft would be well positioned, allowing distraction while maintaining good grip.  This was confirmed under A/P and lateral fluoroscopy in order to ensure its placement as an eventual trial for placement of our final graft.  We irrigated with bacteriostatic saline.  Once confirmed placement, the Globus Hedron implant filled with allograft was  impacted into position at L4/5.   Through a combination of intradiscal distraction and anterior releasing, we were able to correct the anterior deformity during disc preparation and placement of the graft.          At this point, final radiographs were performed, and we began closure.  The wound was closed using 0 Vicryl interrupted suture in the fascia and 2-0 Vicryl inverted suture were placed in the subcutaneous tissue and dermis. 3-0 monocryl was used for final closure. Dermabond was used to close the skin.    After closing the anterior part in layers, the patient was repositioned into prone position.  All pressure points were checked and double-checked and we brought in fluoroscopy to confirm our approach angles for putting in percutaneous pedicle screws.  The pedicles were marked using true AP flouroscopy, adjusting the angle at each level.  We then prepped and draped the patient in the standard fashion.  At this point, incisions were made for placing percutaneous pedicle screw instrumentation at L4-5.  Starting at L4, a Jamsheedi needle was used to cannulate the pedicle bilaterally using AP flouroscopy. Direct stimulation was used on the needle. After cannulation of the pedicle to 30 mm, a K-wire was placed through the Safford approximately and secured.   Using a similar technique, the pedicles at L4-L5 were cannulated and K wires secured. The K wires were then checked using lateral flouroscopy to ensure placement into the vertebral  bodies. After confirming placement of K wires, cannulated pedicle screws were introduced over the K wires at each level.  After advancing each screw into the vertebral body approximately 25-30 mm, the K wire was removed.At each level, 6.5x32mm Globus Creo pedicle screws were placed under lateral flouroscopy. Once the screws were placed, the screw extensions were then linked, a path was formed for the rod and a rod was utilized to connect the screws.  We then  compressed, torqued / counter-torqued and removed the screw assembly. Once performed on each side, confirmatory AP and lateral x-rays were taken and the case was completed.   Some EMG activity was noted in the left gastrocnemius.  We did a CT scan to confirm safe screw placement, which was confirmatory.  The posterior elements were prepared for arthrodesis at L4-5.    Again we confirmed radiographically and began our closure.  The wound was closed using 0 Vicryl interrupted suture in the fascia, 2-0 Vicryl inverted suture were placed in the subcutaneous tissue and dermis. 3-0 monocryl was used for final closure. Dermabond was used to close the skin.    Needle, lap and all counts were correct at the end of the case.    There was no pathologic change in the neuromonitoring during the procedure.   Manning Charity PA assisted in the entire procedure.  Venetia Night MD Neurosurgery

## 2021-03-01 NOTE — H&P (Signed)
I have reviewed and confirmed my history and physical from 02/02/21 with no additions or changes. Plan for L4-5 XLIF/PSF.  Risks and benefits reviewed.  Heart sounds normal no MRG. Chest Clear to Auscultation Bilaterally.

## 2021-03-01 NOTE — Anesthesia Preprocedure Evaluation (Addendum)
Anesthesia Evaluation  Patient identified by MRN, date of birth, ID band Patient awake    Reviewed: Allergy & Precautions, NPO status , Patient's Chart, lab work & pertinent test results  History of Anesthesia Complications (+) history of anesthetic complications (pt reports "low respirations" after shoulder surgery)  Airway Mallampati: III  TM Distance: >3 FB Neck ROM: Full    Dental  (+) Poor Dentition,    Pulmonary asthma , sleep apnea (does not have CPAP, did not tolerate) ,    breath sounds clear to auscultation- rhonchi (-) wheezing      Cardiovascular Exercise Tolerance: Poor METS: 3 - Mets hypertension, Pt. on medications (-) CAD, (-) Past MI, (-) Cardiac Stents and (-) CABG  Rhythm:Regular Rate:Normal - Systolic murmurs and - Diastolic murmurs    Neuro/Psych neg Seizures PSYCHIATRIC DISORDERS Anxiety Anterolisthesis Chronic bilateral low back pain with left-sided sciatica  Neuromuscular disease    GI/Hepatic Neg liver ROS, GERD  ,  Endo/Other  negative endocrine ROSneg diabetes  Renal/GU negative Renal ROS     Musculoskeletal  (+) Arthritis ,   Abdominal (+) + obese,   Peds  Hematology  (+) anemia ,   Anesthesia Other Findings Past Medical History: No date: Anemia     Comment:  with pregnancy No date: Anxiety No date: Arthritis     Comment:  osteo - hips, knees, lower back No date: Asthma     Comment:  well controlled No date: Complication of anesthesia     Comment:  Respirations dropped after shoulder surgery. spent the               night No date: DDD (degenerative disc disease), lumbar     Comment:  L4-L5 No date: GERD (gastroesophageal reflux disease) No date: Hypertension No date: Motion sickness     Comment:  [employer] theaters No date: Sleep apnea     Comment:  no CPAP/ not tolerated   Reproductive/Obstetrics                            Anesthesia  Physical  Anesthesia Plan  ASA: II  Anesthesia Plan: General   Post-op Pain Management:    Induction:   PONV Risk Score and Plan: 2 and Ondansetron, TIVA and Dexamethasone  Airway Management Planned: Oral ETT  Additional Equipment:   Intra-op Plan:   Post-operative Plan: Extubation in OR  Informed Consent: I have reviewed the patients History and Physical, chart, labs and discussed the procedure including the risks, benefits and alternatives for the proposed anesthesia with the patient or authorized representative who has indicated his/her understanding and acceptance.     Dental advisory given  Plan Discussed with: CRNA and Anesthesiologist  Anesthesia Plan Comments:        Anesthesia Quick Evaluation

## 2021-03-01 NOTE — Transfer of Care (Addendum)
Immediate Anesthesia Transfer of Care Note  Patient: Sheila Rivas  Procedure(s) Performed: L4-5 LATERAL INTERBODY FUSION, L4-5 POSTERIOR FUSION  Patient Location: PACU  Anesthesia Type:General  Level of Consciousness: awake, alert  and oriented  Airway & Oxygen Therapy: Patient Spontanous Breathing  Post-op Assessment: Report given to RN and Post -op Vital signs reviewed and stable  Post vital signs: Reviewed and stable  Last Vitals:  Vitals Value Taken Time  BP    Temp    Pulse    Resp    SpO2      Last Pain:  Vitals:   03/01/21 1212  TempSrc: Temporal  PainSc: 0-No pain         Complications: No notable events documented.

## 2021-03-01 NOTE — Anesthesia Procedure Notes (Signed)
Procedure Name: Intubation Date/Time: 03/01/2021 1:45 PM Performed by: Chanetta Marshall, CRNA Pre-anesthesia Checklist: Patient identified, Emergency Drugs available, Suction available and Patient being monitored Patient Re-evaluated:Patient Re-evaluated prior to induction Oxygen Delivery Method: Circle system utilized Preoxygenation: Pre-oxygenation with 100% oxygen Induction Type: IV induction Ventilation: Mask ventilation without difficulty Laryngoscope Size: Mac and 4 Grade View: Grade II Tube type: Oral Tube size: 7.0 mm Number of attempts: 1 Airway Equipment and Method: Stylet and Video-laryngoscopy Placement Confirmation: ETT inserted through vocal cords under direct vision, positive ETCO2, breath sounds checked- equal and bilateral and CO2 detector Secured at: 21 cm Tube secured with: Tape Dental Injury: Teeth and Oropharynx as per pre-operative assessment

## 2021-03-02 ENCOUNTER — Encounter: Payer: Self-pay | Admitting: Neurosurgery

## 2021-03-02 MED ORDER — DIPHENHYDRAMINE HCL 25 MG PO CAPS
25.0000 mg | ORAL_CAPSULE | Freq: Four times a day (QID) | ORAL | Status: DC | PRN
  Administered 2021-03-02: 25 mg via ORAL
  Filled 2021-03-02: qty 1

## 2021-03-02 MED ORDER — KETOROLAC TROMETHAMINE 30 MG/ML IJ SOLN
15.0000 mg | Freq: Three times a day (TID) | INTRAMUSCULAR | Status: DC
  Administered 2021-03-02 – 2021-03-03 (×5): 15 mg via INTRAVENOUS
  Filled 2021-03-02 (×5): qty 1

## 2021-03-02 MED ORDER — ENOXAPARIN SODIUM 40 MG/0.4ML IJ SOSY
40.0000 mg | PREFILLED_SYRINGE | INTRAMUSCULAR | Status: DC
  Administered 2021-03-02 – 2021-03-03 (×2): 40 mg via SUBCUTANEOUS
  Filled 2021-03-02 (×2): qty 0.4

## 2021-03-02 NOTE — Progress Notes (Signed)
Physical Therapy Treatment Patient Details Name: Sheila Rivas MRN: [id number] DOB: 10-16-1950 Today's Date: 03/02/2021   History of Present Illness Sheila Rivas is a 70yoF who comes [facility] on 11[facility] for L4-5 XLIF and PSF c Dr. Venetia Night. 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 Per orders "no brace needed". PMH: Rt rotator cuff surgery x2 with chronic limitattions. Rt Posterior THA April 2022.    PT Comments    Author returning at end of day (eval in AM) to attempt AMB once again, now that adequate pain control is being achieved. Pt moving much better, less guarded, in all mobility. AMB advanced from 32ft to >150ft. Plan on stairs training tomorrow in prep for DC.    Recommendations for follow up therapy are one component of a multi-disciplinary discharge planning process, led by the attending physician.  Recommendations may be updated based on patient status, additional functional criteria and insurance authorization.  Follow Up Recommendations  Home health PT     Assistance Recommended at Discharge Intermittent Supervision/Assistance  Equipment Recommendations  None recommended by PT    Recommendations for Other Services       Precautions / Restrictions Precautions Precautions: Fall Restrictions Weight Bearing Restrictions: No     Mobility  Bed Mobility Overal bed mobility: Needs Assistance Bed Mobility: Supine to Sit;Sit to Supine     Supine to sit: Min assist Sit to supine: Min assist        Transfers Overall transfer level: Needs assistance Equipment used: Rolling walker (2 wheels) Transfers: Sit to/from Stand Sit to Stand: Modified independent (Device/Increase time)           General transfer comment: moving much better now with pain control    Ambulation/Gait Ambulation/Gait assistance: Supervision;Min guard Gait Distance (Feet): 140 Feet Assistive device: Rolling walker (2 wheels) Gait Pattern/deviations: WFL(Within  Functional Limits) Gait velocity: 0.72m/s     General Gait Details: moving much better; reportedly getting tired of legs at halfway point, hence turned around   [employer]             Wheelchair Mobility    Modified Rankin (Stroke Patients Only)       Balance Overall balance assessment: Modified Independent                                          Cognition Arousal/Alertness: Awake/alert Behavior During Therapy: WFL for tasks assessed/performed Overall Cognitive Status: Within Functional Limits for tasks assessed                                          Exercises      General Comments        Pertinent Vitals/Pain Pain Assessment: No/denies pain Pain Score: 2  Pain Location: left low back pain    Home Living                          Prior Function            PT Goals (current goals can now be found in the care plan section) Acute Rehab PT Goals Patient Stated Goal: return to home with help from family PT Goal Formulation: With patient Time For Goal Achievement: 03/16/21 Potential to Achieve Goals: Good Progress towards  PT goals: Progressing toward goals    Frequency    7X/week      PT Plan Current plan remains appropriate    Co-evaluation              AM-PAC PT "6 Clicks" Mobility   Outcome Measure  Help needed turning from your back to your side while in a flat bed without using bedrails?: A Lot Help needed moving from lying on your back to sitting on the side of a flat bed without using bedrails?: A Lot Help needed moving to and from a bed to a chair (including a wheelchair)?: A Little Help needed standing up from a chair using your arms (e.g., wheelchair or bedside chair)?: A Little Help needed to walk in hospital room?: A Little Help needed climbing 3-5 steps with a railing? : A Lot 6 Click Score: 15    End of Session Equipment Utilized During Treatment: Gait belt Activity Tolerance:  Patient tolerated treatment well;Patient limited by fatigue Patient left: in bed;with SCD's reapplied;with call bell/phone within reach Nurse Communication: Mobility status PT Visit Diagnosis: Other abnormalities of gait and mobility (R26.89);Difficulty in walking, not elsewhere classified (R26.2)     Time: 7121-9758 PT Time Calculation (min) (ACUTE ONLY): 18 min  Charges:  $Gait Training: 8-22 mins           5:04 PM, 03/02/21 Rosamaria Lints, PT, DPT Physical Therapist - [facility]  [phone] (ASCOM)     Stefany Starace C 03/02/2021, 5:02 PM

## 2021-03-02 NOTE — Progress Notes (Signed)
    Attending Progress Note  History: Sheila Rivas is s/p L4-5 XLIF and PSF  POD1: pt reports some left hip soreness and back soreness.  She denies any additional acute events overnight.  Physical Exam: Vitals:   03/02/21 0438 03/02/21 0747  BP: (!) 125/57 (!) 133/53  Pulse: 79 77  Resp: 16 16  Temp: 98 F (36.7 C) 98.2 F (36.8 C)  SpO2: 95% 96%    AA Ox3 CNI  Strength:3/5 left HF and KE, 5/5 distal LLE. 4-/5 right HF and KE 5/5 distally.  Incisions c/d/I with post-op bandages in place  Data:  No results for input(s): NA, K, CL, CO2, BUN, CREATININE, LABGLOM, GLUCOSE, CALCIUM in the last 168 hours. No results for input(s): AST, ALT, ALKPHOS in the last 168 hours.  Invalid input(s): TBILI   No results for input(s): WBC, HGB, HCT, PLT in the last 168 hours. No results for input(s): APTT, INR in the last 168 hours.       Other tests/results: none  Assessment/Plan:  Sheila Rivas is a 70 year old is s/p L4-5 XLIF and PSF on 03/01/21  - mobilize - pain control - added toradol  - DVT prophylaxis - PTOT  Manning Charity PA-C Department of Neurosurgery

## 2021-03-02 NOTE — TOC Initial Note (Signed)
Transition of Care [facility]) - Initial/Assessment Note    Patient Details  Name: Sheila Rivas MRN: [id number] Date of Birth: Dec 26, 1950  Transition of [facility]) CM/SW Contact:    Caryn Section, RN Phone Number: 03/02/2021, 4:23 PM  Clinical Narrative:   Patient lives at home with adult children, who can assist if needed, as they can transport her to appointments and the pharmacy.  Patient can take medications as directed without concerns.  Patient has no concerns about returning home with home health.   Patient would like [facility], however as per [place], they are 14 days out for PT.  Patient agrees to other agencies.  Will inquire and notify patient.  TOC contact information provided, tOC to follow                Expected Discharge Plan: Home w Home Health Services Barriers to Discharge: Continued Medical Work up   Patient Goals and CMS Choice        Expected Discharge Plan and Services Expected Discharge Plan: Home w Home Health Services   Discharge Planning Services: CM Consult   Living arrangements for the past 2 months: Single Family Home                 DME Arranged:  (does not require DME)                    Prior Living Arrangements/Services Living arrangements for the past 2 months: Single Family Home Lives with:: Self, Relatives, Adult Children Patient language and need for interpreter reviewed:: Yes (Interpreter not required) Do you feel safe going back to the place where you live?: Yes      Need for Family Participation in Patient Care: Yes (Comment) Care giver support system in place?: Yes (comment) Current home services: Home OT, Home PT (patient has walker, shower chair and BSC) Criminal Activity/Legal Involvement Pertinent to Current Situation/Hospitalization: No - Comment as needed  Activities of Daily Living Home Assistive Devices/Equipment: Hearing aid, Eyeglasses, Walker (specify type), Bedside commode/3-in-1 ADL Screening  (condition at time of admission) Patient's cognitive ability adequate to safely complete daily activities?: Yes Is the patient deaf or have difficulty hearing?: Yes Does the patient have difficulty seeing, even when wearing glasses/contacts?: No Does the patient have difficulty concentrating, remembering, or making decisions?: No Patient able to express need for assistance with ADLs?: Yes Does the patient have difficulty dressing or bathing?: No Independently performs ADLs?: Yes (appropriate for developmental age) Does the patient have difficulty walking or climbing stairs?: Yes Weakness of Legs: None Weakness of Arms/Hands: None  Permission Sought/Granted Permission sought to share information with : Case Manager, Family Supports       Permission granted to share info w AGENCY: [facility] agencies        Emotional Assessment Appearance:: Appears stated age Attitude/Demeanor/Rapport: Engaged Affect (typically observed): Pleasant, Appropriate Orientation: : Oriented to Self, Oriented to Place, Oriented to  Time, Oriented to Situation Alcohol / Substance Use: Not Applicable Psych Involvement: No (comment)  Admission diagnosis:  S/P lumbar fusion [Z98.1] Patient Active Problem List   Diagnosis Date Noted   S/P lumbar fusion 03/01/2021   H/O total hip arthroplasty 08/01/2020   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   Hypertension 01/19/2020   Mild intermittent asthma 01/19/2020   Obesity (BMI 30-39.9) 01/19/2020   Chronic bilateral low back pain with left-sided sciatica 07/27/2019   Foraminal stenosis of lumbar region 07/27/2019   Osteoarthritis of  hips, bilateral 10/21/2017   DDD (degenerative disc disease), lumbar 04/23/2017   PCP:  Myrene Buddy, NP Pharmacy:   [facility] [address], [place] - [address] [place] [address] Phone: [phone] Fax: [phone]     Social Determinants of Health (SDOH)  Interventions    Readmission Risk Interventions No flowsheet data found.

## 2021-03-02 NOTE — Plan of Care (Signed)
  Problem: Health Behavior/Discharge Planning: Goal: Ability to manage health-related needs will improve Outcome: Progressing   Problem: Pain Managment: Goal: General experience of comfort will improve Outcome: Progressing   Problem: Safety: Goal: Ability to remain free from injury will improve Outcome: Progressing   

## 2021-03-02 NOTE — Evaluation (Signed)
Physical Therapy Evaluation Patient Details Name: Sheila Rivas MRN: [id number] DOB: 1951-03-06 Today's Date: 03/02/2021  History of Present Illness  Sheila Rivas is a 70yoF who comes to [facility] on 11/9 for L4-5 XLIF and PSF c Dr. Venetia Night. 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 Per orders "no brace needed". PMH: Rt rotator cuff surgery x2 with chronic limitattions. Rt Posterior THA April 2022.  Clinical Impression  Pt admitted with above diagnosis. Pt currently with functional limitations due to the deficits listed below (see "PT Problem List"). Upon entry, pt in bed, awake and agreeable to participate. The pt is alert, pleasant, interactive, and able to provide info regarding prior level of function, both in tolerance and independence. Pt educated on BAT precautions and discussed log-roll education, however pt has chronic shoulder pathology that will preclude independent log roll at DC, caregiver assistance will be needed. Pt requiring modA for bed mobility, near-max effort for transfers, has orthostatic dizziness which is flat, non-progressive. Pt able to tolerated sustained standing for 2-3 minutes with RW. Pt able to AMB ~60ft in room, but will need pain meds prior to tolerating greater distances or performing stairs training. Patient's performance this date reveals decreased ability, independence, and tolerance in performing all basic mobility required for performance of activities of daily living. Pt requires additional DME, close physical assistance, and cues for safe participate in mobility. Pt will benefit from skilled PT intervention to increase independence and safety with basic mobility in preparation for discharge to the venue listed below.        Recommendations for follow up therapy are one component of a multi-disciplinary discharge planning process, led by the attending physician.  Recommendations may be updated based on patient status, additional functional  criteria and insurance authorization.  Follow Up Recommendations Home health PT    Assistance Recommended at Discharge Intermittent Supervision/Assistance  Functional Status Assessment Patient has had a recent decline in their functional status and demonstrates the ability to make significant improvements in function in a reasonable and predictable amount of time.  Equipment Recommendations  None recommended by PT    Recommendations for Other Services       Precautions / Restrictions        Mobility  Bed Mobility Overal bed mobility: Needs Assistance Bed Mobility: Supine to Sit     Supine to sit: Mod assist          Transfers Overall transfer level: Needs assistance Equipment used: Rolling walker (2 wheels) Transfers: Sit to/from Stand Sit to Stand: Supervision           General transfer comment: heavy effort, absolute need for hands, cues for push off chair rather than RW.    Ambulation/Gait Ambulation/Gait assistance: Supervision Gait Distance (Feet): 36 Feet Assistive device: Rolling walker (2 wheels) Gait Pattern/deviations: WFL(Within Functional Limits);Step-to pattern;Antalgic       General Gait Details: very pain limited (still waiting on pain meds), but is safe and steady. Author drives IV pole.  Stairs            Wheelchair Mobility    Modified Rankin (Stroke Patients Only)       Balance                                             Pertinent Vitals/Pain Pain Assessment: 0-10 Pain Score: 8  Pain Location: left low  back pain Pain Intervention(s): Limited activity within patient's tolerance;Monitored during session;Premedicated before session    Home Living Family/patient expects to be discharged to:: Private residence Living Arrangements: Children (DTR, SIL, 2 grandDTre (8 and 4yo)) Available Help at Discharge: Family;Available 24 hours/day Type of Home: House Home Access: Stairs to enter Entrance Stairs-Rails:  None Entrance Stairs-Number of Steps: 3   Home Layout: One level Home Equipment: Agricultural consultant (2 wheels);BSC/3in1;Toilet riser;Cane - single point Additional Comments: reacher; her bed    Prior Function Prior Level of Function : Independent/Modified Independent             Mobility Comments: Limited community distances, frequent short walks to get steps in and take dog out. Prior intermitten tuse of SPC. ADLs Comments: independent with ADL     Hand Dominance   Dominant Hand: Right    Extremity/Trunk Assessment        Lower Extremity Assessment Lower Extremity Assessment: Overall WFL for tasks assessed (PTA had RLE paresthesia, LLE pain)       Communication      Cognition                                                General Comments      Exercises Other Exercises Other Exercises: education on log roll tecnique and likely need for caregvier assist due to chronic shoulder DJD Other Exercises: education on needs for stairs training prior to DC Other Exercises: education on need to discuss technique for car transfers   Assessment/Plan    PT Assessment Patient needs continued PT services  PT Problem List Decreased strength;Decreased activity tolerance;Decreased balance;Decreased mobility       PT Treatment Interventions DME instruction;Balance training;Gait training;Stair training;Functional mobility training;Therapeutic activities;Therapeutic exercise;Patient/family education    PT Goals (Current goals can be found in the Care Plan section)  Acute Rehab PT Goals Patient Stated Goal: return to home with help from family PT Goal Formulation: With patient Time For Goal Achievement: 03/16/21 Potential to Achieve Goals: Good    Frequency 7X/week   Barriers to discharge        Co-evaluation               AM-PAC PT "6 Clicks" Mobility  Outcome Measure Help needed turning from your back to your side while in a flat bed without  using bedrails?: A Lot Help needed moving from lying on your back to sitting on the side of a flat bed without using bedrails?: A Lot Help needed moving to and from a bed to a chair (including a wheelchair)?: A Little Help needed standing up from a chair using your arms (e.g., wheelchair or bedside chair)?: A Little Help needed to walk in hospital room?: A Little Help needed climbing 3-5 steps with a railing? : A Lot 6 Click Score: 15    End of Session   Activity Tolerance: Patient tolerated treatment well;Patient limited by pain Patient left: in chair;with nursing/sitter in room;with call bell/phone within reach;with SCD's reapplied Nurse Communication: Mobility status PT Visit Diagnosis: Other abnormalities of gait and mobility (R26.89);Difficulty in walking, not elsewhere classified (R26.2)    Time: 9562-1308 PT Time Calculation (min) (ACUTE ONLY): 43 min   Charges:   PT Evaluation $PT Eval Moderate Complexity: 1 Mod PT Treatments $Therapeutic Activity: 8-22 mins       10:38 AM, 03/02/21 Isaias Cowman  Belva Agee, PT, DPT Physical Therapist - [facility]  [phone] (ASCOM)    Emanie Behan C 03/02/2021, 10:35 AM

## 2021-03-02 NOTE — Progress Notes (Addendum)
Pt is complaining of itching on hands and belly but no PRN order. MD Myer Haff made aware. Will continue to monitor.  Update 0505: MD Myer Haff placed order for benadryl 25 mg capsule every 6 hours PRN for itching. Will continue to monitor.  Update [phone]: Foley was removed at 0657.Pt was advised to pee within 6 hour period. Will notify incoming shift. Will continue to monitor.

## 2021-03-02 NOTE — Evaluation (Signed)
Occupational Therapy Evaluation Patient Details Name: Sheila Rivas MRN: [id number] DOB: Aug 09, 1950 Today's Date: 03/02/2021   History of Present Illness Sheila Rivas is a 70yoF who comes to [facility] on 11/9 for L4-5 XLIF and PSF c Dr. Meade Maw. 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 Per orders "no brace needed". PMH: Rt rotator cuff surgery x2 with chronic limitattions. Rt Posterior THA April 2022.   Clinical Impression   Pt seen for OT evaluation this date in setting of acute hospital stay s/p L4-5 XLIF and PSF. Pt reports being INDEP at baseline, but gradually becoming more limited 2/2 pain. She presents this date with decreased activity tolerance, post-op pain, and ROM limitations both 2/2 pain and precautions post-op'ly. OT engages pt in education re: precautions and how it could impact LB ADLs. On assessment this date, Pt requires SETUP for seated UB ADLs and MOD A for seated LB ADLs d/t limited LE ROM. Pt able to CTS with RW with cues for safe sequencing of use and CGA with progress to SUPV. She demos good carryover and understanding of correct walker use. OT engages pt in commode transfer with SUPV and peri care with modified technique to adhere to precautions. Pt demos good understanding. Pt back to bed with MIN A at end of session. Left with all needs met and in reach. Will continue to follow acutely. Anticipate pt wil require HHOT f/u services to ensure safety with ADLs and offer modifications as it applies to the home environment and IADLs as tolerable.     Recommendations for follow up therapy are one component of a multi-disciplinary discharge planning process, led by the attending physician.  Recommendations may be updated based on patient status, additional functional criteria and insurance authorization.   Follow Up Recommendations  Home health OT    Assistance Recommended at Discharge Intermittent Supervision/Assistance  Functional Status Assessment   Patient has had a recent decline in their functional status and demonstrates the ability to make significant improvements in function in a reasonable and predictable amount of time.  Equipment Recommendations  BSC/3in1;Tub/shower seat;Other (comment) (2ww)    Recommendations for Other Services       Precautions / Restrictions Precautions Precautions: Fall Restrictions Weight Bearing Restrictions: No      Mobility Bed Mobility Overal bed mobility: Needs Assistance Bed Mobility: Sidelying to Sit;Sit to Sidelying   Sidelying to sit: Min assist Supine to sit: Min assist Sit to supine: Min assist Sit to sidelying: Min assist General bed mobility comments: cues for logroll, using LEs to propel d/t some chronic shoulder issues making pulling with bed rails diffiuclt to tolerate for pt.    Transfers Overall transfer level: Needs assistance Equipment used: Rolling walker (2 wheels) Transfers: Sit to/from Stand Sit to Stand: Min guard;Supervision           General transfer comment: cues for safe use of RW, demos good tolerance, no sway, CGA with progress to SUPV as pt perfects RW use.      Balance Overall balance assessment: Needs assistance   Sitting balance-Leahy Scale: Normal       Standing balance-Leahy Scale: Good Standing balance comment: benefits from UE support for fxl mobility, able to alterante hands from walker for static standing tasks.                           ADL either performed or assessed with clinical judgement   ADL  General ADL Comments: requires SETUP for seated UB ADLs, MOD A for seated LB ADLs d/t limited by precations, but also limited d/t weakness in lower extremities with L side weakness greater than L, pt attributing to incisioin being more toward L side and causing increased pain     Vision Baseline Vision/History: 1 Wears glasses Ability to See in Adequate Light: 0  Adequate Patient Visual Report: No change from baseline       Perception     Praxis      Pertinent Vitals/Pain Pain Assessment: 0-10 Pain Score: 4  Pain Location: left low back pain Pain Descriptors / Indicators: Tender Pain Intervention(s): Limited activity within patient's tolerance;Monitored during session;Premedicated before session     Hand Dominance Right   Extremity/Trunk Assessment Upper Extremity Assessment Upper Extremity Assessment: Overall WFL for tasks assessed;Generalized weakness (ROM WFL, h/o shoulder issues per pt, shld MMT not taken, grip MMT grossly 4-/5)   Lower Extremity Assessment Lower Extremity Assessment: RLE deficits/detail;LLE deficits/detail;Defer to PT evaluation RLE Deficits / Details: able to elevate against gravity, but limited ability to tolerate light resistance LLE Deficits / Details: notably weaker than R, pt reports this is d/t pain being more present on L side. Noted decreased ability to elevate against gravity at bed level       Communication Communication Communication: No difficulties   Cognition Arousal/Alertness: Awake/alert Behavior During Therapy: WFL for tasks assessed/performed Overall Cognitive Status: Within Functional Limits for tasks assessed                                       General Comments       Exercises Other Exercises Other Exercises: OT engages pt in ed re: role of OT, LB ADL modifications, safe use of RW. Pt with good understanding and carryover.   Shoulder Instructions      Home Living Family/patient expects to be discharged to:: Private residence Living Arrangements: Children (DTR, SIL, 2 grandDTre (8 and 4yo)) Available Help at Discharge: Family;Available 24 hours/day Type of Home: House Home Access: Stairs to enter [employer] of Steps: 3 Entrance Stairs-Rails: None Home Layout: One level     Bathroom Shower/Tub: Occupational psychologist: Handicapped  height     Home Equipment: Conservation officer, nature (2 wheels);BSC/3in1;Toilet riser;Cane - single point          Prior Functioning/Environment Prior Level of Function : Independent/Modified Independent             Mobility Comments: Limited community distances, frequent short walks to get steps in and take dog out. Prior intermitten tuse of SPC. ADLs Comments: independent with ADL        OT Problem List: Decreased activity tolerance;Decreased strength;Decreased knowledge of use of DME or AE      OT Treatment/Interventions: Self-care/ADL training;Therapeutic exercise;DME and/or AE instruction;Therapeutic activities;Patient/family education    OT Goals(Current goals can be found in the care plan section) Acute Rehab OT Goals Patient Stated Goal: to go home OT Goal Formulation: With patient Time For Goal Achievement: 03/16/21 Potential to Achieve Goals: Good ADL Goals Pt Will Perform Lower Body Dressing: with supervision;with adaptive equipment;sit to/from stand Pt Will Transfer to Toilet: with min guard assist;ambulating;bedside commode Pt Will Perform Toileting - Clothing Manipulation and hygiene: with supervision;sit to/from stand  OT Frequency: Min 2X/week   Barriers to D/C:            Co-evaluation  AM-PAC OT "6 Clicks" Daily Activity     Outcome Measure Help from another person eating meals?: None Help from another person taking care of personal grooming?: A Little Help from another person toileting, which includes using toliet, bedpan, or urinal?: A Little Help from another person bathing (including washing, rinsing, drying)?: A Little Help from another person to put on and taking off regular upper body clothing?: A Little Help from another person to put on and taking off regular lower body clothing?: A Little 6 Click Score: 19   End of Session Equipment Utilized During Treatment: Gait belt;Rolling walker (2 wheels)  Activity Tolerance: Patient  tolerated treatment well Patient left: in bed;with call bell/phone within reach;with bed alarm set  OT Visit Diagnosis: Unsteadiness on feet (R26.81);Muscle weakness (generalized) (M62.81);Pain Pain - Right/Left: Left Pain - part of body: Leg (and low back)                Time: 3887-1959 OT Time Calculation (min): 41 min Charges:  OT General Charges $OT Visit: 1 Visit OT Evaluation $OT Eval Low Complexity: 1 Low OT Treatments $Self Care/Home Management : 8-22 mins $Therapeutic Activity: 8-22 mins  Gerrianne Scale, MS, OTR/L ascom [phone] 03/02/21, 6:15 PM

## 2021-03-03 MED ORDER — SENNOSIDES-DOCUSATE SODIUM 8.6-50 MG PO TABS: 1.0000 | ORAL_TABLET | Freq: Every evening | ORAL | 0 refills | Status: DC | PRN

## 2021-03-03 MED ORDER — OXYCODONE HCL 5 MG PO TABS: 5.0000 mg | ORAL_TABLET | ORAL | 0 refills | Status: AC | PRN

## 2021-03-03 MED ORDER — METHOCARBAMOL 500 MG PO TABS: 500.0000 mg | ORAL_TABLET | Freq: Three times a day (TID) | ORAL | 0 refills | Status: DC | PRN
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
## 2021-03-03 NOTE — Care Management Important Message (Signed)
Important Message  Patient Details  Name: Karington Zarazua MRN: [id number] Date of Birth: 04/09/1951   Medicare Important Message Given:  N/A - LOS <3 / Initial given by admissions     Olegario Messier A Sofiya Ezelle 03/03/2021, 9:44 AM

## 2021-03-03 NOTE — Progress Notes (Signed)
Occupational Therapy Treatment Patient Details Name: Sheila Rivas MRN: [id number] DOB: January 03, 1951 Today's Date: 03/03/2021   History of present illness Sheila Rivas is a 62yoF who comes to [facility] on 11/9 for L4-5 XLIF and PSF c Dr. Meade Maw. 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 Per orders "no brace needed". PMH: Rt rotator cuff surgery x2 with chronic limitattions. Rt Posterior THA April 2022.   OT comments  Pt seen for OT tx this date. OT engages pt in LB dressing with modified technique with MIN/MOD A. Pt requires SETUP for hand hygiene in prep for meal time. OT assists pt in optimizing positioning in prep for meal time with ed re: using tricep strength to push to propel towards Sheila Rivas rather than attempting to pull, to reduce exacerbating chronic shoulder pain. OT educates pt re: modified core strengthening that pt can do without risking hard to her back such as contracting abdominal muscles with exhalation. Pt with good understanding. OT places pt in chair position in bed to optimize for meal time. She is left with all needs met and in reach. Will continue to follow.   Recommendations for follow up therapy are one component of a multi-disciplinary discharge planning process, led by the attending physician.  Recommendations may be updated based on patient status, additional functional criteria and insurance authorization.    Follow Up Recommendations  Home health OT    Assistance Recommended at Discharge Intermittent Supervision/Assistance  Equipment Recommendations  BSC/3in1;Tub/shower seat;Other (comment) (2ww)    Recommendations for Other Services      Precautions / Restrictions Precautions Precautions: Fall Restrictions Weight Bearing Restrictions: No       Mobility Bed Mobility Overal bed mobility: Needs Assistance             General bed mobility comments: MIN A for reposisioning in bed to propel towards Sheila Rivas and then OT places bed in chair  position in prep for meal time. Pt requries minimal cueing for use of R LE to assist with propulsion as well as tricep strength to push rather than pull towards HOB to reduce exacerbating chronic shoulder pain.    Transfers                         Balance Overall balance assessment: Needs assistance   Sitting balance-Leahy Rivas: Normal       Standing balance-Leahy Rivas: Good                             ADL either performed or assessed with clinical judgement   ADL Overall ADL's : Needs assistance/impaired     Grooming: Wash/dry hands;Set up;Sitting               Lower Body Dressing: Minimal assistance;Moderate assistance;Bed level Lower Body Dressing Details (indicate cue type and reason): figure 4 in bed for socks                    Extremity/Trunk Assessment              Vision       Perception     Praxis      Cognition Arousal/Alertness: Awake/alert Behavior During Therapy: WFL for tasks assessed/performed Overall Cognitive Status: Within Functional Limits for tasks assessed  Exercises Other Exercises Other Exercises: OT engages pt in seated g/h tasks and repositioning to address pain   Shoulder Instructions       General Comments      Pertinent Vitals/ Pain       Pain Assessment: 0-10 Pain Score: 6  Pain Location: left low back pain Pain Descriptors / Indicators: Tender Pain Intervention(s): Limited activity within patient's tolerance;Monitored during session;Premedicated before session  Home Living                                          Prior Functioning/Environment              Frequency  Min 2X/week        Progress Toward Goals  OT Goals(current goals can now be found in the care plan section)  Progress towards OT goals: Progressing toward goals  Acute Rehab OT Goals Patient Stated Goal: to go home OT Goal  Formulation: With patient Time For Goal Achievement: 03/16/21 Potential to Achieve Goals: Good  Plan Discharge plan remains appropriate    Co-evaluation                 AM-PAC OT "6 Clicks" Daily Activity     Outcome Measure   Help from another person eating meals?: None Help from another person taking care of personal grooming?: A Little Help from another person toileting, which includes using toliet, bedpan, or urinal?: A Little Help from another person bathing (including washing, rinsing, drying)?: A Little Help from another person to put on and taking off regular upper body clothing?: None Help from another person to put on and taking off regular lower body clothing?: A Little 6 Click Score: 20    End of Session Equipment Utilized During Treatment: Gait belt  OT Visit Diagnosis: Unsteadiness on feet (R26.81);Muscle weakness (generalized) (M62.81);Pain Pain - Right/Left: Left Pain - part of body: Leg (and low back)   Activity Tolerance Patient tolerated treatment well   Patient Left in bed;with call bell/phone within reach;with bed alarm set   Nurse Communication Mobility status        Time: 2787-1836 OT Time Calculation (min): 13 min  Charges: OT General Charges $OT Visit: 1 Visit OT Treatments $Self Care/Home Management : 8-22 mins  Sheila Scale, MS, OTR/L ascom [phone] 03/03/21, 3:19 PM

## 2021-03-03 NOTE — TOC Transition Note (Signed)
Transition of Care [facility]) - CM/SW Discharge Note   Patient Details  Name: Sheila Rivas MRN: [id number] Date of Birth: 12/12/1950  [facility] CM/SW Contact:  Hetty Ely, RN Phone Number: 03/03/2021, 1:27 PM     Final next level of care: Home w Home Health Services Barriers to Discharge: Barriers Resolved   Patient Goals and CMS Choice        Discharge Placement                Patient to be transferred to facility by: Daughter Erin Name of family member notified: Patient called daughter Patient and family notified of of transfer: 03/03/21  Discharge Plan and Services   Discharge Planning Services: CM Consult            DME Arranged:  (does not require DME)         HH Arranged: PT HH Agency: [facility] Date [facility] Agency Contacted: 03/03/21 Time HH Agency Contacted: 1324 Representative spoke with at [facility] Agency: Cindie  Social Determinants of Health (SDOH) Interventions     Readmission Risk Interventions No flowsheet data found.

## 2021-03-03 NOTE — Progress Notes (Signed)
    Attending Progress Note  History: Sheila Rivas is s/p L4-5 XLIF and PSF  POD2: improved left thigh weakness. Continues to have some soreness but feels her pain is relatively well controlled.  POD1: pt reports some left hip soreness and back soreness.  She denies any additional acute events overnight.  Physical Exam: Vitals:   03/02/21 2331 03/03/21 0431  BP: (!) 114/59 (!) 136/55  Pulse: 70 73  Resp: 17 16  Temp: 97.8 F (36.6 C) 97.9 F (36.6 C)  SpO2: 95% 96%    AA Ox3 CNI  Strength:4-/5 left HF and KE, 5/5 distal LLE. 4+/5 right HF and KE 5/5 distally.  Incisions c/d/I with post-op bandages in place  Data:  No results for input(s): NA, K, CL, CO2, BUN, CREATININE, LABGLOM, GLUCOSE, CALCIUM in the last 168 hours. No results for input(s): AST, ALT, ALKPHOS in the last 168 hours.  Invalid input(s): TBILI   No results for input(s): WBC, HGB, HCT, PLT in the last 168 hours. No results for input(s): APTT, INR in the last 168 hours.       Other tests/results: none  Assessment/Plan:  Sheila Rivas is a 70 year old is s/p L4-5 XLIF and PSF on 03/01/21  - mobilize - pain control - added toradol  - DVT prophylaxis - PTOT; plans to try stair training today - dispo planning  Manning Charity PA-C Department of Neurosurgery

## 2021-03-03 NOTE — Progress Notes (Signed)
Physical Therapy Treatment Patient Details Name: Sheila Rivas MRN: [id number] DOB: [birth year] Today's Date: 03/03/2021   History of Present Illness Sheila Rivas is a 70yoF who comes to [facility] on 11/9 for L4-5 XLIF and PSF c Dr. Venetia Night. 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 Per orders "no brace needed". PMH: Rt rotator cuff surgery x2 with chronic limitattions. Rt Posterior THA April 2022.    PT Comments    Pt in bed upon entry agreeable to session, pt has had her pain meds at this time. ModI bed mobility and transfers with RW, AMB with supervision to training stairs, then 3 different sets of stairs training x3 c rails and then c RW. Pt performed with adequate strength, low impulsivity, albeit she reports feeling nervous when performing. Pt will have assist from DTR for entry stairs at DC. Author offered to provide a handout or video link of stairs training for herself or for DTR, which she declined several times. Pt left in bed at EOS, moving better today than previous.     Recommendations for follow up therapy are one component of a multi-disciplinary discharge planning process, led by the attending physician.  Recommendations may be updated based on patient status, additional functional criteria and insurance authorization.  Follow Up Recommendations  Home health PT     Assistance Recommended at Discharge Intermittent Supervision/Assistance  Equipment Recommendations  None recommended by PT    Recommendations for Other Services       Precautions / Restrictions Precautions Precautions: Fall Restrictions Weight Bearing Restrictions: No     Mobility  Bed Mobility Overal bed mobility: Modified Independent                  Transfers Overall transfer level: Modified independent Equipment used: Rolling walker (2 wheels)                    Ambulation/Gait   Gait Distance (Feet): 80 Feet (emphasis on stairs today, no long distance  AMB) Assistive device: Rolling walker (2 wheels)             Stairs Stairs: Yes     Number of Stairs: 10 General stair comments: 3 with 2 rails FWD; 3 with RW backward up FWD down; rest then repeat of the latter.   Wheelchair Mobility    Modified Rankin (Stroke Patients Only)       Balance                                            Cognition Arousal/Alertness: Awake/alert Behavior During Therapy: WFL for tasks assessed/performed Overall Cognitive Status: Within Functional Limits for tasks assessed                                          Exercises      General Comments        Pertinent Vitals/Pain Pain Assessment: 0-10 Pain Score: 6  Pain Location: left low back pain Pain Intervention(s): Premedicated before session;Limited activity within patient's tolerance;Patient requesting pain meds-RN notified    Home Living                          Prior Function  PT Goals (current goals can now be found in the care plan section) Acute Rehab PT Goals Patient Stated Goal: return to home with help from family PT Goal Formulation: With patient Time For Goal Achievement: 03/16/21 Potential to Achieve Goals: Good Progress towards PT goals: Progressing toward goals    Frequency    7X/week      PT Plan Current plan remains appropriate    Co-evaluation              AM-PAC PT "6 Clicks" Mobility   Outcome Measure  Help needed turning from your back to your side while in a flat bed without using bedrails?: None Help needed moving from lying on your back to sitting on the side of a flat bed without using bedrails?: None Help needed moving to and from a bed to a chair (including a wheelchair)?: None Help needed standing up from a chair using your arms (e.g., wheelchair or bedside chair)?: None Help needed to walk in hospital room?: A Little Help needed climbing 3-5 steps with a railing? : A  Little 6 Click Score: 22    End of Session   Activity Tolerance: Patient tolerated treatment well;No increased pain Patient left: in bed;with SCD's reapplied;with call bell/phone within reach Nurse Communication: Mobility status PT Visit Diagnosis: Other abnormalities of gait and mobility (R26.89);Difficulty in walking, not elsewhere classified (R26.2)     Time: 1005-1020 PT Time Calculation (min) (ACUTE ONLY): 15 min  Charges:  $Gait Training: 8-22 mins            10:45 AM, 03/03/21 Rosamaria Lints, PT, DPT Physical Therapist - [facility]  [phone] (ASCOM)    Roisin Mones C 03/03/2021, 10:42 AM

## 2021-03-03 NOTE — Discharge Summary (Signed)
Physician Discharge Summary  Patient ID: Sheila Rivas MRN: [id number] DOB/AGE: 1951/01/04 70 y.o.  Admit date: 03/01/2021 Discharge date: 03/03/2021  Admission Diagnoses: lumbar spondylolisthesis and radiculoapthy  Discharge Diagnoses:  Active Problems:   S/P lumbar fusion   Discharged Condition: good  Hospital Course: Sheila Rivas is a 70 y.o presenting with back and leg pain. She underwent a L4-5 XLIF and PSF without interoperative complications. She developed mild left proximal leg weakness without numbness on POD1 which improved some on POD2 after Toradol. She was seen and evaluated by PT who cleared her for discharge home with home health. She was discharged home with prescriptions for pain medication and muscle relaxer.   Consults: None  Significant Diagnostic Studies: none  Treatments: surgery: as above. See separately dictated operative report for further details.   Discharge Exam: Blood pressure (!) 148/66, pulse 74, temperature 97.9 F (36.6 C), resp. rate 16, height 5' 1.5" (1.562 m), weight 77.1 kg, SpO2 96 %. CN II-XII grossly intact Strength:4-/5 left HF and KE, 5/5 distal LLE. 4+/5 right HF and KE 5/5 distally.  Incisions c/d/I with post-op bandages in place Incisions c/d/I with dermabond in place   Disposition: Discharge disposition: 06-Home-Health Care Svc      Discharge Instructions     If the dressing is still on your incision site when you go home, remove it on the third day after your surgery date. Remove dressing if it begins to fall off, or if it is dirty or damaged before the third day.   Complete by: As directed    Incentive spirometry RT   Complete by: As directed    Increase activity slowly   Complete by: As directed       Allergies as of 03/03/2021       Reactions   Hydromorphone Itching   Tramadol Itching   Redness, red puffy eyes        Medication List     STOP taking these medications    FISH OIL PO       TAKE  these medications    acetaminophen 500 MG tablet Commonly known as: TYLENOL Take 1,000 mg by mouth every 8 (eight) hours as needed for moderate pain.   ASPERCREME LIDOCAINE EX Apply 1 application topically daily as needed (pain).   CALCIUM CITRATE + D3 PO Take 1 tablet by mouth daily.   celecoxib 200 MG capsule Commonly known as: CELEBREX Take 200 mg by mouth daily as needed for moderate pain.   cycloSPORINE 0.05 % ophthalmic emulsion Commonly known as: RESTASIS Place 1 drop into both eyes 2 (two) times daily.   gabapentin 300 MG capsule Commonly known as: NEURONTIN Take 300 mg by mouth See admin instructions. Take 300 mg 3 times daily, may take a fourth 300 mg dose as needed for pain   Ginkgo Biloba 500 MG Caps Take 500 mg by mouth daily.   hydrochlorothiazide 25 MG tablet Commonly known as: HYDRODIURIL Take 25 mg by mouth daily.   losartan 50 MG tablet Commonly known as: COZAAR Take 50 mg by mouth daily.   Magnesium 500 MG Tabs Take 500 mg by mouth at bedtime.   methocarbamol 500 MG tablet Commonly known as: ROBAXIN Take 1 tablet (500 mg total) by mouth every 8 (eight) hours as needed for muscle spasms.   oxyCODONE 5 MG immediate release tablet Commonly known as: Oxy IR/ROXICODONE Take 1 tablet (5 mg total) by mouth every 4 (four) hours as needed for up to 5 days (pain).  PROBIOTIC PO Take 1 capsule by mouth daily.   senna-docusate 8.6-50 MG tablet Commonly known as: Senokot-S Take 1 tablet by mouth at bedtime as needed for mild constipation.   SUPER B COMPLEX/C PO Take 1 tablet by mouth daily.   Valerian Root 500 MG Caps Take 500 mg by mouth at bedtime.   vitamin C 1000 MG tablet Take 1,000 mg by mouth daily.   Vitamin D3 125 MCG (5000 UT) Caps Take 5,000 Units by mouth daily.   zinc gluconate 50 MG tablet Take 50 mg by mouth daily.               Discharge Care Instructions  (From admission, onward)           Start     Ordered    03/03/21 0000  If the dressing is still on your incision site when you go home, remove it on the third day after your surgery date. Remove dressing if it begins to fall off, or if it is dirty or damaged before the third day.        03/03/21 1303             Signed: Susanne Borders 03/03/2021, 1:04 PM

## 2021-03-03 NOTE — Anesthesia Postprocedure Evaluation (Signed)
Anesthesia Post Note  Patient: Sheila Rivas  Procedure(s) Performed: L4-5 LATERAL INTERBODY FUSION, L4-5 POSTERIOR FUSION  Patient location during evaluation: PACU Anesthesia Type: General Level of consciousness: awake and alert Pain management: pain level controlled Vital Signs Assessment: post-procedure vital signs reviewed and stable Respiratory status: spontaneous breathing, nonlabored ventilation, respiratory function stable and patient connected to nasal cannula oxygen Cardiovascular status: blood pressure returned to baseline and stable Postop Assessment: no apparent nausea or vomiting Anesthetic complications: no   No notable events documented.   Last Vitals:  Vitals:   03/03/21 0814 03/03/21 1129  BP: (!) 144/67 (!) 148/66  Pulse: 74 74  Resp: 15 16  Temp: 36.6 C 36.6 C  SpO2: 94% 96%    Last Pain:  Vitals:   03/03/21 1300  TempSrc:   PainSc: 7                  Lenard Simmer

## 2021-03-03 NOTE — Plan of Care (Signed)
Patient discharged home per MD orders at this time.All discharge instructions,education and medications was reviewed with patient at the bedside.Pt expressed understanding and will comply with dc instructions.follow up appointments was also communicated to the patient.no verbal c/o or any ssx of distress.patient was discharged home with HH/PT services per order.patient was transported home by daughter in a privately owned car.

## 2021-03-29 ENCOUNTER — Other Ambulatory Visit: Payer: Medicare HMO

## 2021-10-02 ENCOUNTER — Ambulatory Visit: Payer: Medicare HMO | Admitting: Anesthesiology

## 2021-10-02 ENCOUNTER — Encounter
Admission: RE | Disposition: A | Discharge status: Home / Self Care | Payer: Self-pay | Source: Home / Self Care | Attending: Gastroenterology

## 2021-10-02 ENCOUNTER — Encounter: Payer: Self-pay | Admitting: *Deleted

## 2021-10-02 ENCOUNTER — Ambulatory Visit
Admission: RE | Admit: 2021-10-02 | Discharge: 2021-10-02 | Disposition: A | Discharge status: Home / Self Care | Payer: Medicare HMO | Attending: Gastroenterology | Admitting: Gastroenterology

## 2021-10-02 DIAGNOSIS — K219 Gastro-esophageal reflux disease without esophagitis: Secondary | ICD-10-CM | POA: Diagnosis not present

## 2021-10-02 DIAGNOSIS — I10 Essential (primary) hypertension: Secondary | ICD-10-CM | POA: Diagnosis not present

## 2021-10-02 DIAGNOSIS — K640 First degree hemorrhoids: Secondary | ICD-10-CM | POA: Diagnosis not present

## 2021-10-02 DIAGNOSIS — M160 Bilateral primary osteoarthritis of hip: Secondary | ICD-10-CM | POA: Insufficient documentation

## 2021-10-02 DIAGNOSIS — Z86010 Personal history of colonic polyps: Secondary | ICD-10-CM | POA: Diagnosis not present

## 2021-10-02 DIAGNOSIS — Z1211 Encounter for screening for malignant neoplasm of colon: Secondary | ICD-10-CM | POA: Insufficient documentation

## 2021-10-02 DIAGNOSIS — G4730 Sleep apnea, unspecified: Secondary | ICD-10-CM | POA: Insufficient documentation

## 2021-10-02 DIAGNOSIS — J45909 Unspecified asthma, uncomplicated: Secondary | ICD-10-CM | POA: Diagnosis not present

## 2021-10-02 HISTORY — PX: COLONOSCOPY WITH PROPOFOL: SHX5780

## 2021-10-02 SURGERY — COLONOSCOPY WITH PROPOFOL
Anesthesia: General

## 2021-10-02 MED ORDER — PROPOFOL 10 MG/ML IV BOLUS
INTRAVENOUS | Status: DC | PRN
  Administered 2021-10-02: 80 mg via INTRAVENOUS
  Administered 2021-10-02 (×2): 50 mg via INTRAVENOUS

## 2021-10-02 MED ORDER — LIDOCAINE HCL (PF) 2 % IJ SOLN
INTRAMUSCULAR | Status: AC
  Filled 2021-10-02: qty 5

## 2021-10-02 MED ORDER — PROPOFOL 1000 MG/100ML IV EMUL
INTRAVENOUS | Status: AC
  Filled 2021-10-02: qty 100

## 2021-10-02 MED ORDER — LIDOCAINE HCL (CARDIAC) PF 100 MG/5ML IV SOSY
PREFILLED_SYRINGE | INTRAVENOUS | Status: DC | PRN
  Administered 2021-10-02: 50 mg via INTRAVENOUS

## 2021-10-02 MED ORDER — PROPOFOL 500 MG/50ML IV EMUL
INTRAVENOUS | Status: DC | PRN
  Administered 2021-10-02: 150 ug/kg/min via INTRAVENOUS

## 2021-10-02 MED ORDER — SODIUM CHLORIDE 0.9 % IV SOLN: INTRAVENOUS | Status: DC

## 2021-10-02 NOTE — Op Note (Signed)
[facility] Gastroenterology Patient Name: Sheila Rivas Procedure Date: 10/02/2021 12:38 PM MRN: [id number] Account #: [id number] Date of Birth: 1950-05-03 Admit Type: Outpatient Age: 71 Room: [facility] ENDO ROOM 3 Gender: Female Note Status: Finalized Instrument Name: Jasper Riling [id number] Procedure:             Colonoscopy Indications:           Surveillance: Personal history of adenomatous polyps                         on last colonoscopy > 5 years ago Providers:             Andrey Farmer MD, MD Referring MD:          Juluis Rainier (Referring MD) Medicines:             Monitored Anesthesia Care Complications:         No immediate complications. Procedure:             Pre-Anesthesia Assessment:                        - Prior to the procedure, a History and Physical was                         performed, and patient medications and allergies were                         reviewed. The patient is competent. The risks and                         benefits of the procedure and the sedation options and                         risks were discussed with the patient. All questions                         were answered and informed consent was obtained.                         Patient identification and proposed procedure were                         verified by the physician, the nurse, the                         anesthesiologist, the anesthetist and the technician                         in the endoscopy suite. Mental Status Examination:                         alert and oriented. Airway Examination: normal                         oropharyngeal airway and neck mobility. Respiratory                         Examination: clear to auscultation. CV Examination:  normal. Prophylactic Antibiotics: The patient does not                         require prophylactic antibiotics. Prior                         Anticoagulants: The patient has taken no  previous                         anticoagulant or antiplatelet agents. ASA Grade                         Assessment: II - A patient with mild systemic disease.                         After reviewing the risks and benefits, the patient                         was deemed in satisfactory condition to undergo the                         procedure. The anesthesia plan was to use monitored                         anesthesia care (MAC). Immediately prior to                         administration of medications, the patient was                         re-assessed for adequacy to receive sedatives. The                         heart rate, respiratory rate, oxygen saturations,                         blood pressure, adequacy of pulmonary ventilation, and                         response to care were monitored throughout the                         procedure. The physical status of the patient was                         re-assessed after the procedure.                        After obtaining informed consent, the colonoscope was                         passed under direct vision. Throughout the procedure,                         the patient's blood pressure, pulse, and oxygen                         saturations were monitored continuously. The  Colonoscope was introduced through the anus and                         advanced to the the cecum, identified by appendiceal                         orifice and ileocecal valve. The colonoscopy was                         performed without difficulty. The patient tolerated                         the procedure well. The quality of the bowel                         preparation was good. Findings:      The perianal and digital rectal examinations were normal.      Internal hemorrhoids were found during retroflexion. The hemorrhoids       were Grade I (internal hemorrhoids that do not prolapse).      The exam was otherwise without  abnormality on direct and retroflexion       views. Impression:            - Internal hemorrhoids.                        - The examination was otherwise normal on direct and                         retroflexion views.                        - No specimens collected. Recommendation:        - Discharge patient to home.                        - Resume previous diet.                        - Continue present medications.                        - Repeat colonoscopy is not recommended due to current                         age (60 years or older) for surveillance.                        - Return to referring physician as previously                         scheduled. Procedure Code(s):     --- Professional ---                        I3254, Colorectal cancer screening; colonoscopy on                         individual at high risk Diagnosis Code(s):     --- Professional ---  Z86.010, Personal history of colonic polyps                        K64.0, First degree hemorrhoids CPT copyright 2019 American Medical Association. All rights reserved. The codes documented in this report are preliminary and upon coder review may  be revised to meet current compliance requirements. Andrey Farmer MD, MD 10/02/2021 1:11:06 PM Number of Addenda: 0 Note Initiated On: 10/02/2021 12:38 PM Scope Withdrawal Time: 0 hours 6 minutes 51 seconds  Total Procedure Duration: 0 hours 11 minutes 11 seconds  Estimated Blood Loss:  Estimated blood loss: none.      [facility]

## 2021-10-02 NOTE — Interval H&P Note (Signed)
History and Physical Interval Note:  10/02/2021 12:41 PM  Sheila Rivas  has presented today for surgery, with the diagnosis of HX OF ADENOMATOUS POLYP OF COLON.  The various methods of treatment have been discussed with the patient and family. After consideration of risks, benefits and other options for treatment, the patient has consented to  Procedure(s): COLONOSCOPY WITH PROPOFOL (N/A) as a surgical intervention.  The patient's history has been reviewed, patient examined, no change in status, stable for surgery.  I have reviewed the patient's chart and labs.  Questions were answered to the patient's satisfaction.     Regis Bill  Ok to proceed with colonoscopy

## 2021-10-02 NOTE — H&P (Signed)
Outpatient short stay form Pre-procedure 10/02/2021  Sheila Bill, MD  Primary Physician: Myrene Buddy, NP  Reason for visit:  Surveillance colonoscopy  History of present illness:    71 y/o lady with history of hypertension here for surveillance colonoscopy. Last colonoscopy in 2017 with TA. No blood thinners. No significant abdominal surgeries. No family history of GI malignancies.    Current Facility-Administered Medications:    0.9 %  sodium chloride infusion, , Intravenous, Continuous, Dacari Beckstrand, Rossie Muskrat, MD  Medications Prior to Admission  Medication Sig Dispense Refill Last Dose   hydrochlorothiazide (HYDRODIURIL) 25 MG tablet Take 25 mg by mouth daily.   10/02/2021   losartan (COZAAR) 50 MG tablet Take 50 mg by mouth daily.   10/02/2021   acetaminophen (TYLENOL) 500 MG tablet Take 1,000 mg by mouth every 8 (eight) hours as needed for moderate pain.      Ascorbic Acid (VITAMIN C) 1000 MG tablet Take 1,000 mg by mouth daily.      ASPERCREME LIDOCAINE EX Apply 1 application topically daily as needed (pain).      Calcium Citrate-Vitamin D (CALCIUM CITRATE + D3 PO) Take 1 tablet by mouth daily.      celecoxib (CELEBREX) 200 MG capsule Take 200 mg by mouth daily as needed for moderate pain.   09/29/2021   Cholecalciferol (VITAMIN D3) 125 MCG (5000 UT) CAPS Take 5,000 Units by mouth daily.      cycloSPORINE (RESTASIS) 0.05 % ophthalmic emulsion Place 1 drop into both eyes 2 (two) times daily.      gabapentin (NEURONTIN) 300 MG capsule Take 300 mg by mouth See admin instructions. Take 300 mg 3 times daily, may take a fourth 300 mg dose as needed for pain      Ginkgo Biloba 500 MG CAPS Take 500 mg by mouth daily.      Magnesium 500 MG TABS Take 500 mg by mouth at bedtime.      methocarbamol (ROBAXIN) 500 MG tablet Take 1 tablet (500 mg total) by mouth every 8 (eight) hours as needed for muscle spasms. (Patient not taking: Reported on 10/02/2021) 90 tablet 0 Not Taking    Probiotic Product (PROBIOTIC PO) Take 1 capsule by mouth daily.      senna-docusate (SENOKOT-S) 8.6-50 MG tablet Take 1 tablet by mouth at bedtime as needed for mild constipation. 30 tablet 0    SUPER B COMPLEX/C PO Take 1 tablet by mouth daily.      Valerian Root 500 MG CAPS Take 500 mg by mouth at bedtime.      zinc gluconate 50 MG tablet Take 50 mg by mouth daily.        Allergies  Allergen Reactions   Hydromorphone Itching   Tramadol Itching    Redness, red puffy eyes     Past Medical History:  Diagnosis Date   Anemia    with pregnancy   Anxiety    Arthritis    osteo - hips, knees, lower back   Asthma    well controlled   Complication of anesthesia    Respirations dropped after shoulder surgery in 2013. spent the night   DDD (degenerative disc disease), lumbar    L4-L5   GERD (gastroesophageal reflux disease)    Hypertension    Motion sickness    [employer]   Osteoarthritis, hip, bilateral    Sleep apnea    no CPAP/ not tolerated    Review of systems:  Otherwise negative.    Physical  Exam  Gen: Alert, oriented. Appears stated age.  HEENT: PERRLA. Lungs: No respiratory distress CV: RRR Abd: soft, benign, no masses Ext: No edema    Planned procedures: Proceed with colonoscopy. The patient understands the nature of the planned procedure, indications, risks, alternatives and potential complications including but not limited to bleeding, infection, perforation, damage to internal organs and possible oversedation/side effects from anesthesia. The patient agrees and gives consent to proceed.  Please refer to procedure notes for findings, recommendations and patient disposition/instructions.     Sheila Bill, MD [facility] Gastroenterology

## 2021-10-02 NOTE — Transfer of Care (Signed)
Immediate Anesthesia Transfer of Care Note  Patient: Sheila Rivas  Procedure(s) Performed: COLONOSCOPY WITH PROPOFOL  Patient Location: PACU  Anesthesia Type:General  Level of Consciousness: awake  Airway & Oxygen Therapy: Patient Spontanous Breathing  Post-op Assessment: Report given to RN and Post -op Vital signs reviewed and stable  Post vital signs: Reviewed and stable  Last Vitals:  Vitals Value Taken Time  BP    Temp    Pulse    Resp    SpO2      Last Pain:  Vitals:   10/02/21 1229  TempSrc: Temporal         Complications: No notable events documented.

## 2021-10-02 NOTE — Anesthesia Preprocedure Evaluation (Signed)
Anesthesia Evaluation  Patient identified by MRN, date of birth, ID band Patient awake    Reviewed: Allergy & Precautions, NPO status , Patient's Chart, lab work & pertinent test results  History of Anesthesia Complications (+) history of anesthetic complications (decreased respiratory rate in PACU)  Airway Mallampati: III  TM Distance: <3 FB Neck ROM: full    Dental  (+) Chipped   Pulmonary asthma , sleep apnea ,    Pulmonary exam normal        Cardiovascular Exercise Tolerance: Good hypertension, (-) anginaNormal cardiovascular exam     Neuro/Psych PSYCHIATRIC DISORDERS  Neuromuscular disease    GI/Hepatic Neg liver ROS, GERD  Controlled,  Endo/Other  negative endocrine ROS  Renal/GU negative Renal ROS  negative genitourinary   Musculoskeletal   Abdominal   Peds  Hematology negative hematology ROS (+)   Anesthesia Other Findings Past Medical History: No date: Anemia     Comment:  with pregnancy No date: Anxiety No date: Arthritis     Comment:  osteo - hips, knees, lower back No date: Asthma     Comment:  well controlled No date: Complication of anesthesia     Comment:  Respirations dropped after shoulder surgery in 2013.               spent the night No date: DDD (degenerative disc disease), lumbar     Comment:  L4-L5 No date: GERD (gastroesophageal reflux disease) No date: Hypertension No date: Motion sickness     Comment:  [employer] No date: Osteoarthritis, hip, bilateral No date: Sleep apnea     Comment:  no CPAP/ not tolerated  Past Surgical History: 03/01/2021: ANTERIOR LATERAL LUMBAR FUSION WITH PERCUTANEOUS SCREW 1  LEVEL; N/A     Comment:  Procedure: L4-5 LATERAL INTERBODY FUSION, L4-5 POSTERIOR              FUSION;  Surgeon: Venetia Night, MD;  Location: [facility]              ORS;  Service: Neurosurgery;  Laterality: N/A; 2016: COLONOSCOPY 2013: SHOULDER ARTHROSCOPY WITH ROTATOR CUFF  REPAIR; Right 01/09/2019: SHOULDER ARTHROSCOPY WITH SUBACROMIAL DECOMPRESSION; Right 01/20/2020: TOTAL HIP ARTHROPLASTY; Left     Comment:  Procedure: TOTAL HIP ARTHROPLASTY;  Surgeon: Donato Heinz, MD;  Location: [facility] ORS;  Service: Orthopedics;               Laterality: Left; 08/01/2020: TOTAL HIP ARTHROPLASTY; Right     Comment:  Procedure: TOTAL HIP ARTHROPLASTY;  Surgeon: Donato Heinz, MD;  Location: [facility] ORS;  Service: Orthopedics;               Laterality: Right;  BMI    Body Mass Index: 30.80 kg/m      Reproductive/Obstetrics negative OB ROS                             Anesthesia Physical Anesthesia Plan  ASA: 3  Anesthesia Plan: General   Post-op Pain Management:    Induction: Intravenous  PONV Risk Score and Plan: Propofol infusion and TIVA  Airway Management Planned: Natural Airway and Nasal Cannula  Additional Equipment:   Intra-op Plan:   Post-operative Plan:   Informed Consent: I have reviewed the patients History and Physical, chart, labs and  discussed the procedure including the risks, benefits and alternatives for the proposed anesthesia with the patient or authorized representative who has indicated his/her understanding and acceptance.     Dental Advisory Given  Plan Discussed with: Anesthesiologist, CRNA and Surgeon  Anesthesia Plan Comments: (Patient consented for risks of anesthesia including but not limited to:  - adverse reactions to medications - risk of airway placement if required - damage to eyes, teeth, lips or other oral mucosa - nerve damage due to positioning  - sore throat or hoarseness - Damage to heart, brain, nerves, lungs, other parts of body or loss of life  Patient voiced understanding.)        Anesthesia Quick Evaluation

## 2021-10-02 NOTE — Anesthesia Postprocedure Evaluation (Signed)
Anesthesia Post Note  Patient: Tamyrah Burbage  Procedure(s) Performed: COLONOSCOPY WITH PROPOFOL  Patient location during evaluation: Endoscopy Anesthesia Type: General Level of consciousness: awake and alert Pain management: pain level controlled Vital Signs Assessment: post-procedure vital signs reviewed and stable Respiratory status: spontaneous breathing, nonlabored ventilation, respiratory function stable and patient connected to nasal cannula oxygen Cardiovascular status: blood pressure returned to baseline and stable Postop Assessment: no apparent nausea or vomiting Anesthetic complications: no   No notable events documented.   Last Vitals:  Vitals:   10/02/21 1323 10/02/21 1333  BP: 118/72 (!) 119/51  Pulse: 79 78  Resp: 18 17  Temp:    SpO2: 99% 99%    Last Pain:  Vitals:   10/02/21 1333  TempSrc:   PainSc: 0-No pain                 Corinda Gubler

## 2021-10-03 ENCOUNTER — Telehealth: Payer: Medicare HMO | Admitting: Family Medicine

## 2021-10-03 ENCOUNTER — Encounter: Payer: Self-pay | Admitting: Gastroenterology

## 2021-10-03 DIAGNOSIS — R300 Dysuria: Secondary | ICD-10-CM | POA: Diagnosis not present

## 2021-10-03 MED ORDER — CEPHALEXIN 500 MG PO CAPS: 500.0000 mg | ORAL_CAPSULE | Freq: Two times a day (BID) | ORAL | 0 refills | Status: AC

## 2021-10-03 NOTE — Progress Notes (Signed)
Virtual Visit Consent   Sheila Rivas, you are scheduled for a virtual visit with a [facility] provider today. Just as with appointments in the office, your consent must be obtained to participate. Your consent will be active for this visit and any virtual visit you may have with one of our providers in the next 365 days. If you have a MyChart account, a copy of this consent can be sent to you electronically.  As this is a virtual visit, video technology does not allow for your provider to perform a traditional examination. This may limit your provider's ability to fully assess your condition. If your provider identifies any concerns that need to be evaluated in person or the need to arrange testing (such as labs, EKG, etc.), we will make arrangements to do so. Although advances in technology are sophisticated, we cannot ensure that it will always work on either your end or our end. If the connection with a video visit is poor, the visit may have to be switched to a telephone visit. With either a video or telephone visit, we are not always able to ensure that we have a secure connection.  By engaging in this virtual visit, you consent to the provision of healthcare and authorize for your insurance to be billed (if applicable) for the services provided during this visit. Depending on your insurance coverage, you may receive a charge related to this service.  I need to obtain your verbal consent now. Are you willing to proceed with your visit today? Sheila Rivas has provided verbal consent on 10/03/2021 for a virtual visit (video or telephone). [name], NP  Date: 10/03/2021 9:46 AM  Virtual Visit via Video Note   I, [name], connected with  Sheila Rivas  ([id number], [birth year]) on 10/03/21 at  9:45 AM EDT by a video-enabled telemedicine application and verified that I am speaking with the correct person using two identifiers.  Location: Patient: Virtual Visit Location Patient:  Home Provider: Virtual Visit Location Provider: Home Office   I discussed the limitations of evaluation and management by telemedicine and the availability of in person appointments. The patient expressed understanding and agreed to proceed.    History of Present Illness: Sheila Rivas is a 71 y.o. who identifies as a female who was assigned female at birth, and is being seen today for dysuria. Recent colonoscopy prep- had UTI signs and then they eases off and she completed the prep and had the procedure and it went well.   Symptoms came back- urgency, frequency, burning,  No discharge, back, flank or pelvic pain, no n/v/d, fevers or chills..     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   DDD (degenerative disc disease), lumbar 04/23/2017    Allergies:  Allergies  Allergen Reactions   Hydromorphone Itching   Tramadol Itching    Redness, red puffy eyes   Medications:  Current Outpatient Medications:    acetaminophen (TYLENOL) 500 MG tablet, Take 1,000 mg by mouth every 8 (eight) hours as needed for moderate pain., Disp: , Rfl:    Ascorbic Acid (VITAMIN  C) 1000 MG tablet, Take 1,000 mg by mouth daily., Disp: , Rfl:    ASPERCREME LIDOCAINE EX, Apply 1 application topically daily as needed (pain)., Disp: , Rfl:    Calcium Citrate-Vitamin D (CALCIUM CITRATE + D3 PO), Take 1 tablet by mouth daily., Disp: , Rfl:    celecoxib (CELEBREX) 200 MG capsule, Take 200 mg by mouth daily as needed for moderate pain., Disp: , Rfl:    Cholecalciferol (VITAMIN D3) 125 MCG (5000 UT) CAPS,  Take 5,000 Units by mouth daily., Disp: , Rfl:    cycloSPORINE (RESTASIS) 0.05 % ophthalmic emulsion, Place 1 drop into both eyes 2 (two) times daily., Disp: , Rfl:    gabapentin (NEURONTIN) 300 MG capsule, Take 300 mg by mouth See admin instructions. Take 300 mg 3 times daily, may take a fourth 300 mg dose as needed for pain, Disp: , Rfl:    Ginkgo Biloba 500 MG CAPS, Take 500 mg by mouth daily., Disp: , Rfl:    hydrochlorothiazide (HYDRODIURIL) 25 MG tablet, Take 25 mg by mouth daily., Disp: , Rfl:    losartan (COZAAR) 50 MG tablet, Take 50 mg by mouth daily., Disp: , Rfl:    Magnesium 500 MG TABS, Take 500 mg by mouth at bedtime., Disp: , Rfl:    methocarbamol (ROBAXIN) 500 MG tablet, Take 1 tablet (500 mg total) by mouth every 8 (eight) hours as needed for muscle spasms. (Patient not taking: Reported on 10/02/2021), Disp: 90 tablet, Rfl: 0   Probiotic Product (PROBIOTIC PO), Take 1 capsule by mouth daily., Disp: , Rfl:    senna-docusate (SENOKOT-S) 8.6-50 MG tablet, Take 1 tablet by mouth at bedtime as needed for mild constipation., Disp: 30 tablet, Rfl: 0   SUPER B COMPLEX/C PO, Take 1 tablet by mouth daily., Disp: , Rfl:    Valerian Root 500 MG CAPS, Take 500 mg by mouth at bedtime., Disp: , Rfl:    zinc gluconate 50 MG tablet, Take 50 mg by mouth daily., Disp: , Rfl:   Observations/Objective:  No labored breathing.  Speech is clear and coherent with logical content.  Patient is alert and oriented at baseline.    Assessment and Plan: 1. Dysuria  - cephALEXin (KEFLEX) 500 MG capsule; Take 1 capsule (500 mg total) by mouth 2 (two) times daily for 7 days.  Dispense: 14 capsule; Refill: 0  Unable to connect via video, called patient and did audio appt Colonoscopy Prep related UTI will start treatment with strict follow up and sample needed if not improved or worsens.   Advised hydration and proper hygiene.   Reviewed side effects, risks and benefits of medication.    Patient  acknowledged agreement and understanding of the plan.   Past Medical, Surgical, Social History, Allergies, and Medications have been Reviewed.   Follow Up Instructions: I discussed the assessment and treatment plan with the patient. The patient was provided an opportunity to ask questions and all were answered. The patient agreed with the plan and demonstrated an understanding of the instructions.  A copy of instructions were sent to the patient via MyChart unless otherwise noted below.    The patient was advised to call back or seek an in-person evaluation if the symptoms worsen or if the condition fails to improve as anticipated.  Time:  I spent 15 minutes with the patient via telehealth technology discussing the above problems/concerns.    [name], NP

## 2021-10-03 NOTE — Patient Instructions (Signed)
Urinary Tract Infection, Adult  A urinary tract infection (UTI) is an infection of any part of the urinary tract. The urinary tract includes the kidneys, ureters, bladder, and urethra. These organs make, store, and get rid of urine in the body. An upper UTI affects the ureters and kidneys. A lower UTI affects the bladder and urethra. What are the causes? Most urinary tract infections are caused by bacteria in your genital area around your urethra, where urine leaves your body. These bacteria grow and cause inflammation of your urinary tract. What increases the risk? You are more likely to develop this condition if: You have a urinary catheter that stays in place. You are not able to control when you urinate or have a bowel movement (incontinence). You are female and you: Use a spermicide or diaphragm for birth control. Have low estrogen levels. Are pregnant. You have certain genes that increase your risk. You are sexually active. You take antibiotic medicines. You have a condition that causes your flow of urine to slow down, such as: An enlarged prostate, if you are female. Blockage in your urethra. A kidney stone. A nerve condition that affects your bladder control (neurogenic bladder). Not getting enough to drink, or not urinating often. You have certain medical conditions, such as: Diabetes. A weak disease-fighting system (immunesystem). Sickle cell disease. Gout. Spinal cord injury. What are the signs or symptoms? Symptoms of this condition include: Needing to urinate right away (urgency). Frequent urination. This may include small amounts of urine each time you urinate. Pain or burning with urination. Blood in the urine. Urine that smells bad or unusual. Trouble urinating. Cloudy urine. Vaginal discharge, if you are female. Pain in the abdomen or the lower back. You may also have: Vomiting or a decreased appetite. Confusion. Irritability or tiredness. A fever or  chills. Diarrhea. The first symptom in older adults may be confusion. In some cases, they may not have any symptoms until the infection has worsened. How is this diagnosed? This condition is diagnosed based on your medical history and a physical exam. You may also have other tests, including: Urine tests. Blood tests. Tests for STIs (sexually transmitted infections). If you have had more than one UTI, a cystoscopy or imaging studies may be done to determine the cause of the infections. How is this treated? Treatment for this condition includes: Antibiotic medicine. Over-the-counter medicines to treat discomfort. Drinking enough water to stay hydrated. If you have frequent infections or have other conditions such as a kidney stone, you may need to see a health care provider who specializes in the urinary tract (urologist). In rare cases, urinary tract infections can cause sepsis. Sepsis is a life-threatening condition that occurs when the body responds to an infection. Sepsis is treated in the hospital with IV antibiotics, fluids, and other medicines. Follow these instructions at home:  Medicines Take over-the-counter and prescription medicines only as told by your health care provider. If you were prescribed an antibiotic medicine, take it as told by your health care provider. Do not stop using the antibiotic even if you start to feel better. General instructions Make sure you: Empty your bladder often and completely. Do not hold urine for long periods of time. Empty your bladder after sex. Wipe from front to back after urinating or having a bowel movement if you are female. Use each tissue only one time when you wipe. Drink enough fluid to keep your urine pale yellow. Keep all follow-up visits. This is important. Contact a health   care provider if: Your symptoms do not get better after 1-2 days. Your symptoms go away and then return. Get help right away if: You have severe pain in  your back or your lower abdomen. You have a fever or chills. You have nausea or vomiting. Summary A urinary tract infection (UTI) is an infection of any part of the urinary tract, which includes the kidneys, ureters, bladder, and urethra. Most urinary tract infections are caused by bacteria in your genital area. Treatment for this condition often includes antibiotic medicines. If you were prescribed an antibiotic medicine, take it as told by your health care provider. Do not stop using the antibiotic even if you start to feel better. Keep all follow-up visits. This is important. This information is not intended to replace advice given to you by your health care provider. Make sure you discuss any questions you have with your health care provider. Document Revised: 11/20/2019 Document Reviewed: 11/20/2019 Elsevier Patient Education  2023 [employer]  

## 2021-11-29 ENCOUNTER — Other Ambulatory Visit: Payer: Self-pay

## 2021-11-29 DIAGNOSIS — Z981 Arthrodesis status: Secondary | ICD-10-CM

## 2021-11-30 ENCOUNTER — Ambulatory Visit
Admission: RE | Admit: 2021-11-30 | Discharge: 2021-11-30 | Disposition: A | Discharge status: Home / Self Care | Payer: Medicare HMO | Source: Ambulatory Visit | Attending: Neurosurgery | Admitting: Neurosurgery

## 2021-11-30 ENCOUNTER — Ambulatory Visit
Admission: RE | Admit: 2021-11-30 | Discharge: 2021-11-30 | Disposition: A | Discharge status: Home / Self Care | Payer: Medicare HMO | Source: Ambulatory Visit | Attending: Nurse Practitioner | Admitting: Nurse Practitioner

## 2021-11-30 ENCOUNTER — Ambulatory Visit: Payer: Medicare HMO | Admitting: Neurosurgery

## 2021-11-30 ENCOUNTER — Encounter: Payer: Self-pay | Admitting: Neurosurgery

## 2021-11-30 VITALS — BP 134/82 | Ht 61.9 in | Wt 162.8 lb

## 2021-11-30 DIAGNOSIS — Z981 Arthrodesis status: Secondary | ICD-10-CM | POA: Diagnosis present

## 2021-11-30 NOTE — Progress Notes (Signed)
   DOS: 03/01/21 (L4-5 XLIF/PSF)  HISTORY OF PRESENT ILLNESS: 11/30/2021 Ms. Sheila Rivas is status post the above surgery.  She is better than she was prior to surgery, but still has some discomfort on the front of her left leg prickly with changing positions.  She is taking combination of gabapentin and Celebrex which has helped with this.  Right now it is more annoying than activity limiting.   PHYSICAL EXAMINATION:   Vitals:   11/30/21 0925  BP: 134/82   General: Patient is well developed, well nourished, calm, collected, and in no apparent distress.  NEUROLOGICAL:  General: In no acute distress.  Awake, alert, oriented to person, place, and time. Pupils equal round and reactive to light.   Strength:  Side Iliopsoas Quads Hamstring PF DF EHL  R 5 5 5 5 5 5   L 5 5 5 5 5 5    Incision c/d/i   ROS (Neurologic): Negative except as noted above  IMAGING: No complications noted  ASSESSMENT/PLAN:  Sheila Rivas is doing fairly well after L4-5 intervention.  She will continue her current medical management.  I did review with her that dose escalation of her gabapentin is possible.  At this point she would like to continue as she is currently doing.  I will see her back on an as-needed basis.  I spent a total of 10 minutes in face-to-face and non-face-to-face activities related to this patient's care today.   MD, [facility] Department of Neurosurgery

## 2021-11-30 NOTE — Progress Notes (Deleted)
   DOS: ***  HISTORY OF PRESENT ILLNESS: 11/30/2021 Ms. Sheila Rivas is status post ***.   PHYSICAL EXAMINATION:   Vitals:   11/30/21 0925  BP: 134/82   General: Patient is well developed, well nourished, calm, collected, and in no apparent distress.  NEUROLOGICAL:  General: In no acute distress.  Awake, alert, oriented to person, place, and time. Pupils equal round and reactive to light.   Strength:  Side Iliopsoas Quads Hamstring PF DF EHL  R 5 5 5 5 5 5   L 5 5 5 5 5 5    Incision c/d/i   ROS (Neurologic): Negative except as noted above  IMAGING: No interval imaging to review   ASSESSMENT/PLAN:  Sheila Rivas is doing well after ***  I spent a total of *** minutes in face-to-face and non-face-to-face activities related to this patient's care today.   MD, [facility] Department of Neurosurgery

## 2022-01-19 ENCOUNTER — Telehealth: Payer: Self-pay

## 2022-01-19 NOTE — Telephone Encounter (Signed)
I have left a message for the patient to call back about celebrex refill.

## 2023-07-09 DIAGNOSIS — Z79899 Other long term (current) drug therapy: Secondary | ICD-10-CM | POA: Diagnosis not present

## 2023-07-09 DIAGNOSIS — M25511 Pain in right shoulder: Secondary | ICD-10-CM | POA: Diagnosis not present

## 2023-07-09 DIAGNOSIS — Z9889 Other specified postprocedural states: Secondary | ICD-10-CM | POA: Diagnosis not present

## 2023-07-09 DIAGNOSIS — E669 Obesity, unspecified: Secondary | ICD-10-CM | POA: Diagnosis not present

## 2023-07-09 DIAGNOSIS — I10 Essential (primary) hypertension: Secondary | ICD-10-CM | POA: Diagnosis not present

## 2023-07-09 DIAGNOSIS — J4520 Mild intermittent asthma, uncomplicated: Secondary | ICD-10-CM | POA: Diagnosis not present

## 2023-07-09 DIAGNOSIS — R7302 Impaired glucose tolerance (oral): Secondary | ICD-10-CM | POA: Diagnosis not present

## 2023-07-09 DIAGNOSIS — E782 Mixed hyperlipidemia: Secondary | ICD-10-CM | POA: Diagnosis not present

## 2023-07-09 DIAGNOSIS — Z6832 Body mass index (BMI) 32.0-32.9, adult: Secondary | ICD-10-CM | POA: Diagnosis not present

## 2023-08-01 DIAGNOSIS — Z96643 Presence of artificial hip joint, bilateral: Secondary | ICD-10-CM | POA: Diagnosis not present

## 2023-08-27 DIAGNOSIS — H524 Presbyopia: Secondary | ICD-10-CM | POA: Diagnosis not present

## 2023-08-27 DIAGNOSIS — Z0100 Encounter for examination of eyes and vision without abnormal findings: Secondary | ICD-10-CM | POA: Diagnosis not present

## 2023-08-30 DIAGNOSIS — Z9889 Other specified postprocedural states: Secondary | ICD-10-CM | POA: Diagnosis not present

## 2023-08-30 DIAGNOSIS — S56911A Strain of unspecified muscles, fascia and tendons at forearm level, right arm, initial encounter: Secondary | ICD-10-CM | POA: Diagnosis not present

## 2023-08-30 DIAGNOSIS — M25511 Pain in right shoulder: Secondary | ICD-10-CM | POA: Diagnosis not present

## 2023-08-30 DIAGNOSIS — M25521 Pain in right elbow: Secondary | ICD-10-CM | POA: Diagnosis not present

## 2023-08-30 DIAGNOSIS — S53401A Unspecified sprain of right elbow, initial encounter: Secondary | ICD-10-CM | POA: Diagnosis not present

## 2023-08-30 DIAGNOSIS — M7581 Other shoulder lesions, right shoulder: Secondary | ICD-10-CM | POA: Diagnosis not present

## 2023-08-30 DIAGNOSIS — M542 Cervicalgia: Secondary | ICD-10-CM | POA: Diagnosis not present

## 2023-08-30 DIAGNOSIS — M47812 Spondylosis without myelopathy or radiculopathy, cervical region: Secondary | ICD-10-CM | POA: Diagnosis not present

## 2023-09-06 DIAGNOSIS — H2513 Age-related nuclear cataract, bilateral: Secondary | ICD-10-CM | POA: Diagnosis not present

## 2023-09-06 DIAGNOSIS — H35371 Puckering of macula, right eye: Secondary | ICD-10-CM | POA: Diagnosis not present

## 2023-09-06 DIAGNOSIS — H43811 Vitreous degeneration, right eye: Secondary | ICD-10-CM | POA: Diagnosis not present

## 2023-10-07 NOTE — Therapy (Signed)
 OUTPATIENT OCCUPATIONAL THERAPY ORTHO EVALUATION  Patient Name: Sheila Rivas MRN: [id number] DOB:30-Dec-1950, 73 y.o., female Today's Date: 10/08/2023  PCP: Thais Fill NP REFERRING PROVIDER: Dr Daun Epstein  END OF SESSION:  OT End of Session - 10/08/23 1108     Visit Number 1    Number of Visits 12    Date for OT Re-Evaluation 12/03/23    OT Start Time 1108    OT Stop Time 1202    OT Time Calculation (min) 54 min    Activity Tolerance Patient tolerated treatment well    Behavior During Therapy WFL for tasks assessed/performed          Past Medical History:  Diagnosis Date   Anemia    with pregnancy   Anxiety    Arthritis    osteo - hips, knees, lower back   Asthma    well controlled   Complication of anesthesia    Respirations dropped after shoulder surgery in 2013. spent the night   DDD (degenerative disc disease), lumbar    L4-L5   GERD (gastroesophageal reflux disease)    Hypertension    Motion sickness    [employer] theaters   Osteoarthritis, hip, bilateral    Sleep apnea    no CPAP/ not tolerated   Past Surgical History:  Procedure Laterality Date   ANTERIOR LATERAL LUMBAR FUSION WITH PERCUTANEOUS SCREW 1 LEVEL N/A 03/01/2021   Procedure: L4-5 LATERAL INTERBODY FUSION, L4-5 POSTERIOR FUSION;  Surgeon: Jodeen Munch, MD;  Location: [facility];  Service: Neurosurgery;  Laterality: N/A;   COLONOSCOPY  2016   COLONOSCOPY WITH PROPOFOL  N/A 10/02/2021   Procedure: COLONOSCOPY WITH PROPOFOL ;  Surgeon: Shane Darling, MD;  Location: [facility];  Service: Endoscopy;  Laterality: N/A;   SHOULDER ARTHROSCOPY WITH ROTATOR CUFF REPAIR Right 2013   SHOULDER ARTHROSCOPY WITH SUBACROMIAL DECOMPRESSION Right 01/09/2019   TOTAL HIP ARTHROPLASTY Left 01/20/2020   Procedure: TOTAL HIP ARTHROPLASTY;  Surgeon: Arlyne Lame, MD;  Location: [facility];  Service: Orthopedics;  Laterality: Left;   TOTAL HIP ARTHROPLASTY Right 08/01/2020   Procedure: TOTAL HIP ARTHROPLASTY;  Surgeon:  Arlyne Lame, MD;  Location: [facility];  Service: Orthopedics;  Laterality: Right;   Patient Active Problem List   Diagnosis Date Noted   S/P lumbar fusion 03/01/2021   H/O total hip arthroplasty 08/01/2020   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   Hypertension 01/19/2020   Mild intermittent asthma 01/19/2020   Obesity (BMI 30-39.9) 01/19/2020   Chronic bilateral low back pain with left-sided sciatica 07/27/2019   Foraminal stenosis of lumbar region 07/27/2019   Osteoarthritis of hips, bilateral 10/21/2017   DDD (degenerative disc disease), lumbar 04/23/2017    ONSET DATE: Jan 25  REFERRING DIAG: R elbow and forearm pain  THERAPY DIAG:  Pain in right elbow  Pain in right forearm  Stiffness of right wrist, not elsewhere classified  Stiffness of right elbow, not elsewhere classified  Muscle weakness (generalized)  Rationale for Evaluation and Treatment: Rehabilitation  SUBJECTIVE:   SUBJECTIVE STATEMENT: I had a rotator cuff surgery around 4 years ago.  But they said everything was good I had some elbow pain at that time but.  And then about January I walked my dog and it was startled and jerked my arm.  I had like really sharp pain in the top of my forearm into my elbow.  And then since then I have had throbbing pain in the forearm into the elbow and sometimes up in the  upper arm.  It bothers mowing the lawn, driving, holding gripping objects. Pt accompanied by: self  PERTINENT HISTORY: Dr Daun Epstein 08/30/23 Assessment:  Marvell Slider walking her dog 3-4 months prior to appt  Order send 09/13/23  Encounter Diagnoses  Name Primary?  Acute pain of right shoulder  Neck pain  Right elbow pain Yes  Cervical spondylosis  Status post right rotator cuff repair  Rotator cuff tendinitis, right  Strain of right forearm, initial encounter  Sprain of right elbow, initial encounter   Plan:  The treatment options were discussed with the patient. In addition, patient educational  materials were provided regarding the diagnosis and treatment options. Although the patient does have moderate degenerative changes of her shoulder, I do not feel that majority of her forearm symptoms are actually coming from her shoulder. Furthermore, although she has significant degenerative changes of her neck, she has no pain with neck motion and no obvious nerve deficits to her right upper extremity. Therefore, I feel that the majority of her symptoms are coming from her elbow and forearm. The patient is quite frustrated by her symptoms and functional limitations, but is hesitant to consider more aggressive treatment options. Therefore I have recommended that she be referred to occupational therapy for range of motion and strength exercises as well as modalities to reduce inflammation. She may continue with her normal daily activities but is to avoid offending activities. She may take over-the-counter medications as needed for discomfort. All of the patient's questions and concerns were answered. She can call any time with further concerns. She will follow up on an as necessary basis per her request. This office visit took 60 minutes, of which >50% involved patient counseling/education.   PRECAUTIONS: None   WEIGHT BEARING RESTRICTIONS: No  PAIN:  Are you having pain?  Resting pain1/10 but it can increase to 8/10 over the forearm into the elbow  FALLS: Has patient fallen in last 6 months? No  LIVING ENVIRONMENT: Lives with: lives with their family  PLOF: Lives with family.  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  She works in the yard, Airline pilot.  Do some YouTube workouts and help with her 15-year-old grand daughter 40 lbs dog  PATIENT GOALS: I want the pain better my right forearm and elbow so that I can work in the yard, do things around the house, do my workout videos,    OBJECTIVE:  Note: Objective measures were completed at Evaluation unless  otherwise noted.  HAND DOMINANCE: Right  ADLs: Increased pain with gripping and pulling and lifting and pushing and any sustained grip pain increases to 8/10   FUNCTIONAL OUTCOME MEASURES: To be done next session  UPPER EXTREMITY ROM:     Active ROM Right eval Left eval  Shoulder flexion    Shoulder abduction    Shoulder adduction    Shoulder extension    Shoulder internal rotation    Shoulder external rotation    Elbow flexion    Elbow extension -20   Wrist flexion 78 loose fist  62   Wrist extension 58   Wrist ulnar deviation    Wrist radial deviation    Wrist pronation 90   Wrist supination 70   (Blank rows = not tested)  Active ROM Right eval Left eval  Thumb MCP (0-60)    Thumb IP (0-80)    Thumb Radial abd/add (0-55)     Thumb Palmar abd/add (0-45)     Thumb Opposition  to Small Finger     Index MCP (0-90)     Index PIP (0-100)     Index DIP (0-70)      Long MCP (0-90)      Long PIP (0-100)      Long DIP (0-70)      Ring MCP (0-90)      Ring PIP (0-100)      Ring DIP (0-70)      Little MCP (0-90)      Little PIP (0-100)      Little DIP (0-70)      Digits flexion and thumb range of motion within normal limits including opposition    HAND FUNCTION: Grip strength: Right: 35 extended arm 34  lbs; Left: 60 extended arm 56 lbs, Lateral pinch: Right: 9 lbs, Left: 12 lbs, and 3 point pinch: Right: 13 lbs, Left: 13 lbs  COORDINATION: Within functional limits  SENSATION: Denies any sensory issues  EDEMA: Will assess next session  COGNITION: Overall cognitive status: Within functional limits for tasks assessed      TREATMENT DATE: 10/08/23                                                                                                                            Modalities: Paraffin:  Time: 8 Location: Right elbow, forearm and wrist Prior to supination and wrist flexion extension stretch to increase motion. Afterwards elbow extension was full.   Patient was able to tolerate active assisted range of motion passive range of motion to forearm supination as well as open hand as well as composite wrist flexion and extension stretch. 10 reps hold 5 seconds  Patient to do the same but using a heating pad.  2-3 times a day. Avoid over gripping pulling, sustained grip as well as grip in combination with wrist flexion.       PATIENT EDUCATION: Education details: findings of eval and HEP  Person educated: Patient Education method: Explanation, Demonstration, Tactile cues, Verbal cues, and Handouts Education comprehension: verbalized understanding, returned demonstration, verbal cues required, and needs further education   GOALS: Goals reviewed with patient? Yes   LONG TERM GOALS: Target date: 8 wks  Patient to be independent in home program to increase elbow extension and supination by 10 to 20 degrees to hold small objects or medications in the palm and reach behind her symptom-free Baseline: Elbow extension -20 degrees.  Coming in and supination 70 degrees.  With pain with passive range of motion at the elbow but also into the bicep. Goal status: INITIAL  2.  Right wrist flexion and extension range of motion improved to within normal limits for patient to be able to reach behind her back.  Hearing aids in and apply deodorant symptom-free Baseline: Wrist extension 58 degrees flexion open hand 78 loose wrist 62. Goal status: INITIAL  3.  Pain in right elbow and forearm improved to less than 2/10 with yard work as well as bathing and  dressing with upper body and grooming activities Baseline: Pain increased to 8/10 over the forearm as well as elbow into the upper arm. Goal status: INITIAL  4.  Right grip strength improved by 5 to 10 pounds for patient to be able to mow the lawn and drive and perform gripping activities and ADLs and IADLs symptom-free. Baseline: Grip 35 pounds on the right and 60 on the left.  Patient has increased pain  8/10 with anything gripping or sustained gripping, mowing the lawn driving. Goal status: INITIAL  ASSESSMENT:  CLINICAL IMPRESSION: Patient seen today for occupational therapy evaluation for right forearm and elbow pain since had a traction injury rhe with walking her dog in January.  Patient had shoulder surgery about 4 years ago.  Per patient rotator cuff surgery.  Patient present today with supination 70 degrees and elbow extension -20.  Patient report she was limited in this motion since shoulder surgery as well as had elbow pain after surgery.  Patient present at OT eval with decreased supination as well as wrist flexion extension as well as grip strength.  Patient pain can increase to 8/10 with repetitive or sustained gripping and lifting and pushing and pulling activities.  Patient limited in functional use of right dominant hand in ADLs and IADLs.  Patient can benefit from skilled OT services to decrease pain increase motion and increase strength in right upper extremity to be able to be independent and symptom-free and ADLs and IADLs.  PERFORMANCE DEFICITS: in functional skills including ADLs, IADLs, ROM, strength, pain, flexibility, decreased knowledge of use of DME, and UE functional use,   and psychosocial skills including environmental adaptation and routines and behaviors.   IMPAIRMENTS: are limiting patient from ADLs, IADLs, rest and sleep, play, leisure, and social participation.   COMORBIDITIES: has no other co-morbidities that affects occupational performance. Patient will benefit from skilled OT to address above impairments and improve overall function.  MODIFICATION OR ASSISTANCE TO COMPLETE EVALUATION:  modification of tasks or assist necessary to complete an evaluation.  OT OCCUPATIONAL PROFILE AND HISTORY: Problem focused assessment: Including review of records relating to presenting problem.  Reviewed history of shoulder surgeries as well as recovery and outcomes.  CLINICAL  DECISION MAKING: Moderate-had shoulder surgery in the past,  task modification necessary  REHAB POTENTIAL: Good for goals  EVALUATION COMPLEXITY: Moderate      PLAN:  OT FREQUENCY: 1-2x/week  OT DURATION: 8 weeks  PLANNED INTERVENTIONS: 97168 OT Re-evaluation, 97535 self care/ADL training, 16109 therapeutic exercise, 97530 therapeutic activity, 97112 neuromuscular re-education, 97140 manual therapy, 97035 ultrasound, 97018 paraffin, 60454 fluidotherapy, 97034 contrast bath, 97033 iontophoresis, passive range of motion, patient/family education, and DME and/or AE instructions    CONSULTED AND AGREED WITH PLAN OF CARE: Patient     Heloise Lobo, OTR/L,CLT 10/08/2023, 4:01 PM

## 2023-10-08 ENCOUNTER — Encounter: Payer: Self-pay | Admitting: Occupational Therapy

## 2023-10-08 ENCOUNTER — Ambulatory Visit: Attending: Surgery | Admitting: Occupational Therapy

## 2023-10-08 DIAGNOSIS — M6281 Muscle weakness (generalized): Secondary | ICD-10-CM | POA: Diagnosis not present

## 2023-10-08 DIAGNOSIS — M79631 Pain in right forearm: Secondary | ICD-10-CM | POA: Diagnosis not present

## 2023-10-08 DIAGNOSIS — M25631 Stiffness of right wrist, not elsewhere classified: Secondary | ICD-10-CM | POA: Insufficient documentation

## 2023-10-08 DIAGNOSIS — M25621 Stiffness of right elbow, not elsewhere classified: Secondary | ICD-10-CM | POA: Insufficient documentation

## 2023-10-08 DIAGNOSIS — M25521 Pain in right elbow: Secondary | ICD-10-CM | POA: Insufficient documentation

## 2023-10-11 ENCOUNTER — Ambulatory Visit: Admitting: Occupational Therapy

## 2023-10-11 DIAGNOSIS — M79631 Pain in right forearm: Secondary | ICD-10-CM | POA: Diagnosis not present

## 2023-10-11 DIAGNOSIS — M25521 Pain in right elbow: Secondary | ICD-10-CM | POA: Diagnosis not present

## 2023-10-11 DIAGNOSIS — M25631 Stiffness of right wrist, not elsewhere classified: Secondary | ICD-10-CM | POA: Diagnosis not present

## 2023-10-11 DIAGNOSIS — M25621 Stiffness of right elbow, not elsewhere classified: Secondary | ICD-10-CM | POA: Diagnosis not present

## 2023-10-11 DIAGNOSIS — M6281 Muscle weakness (generalized): Secondary | ICD-10-CM | POA: Diagnosis not present

## 2023-10-11 NOTE — Therapy (Signed)
 OUTPATIENT OCCUPATIONAL THERAPY ORTHOTREATMENT  Patient Name: Sheila Rivas MRN: [id number] DOB:February 24, 1951, 73 y.o., female Today's Date: 10/11/2023  PCP: Thais Fill NP REFERRING PROVIDER: Dr Daun Epstein  END OF SESSION:  OT End of Session - 10/11/23 1116     Visit Number 2    Number of Visits 12    Date for OT Re-Evaluation 12/03/23    OT Start Time 1116    OT Stop Time 1203    OT Time Calculation (min) 47 min    Activity Tolerance Patient tolerated treatment well    Behavior During Therapy WFL for tasks assessed/performed          Past Medical History:  Diagnosis Date   Anemia    with pregnancy   Anxiety    Arthritis    osteo - hips, knees, lower back   Asthma    well controlled   Complication of anesthesia    Respirations dropped after shoulder surgery in 2013. spent the night   DDD (degenerative disc disease), lumbar    L4-L5   GERD (gastroesophageal reflux disease)    Hypertension    Motion sickness    [employer] theaters   Osteoarthritis, hip, bilateral    Sleep apnea    no CPAP/ not tolerated   Past Surgical History:  Procedure Laterality Date   ANTERIOR LATERAL LUMBAR FUSION WITH PERCUTANEOUS SCREW 1 LEVEL N/A 03/01/2021   Procedure: L4-5 LATERAL INTERBODY FUSION, L4-5 POSTERIOR FUSION;  Surgeon: Jodeen Munch, MD;  Location: [facility];  Service: Neurosurgery;  Laterality: N/A;   COLONOSCOPY  2016   COLONOSCOPY WITH PROPOFOL  N/A 10/02/2021   Procedure: COLONOSCOPY WITH PROPOFOL ;  Surgeon: Shane Darling, MD;  Location: [facility];  Service: Endoscopy;  Laterality: N/A;   SHOULDER ARTHROSCOPY WITH ROTATOR CUFF REPAIR Right 2013   SHOULDER ARTHROSCOPY WITH SUBACROMIAL DECOMPRESSION Right 01/09/2019   TOTAL HIP ARTHROPLASTY Left 01/20/2020   Procedure: TOTAL HIP ARTHROPLASTY;  Surgeon: Arlyne Lame, MD;  Location: [facility];  Service: Orthopedics;  Laterality: Left;   TOTAL HIP ARTHROPLASTY Right 08/01/2020   Procedure: TOTAL HIP ARTHROPLASTY;  Surgeon:  Arlyne Lame, MD;  Location: [facility];  Service: Orthopedics;  Laterality: Right;   Patient Active Problem List   Diagnosis Date Noted   S/P lumbar fusion 03/01/2021   H/O total hip arthroplasty 08/01/2020   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   Hypertension 01/19/2020   Mild intermittent asthma 01/19/2020   Obesity (BMI 30-39.9) 01/19/2020   Chronic bilateral low back pain with left-sided sciatica 07/27/2019   Foraminal stenosis of lumbar region 07/27/2019   Osteoarthritis of hips, bilateral 10/21/2017   DDD (degenerative disc disease), lumbar 04/23/2017    ONSET DATE: Jan 25  REFERRING DIAG: R elbow and forearm pain  THERAPY DIAG:  Pain in right elbow  Pain in right forearm  Stiffness of right wrist, not elsewhere classified  Stiffness of right elbow, not elsewhere classified  Muscle weakness (generalized)  Rationale for Evaluation and Treatment: Rehabilitation  SUBJECTIVE:   SUBJECTIVE STATEMENT: My arm is better.  When I reaching of less pain.  And I think the rotation is better.   Pt accompanied by: self  PERTINENT HISTORY: Dr Daun Epstein 08/30/23 Assessment:  Marvell Slider walking her dog 3-4 months prior to appt  Order send 09/13/23  Encounter Diagnoses  Name Primary?  Acute pain of right shoulder  Neck pain  Right elbow pain Yes  Cervical spondylosis  Status post right rotator cuff repair  Rotator cuff tendinitis, right  Strain of right forearm, initial encounter  Sprain of right elbow, initial encounter   Plan:  The treatment options were discussed with the patient. In addition, patient educational materials were provided regarding the diagnosis and treatment options. Although the patient does have moderate degenerative changes of her shoulder, I do not feel that majority of her forearm symptoms are actually coming from her shoulder. Furthermore, although she has significant degenerative changes of her neck, she has no pain with neck motion and no  obvious nerve deficits to her right upper extremity. Therefore, I feel that the majority of her symptoms are coming from her elbow and forearm. The patient is quite frustrated by her symptoms and functional limitations, but is hesitant to consider more aggressive treatment options. Therefore I have recommended that she be referred to occupational therapy for range of motion and strength exercises as well as modalities to reduce inflammation. She may continue with her normal daily activities but is to avoid offending activities. She may take over-the-counter medications as needed for discomfort. All of the patient's questions and concerns were answered. She can call any time with further concerns. She will follow up on an as necessary basis per her request. This office visit took 60 minutes, of which >50% involved patient counseling/education.   PRECAUTIONS: None   WEIGHT BEARING RESTRICTIONS: No  PAIN:  Are you having pain?  Resting pain1/10 pain decreased to 4-5/10 with reaching  FALLS: Has patient fallen in last 6 months? No  LIVING ENVIRONMENT: Lives with: lives with their family  PLOF: Lives with family.  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  She works in the yard, Airline pilot.  Do some YouTube workouts and help with her 53-year-old grand daughter 40 lbs dog  PATIENT GOALS: I want the pain better my right forearm and elbow so that I can work in the yard, do things around the house, do my workout videos,    OBJECTIVE:  Note: Objective measures were completed at Evaluation unless otherwise noted.  HAND DOMINANCE: Right  ADLs: Increased pain with gripping and pulling and lifting and pushing and any sustained grip pain increases to 8/10   FUNCTIONAL OUTCOME MEASURES: To be done next session  UPPER EXTREMITY ROM:     Active ROM Right eval Left eval R 10/11/23  Shoulder flexion     Shoulder abduction     Shoulder adduction     Shoulder  extension     Shoulder internal rotation     Shoulder external rotation     Elbow flexion     Elbow extension -20  0  Wrist flexion 78 loose fist  62  78 and 72 loose fist   Wrist extension 58  60  Wrist ulnar deviation     Wrist radial deviation     Wrist pronation 90    Wrist supination 70  80  (Blank rows = not tested)  Active ROM Right eval Left eval  Thumb MCP (0-60)    Thumb IP (0-80)    Thumb Radial abd/add (0-55)     Thumb Palmar abd/add (0-45)     Thumb Opposition to Small Finger     Index MCP (0-90)     Index PIP (0-100)     Index DIP (0-70)      Long MCP (0-90)      Long PIP (0-100)      Long DIP (0-70)      Ring MCP (0-90)  Ring PIP (0-100)      Ring DIP (0-70)      Little MCP (0-90)      Little PIP (0-100)      Little DIP (0-70)      Digits flexion and thumb range of motion within normal limits including opposition    HAND FUNCTION: Grip strength: Right: 35 extended arm 34  lbs; Left: 60 extended arm 56 lbs, Lateral pinch: Right: 9 lbs, Left: 12 lbs, and 3 point pinch: Right: 13 lbs, Left: 13 lbs Grip strength: Right: 51 extended arm 55  lbs; Left: 60 extended arm 56 lbs, Lateral pinch: Right: 9 lbs, Left: 12 lbs, and 3 point pinch: Right: 13 lbs, Left: 13 lbs  COORDINATION: Within functional limits  SENSATION: Denies any sensory issues  EDEMA: Will assess next session  COGNITION: Overall cognitive status: Within functional limits for tasks assessed      TREATMENT DATE: 10/11/23                                                                                                                           Pt arrive with great progress in AROM in R elbow extension as well as forearm/wrist - see flowsheet  Improved and pain pain and grip strength see flowsheet Modalities: Paraffin:  Time: 8 Location: Right elbow, forearm and wrist Had patient in supine with elbow extension and supination stretch with a heating pad   Followed by manual therapy  Graston tool over volar and dorsal forearm.  As well as myofascial release of the bicep volar elbow and forearm with a gentle extension stretch as well as in supination. Gentle traction with joint mope's at the wrist.  With facilitation of supination and wrist extension. Carpal spreads   Patient was able to tolerate active assisted range of motion  and passive range of motion to elbow extension and  forearm supination 10 reps hold 5 seconds Followed by elbow flexion and extension maintaining supination position in supine.  10 reps. Patient to do the same at home in supine elbow extension with supination stretch with heating pad followed by elbow flexion extension and supination.    Change home program 216 ounce hammer in sitting doing active assisted range of motion for supination with elbow flexed at 90 degrees 12 reps Prayer stretch 12 reps hold 5 seconds Wrist flexion over the armrest open hand and then loose fist 10 reps hold 5 seconds.   PATIENT EDUCATION: Education details: findings of eval and HEP  Person educated: Patient Education method: Explanation, Demonstration, Tactile cues, Verbal cues, and Handouts Education comprehension: verbalized understanding, returned demonstration, verbal cues required, and needs further education   GOALS: Goals reviewed with patient? Yes   LONG TERM GOALS: Target date: 8 wks  Patient to be independent in home program to increase elbow extension and supination by 10 to 20 degrees to hold small objects or medications in the palm and reach behind her symptom-free Baseline: Elbow extension -20 degrees.  Coming in and supination 70 degrees.  With pain with passive range of motion at the elbow but also into the bicep. Goal status: INITIAL  2.  Right wrist flexion and extension range of motion improved to within normal limits for patient to be able to reach behind her back.  Hearing aids in and apply deodorant symptom-free Baseline: Wrist extension 58  degrees flexion open hand 78 loose wrist 62. Goal status: INITIAL  3.  Pain in right elbow and forearm improved to less than 2/10 with yard work as well as bathing and dressing with upper body and grooming activities Baseline: Pain increased to 8/10 over the forearm as well as elbow into the upper arm. Goal status: INITIAL  4.  Right grip strength improved by 5 to 10 pounds for patient to be able to mow the lawn and drive and perform gripping activities and ADLs and IADLs symptom-free. Baseline: Grip 35 pounds on the right and 60 on the left.  Patient has increased pain 8/10 with anything gripping or sustained gripping, mowing the lawn driving. Goal status: INITIAL  ASSESSMENT:  CLINICAL IMPRESSION: Patient seen for occupational therap for right forearm and elbow pain since had a traction injury rhe with walking her dog in January.  Patient had shoulder surgery about 4 years ago.  Per patient rotator cuff surgery.  Patient present today with supination 70 degrees and elbow extension -20.  Patient report she was limited in this motion since shoulder surgery as well as had elbow pain after surgery.  Patient present at OT eval with decreased supination as well as wrist flexion extension as well as grip strength.  Patient pain can increase to 8/10 with repetitive or sustained gripping and lifting and pushing and pulling activities.  NOW patient arrives with pain decreased to 4-5/10.  Patient with normal elbow extension and supination improved 10 degrees as well as wrist flexion increased more than extension.  Patient tolerated treatment well changed home program to active assisted range of motion and passive range of motion for wrist extension, composite wrist flexion as well as supination.  Grip strength increase greatly with less pain.  Patient limited in functional use of right dominant hand in ADLs and IADLs.  Patient can benefit from skilled OT services to decrease pain increase motion and increase  strength in right upper extremity to be able to be independent and symptom-free and ADLs and IADLs.  PERFORMANCE DEFICITS: in functional skills including ADLs, IADLs, ROM, strength, pain, flexibility, decreased knowledge of use of DME, and UE functional use,   and psychosocial skills including environmental adaptation and routines and behaviors.   IMPAIRMENTS: are limiting patient from ADLs, IADLs, rest and sleep, play, leisure, and social participation.   COMORBIDITIES: has no other co-morbidities that affects occupational performance. Patient will benefit from skilled OT to address above impairments and improve overall function.  MODIFICATION OR ASSISTANCE TO COMPLETE EVALUATION:  modification of tasks or assist necessary to complete an evaluation.  OT OCCUPATIONAL PROFILE AND HISTORY: Problem focused assessment: Including review of records relating to presenting problem.  Reviewed history of shoulder surgeries as well as recovery and outcomes.  CLINICAL DECISION MAKING: Moderate-had shoulder surgery in the past,  task modification necessary  REHAB POTENTIAL: Good for goals  EVALUATION COMPLEXITY: Moderate      PLAN:  OT FREQUENCY: 1-2x/week  OT DURATION: 8 weeks  PLANNED INTERVENTIONS: 97168 OT Re-evaluation, 97535 self care/ADL training, 16109 therapeutic exercise, 97530 therapeutic activity, 97112 neuromuscular re-education, 97140 manual therapy, 97035 ultrasound,  16109 paraffin, 60454 fluidotherapy, 97034 contrast bath, 97033 iontophoresis, passive range of motion, patient/family education, and DME and/or AE instructions    CONSULTED AND AGREED WITH PLAN OF CARE: Patient     Heloise Lobo, OTR/L,CLT 10/11/2023, 12:07 PM

## 2023-10-14 ENCOUNTER — Ambulatory Visit: Admitting: Occupational Therapy

## 2023-10-14 DIAGNOSIS — M6281 Muscle weakness (generalized): Secondary | ICD-10-CM | POA: Diagnosis not present

## 2023-10-14 DIAGNOSIS — M79631 Pain in right forearm: Secondary | ICD-10-CM

## 2023-10-14 DIAGNOSIS — M25631 Stiffness of right wrist, not elsewhere classified: Secondary | ICD-10-CM

## 2023-10-14 DIAGNOSIS — M25521 Pain in right elbow: Secondary | ICD-10-CM

## 2023-10-14 DIAGNOSIS — M25621 Stiffness of right elbow, not elsewhere classified: Secondary | ICD-10-CM | POA: Diagnosis not present

## 2023-10-14 NOTE — Therapy (Signed)
 OUTPATIENT OCCUPATIONAL THERAPY ORTHO TREATMENT  Patient Name: Sheila Rivas MRN: [id number] DOB:10/31/1950, 73 y.o., female Today's Date: 10/14/2023  PCP: Don NP REFERRING PROVIDER: [name]  END OF SESSION:  OT End of Session - 10/14/23 0903     Visit Number 3    Number of Visits 12    Date for OT Re-Evaluation 12/03/23    OT Start Time 0904    OT Stop Time 0947    OT Time Calculation (min) 43 min    Activity Tolerance Patient tolerated treatment well    Behavior During Therapy [facility] for tasks assessed/performed          Past Medical History:  Diagnosis Date   Anemia    with pregnancy   Anxiety    Arthritis    osteo - hips, knees, lower back   Asthma    well controlled   Complication of anesthesia    Respirations dropped after shoulder surgery in 2013. spent the night   DDD (degenerative disc disease), lumbar    L4-L5   GERD (gastroesophageal reflux disease)    Hypertension    Motion sickness    [employer] theaters   Osteoarthritis, hip, bilateral    Sleep apnea    no CPAP/ not tolerated   Past Surgical History:  Procedure Laterality Date   ANTERIOR LATERAL LUMBAR FUSION WITH PERCUTANEOUS SCREW 1 LEVEL N/A 03/01/2021   Procedure: L4-5 LATERAL INTERBODY FUSION, L4-5 POSTERIOR FUSION;  Surgeon: Clois Fret, MD;  Location: [facility];  Service: Neurosurgery;  Laterality: N/A;   COLONOSCOPY  2016   COLONOSCOPY WITH PROPOFOL  N/A 10/02/2021   Procedure: COLONOSCOPY WITH PROPOFOL ;  Surgeon: Maryruth Ole DASEN, MD;  Location: [facility];  Service: Endoscopy;  Laterality: N/A;   SHOULDER ARTHROSCOPY WITH ROTATOR CUFF REPAIR Right 2013   SHOULDER ARTHROSCOPY WITH SUBACROMIAL DECOMPRESSION Right 01/09/2019   TOTAL HIP ARTHROPLASTY Left 01/20/2020   Procedure: TOTAL HIP ARTHROPLASTY;  Surgeon: Mardee Lynwood SQUIBB, MD;  Location: [facility];  Service: Orthopedics;  Laterality: Left;   TOTAL HIP ARTHROPLASTY Right 08/01/2020   Procedure: TOTAL HIP ARTHROPLASTY;  Surgeon:  Mardee Lynwood SQUIBB, MD;  Location: [facility];  Service: Orthopedics;  Laterality: Right;   Patient Active Problem List   Diagnosis Date Noted   S/P lumbar fusion 03/01/2021   H/O total hip arthroplasty 08/01/2020   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   Hypertension 01/19/2020   Mild intermittent asthma 01/19/2020   Obesity (BMI 30-39.9) 01/19/2020   Chronic bilateral low back pain with left-sided sciatica 07/27/2019   Foraminal stenosis of lumbar region 07/27/2019   Osteoarthritis of hips, bilateral 10/21/2017   DDD (degenerative disc disease), lumbar 04/23/2017    ONSET DATE: Jan 25  REFERRING DIAG: R elbow and forearm pain  THERAPY DIAG:  Pain in right elbow  Pain in right forearm  Stiffness of right wrist, not elsewhere classified  Stiffness of right elbow, not elsewhere classified  Muscle weakness (generalized)  Rationale for Evaluation and Treatment: Rehabilitation  SUBJECTIVE:   SUBJECTIVE STATEMENT: My arm was hurting after last time- don't like the exercise with the heating pad with my arm out to the side with palm up -and then my wrist hurt this weekend.  But I was able to mow the lawn with less pain.  My pain is better. Pt accompanied by: self  PERTINENT HISTORY: [name] 08/30/23 Assessment:  Sheila Rivas walking her dog 3-4 months prior to appt  Order send 09/13/23  Encounter Diagnoses  Name Primary?  Acute pain of right shoulder  Neck pain  Right elbow pain Yes  Cervical spondylosis  Status post right rotator cuff repair  Rotator cuff tendinitis, right  Strain of right forearm, initial encounter  Sprain of right elbow, initial encounter   Plan:  The treatment options were discussed with the patient. In addition, patient educational materials were provided regarding the diagnosis and treatment options. Although the patient does have moderate degenerative changes of her shoulder, I do not feel that majority of her forearm symptoms are actually coming  from her shoulder. Furthermore, although she has significant degenerative changes of her neck, she has no pain with neck motion and no obvious nerve deficits to her right upper extremity. Therefore, I feel that the majority of her symptoms are coming from her elbow and forearm. The patient is quite frustrated by her symptoms and functional limitations, but is hesitant to consider more aggressive treatment options. Therefore I have recommended that she be referred to occupational therapy for range of motion and strength exercises as well as modalities to reduce inflammation. She may continue with her normal daily activities but is to avoid offending activities. She may take over-the-counter medications as needed for discomfort. All of the patient's questions and concerns were answered. She can call any time with further concerns. She will follow up on an as necessary basis per her request. This office visit took 60 minutes, of which >50% involved patient counseling/education.   PRECAUTIONS: None   WEIGHT BEARING RESTRICTIONS: No  PAIN:  Are you having pain?  Resting pain1/10 pain decreased to 3/10 with reaching  FALLS: Has patient fallen in last 6 months? No  LIVING ENVIRONMENT: Lives with: lives with their family  PLOF: Lives with family.  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  She works in the yard, Airline pilot.  Do some YouTube workouts and help with her 20-year-old grand daughter 40 lbs dog  PATIENT GOALS: I want the pain better my right forearm and elbow so that I can work in the yard, do things around the house, do my workout videos,    OBJECTIVE:  Note: Objective measures were completed at Evaluation unless otherwise noted.  HAND DOMINANCE: Right  ADLs: Increased pain with gripping and pulling and lifting and pushing and any sustained grip pain increases to 8/10   FUNCTIONAL OUTCOME MEASURES: To be done next session  UPPER EXTREMITY ROM:      Active ROM Right eval Left eval R 10/11/23 R 23/25  Shoulder flexion      Shoulder abduction      Shoulder adduction      Shoulder extension      Shoulder internal rotation      Shoulder external rotation      Elbow flexion      Elbow extension -20  0 0  Wrist flexion 78 loose fist  62  78 and 72 loose fist  80 elbow to side decrease ext arm   Wrist extension 58  60 70 decrease ext arm   Wrist ulnar deviation      Wrist radial deviation      Wrist pronation 90     Wrist supination 70  80 70  (Blank rows = not tested)  Active ROM Right eval Left eval  Thumb MCP (0-60)    Thumb IP (0-80)    Thumb Radial abd/add (0-55)     Thumb Palmar abd/add (0-45)     Thumb Opposition to Small  Finger     Index MCP (0-90)     Index PIP (0-100)     Index DIP (0-70)      Long MCP (0-90)      Long PIP (0-100)      Long DIP (0-70)      Ring MCP (0-90)      Ring PIP (0-100)      Ring DIP (0-70)      Little MCP (0-90)      Little PIP (0-100)      Little DIP (0-70)      Digits flexion and thumb range of motion within normal limits including opposition    HAND FUNCTION: Grip strength: Right: 35 extended arm 34  lbs; Left: 60 extended arm 56 lbs, Lateral pinch: Right: 9 lbs, Left: 12 lbs, and 3 point pinch: Right: 13 lbs, Left: 13 lbs Grip strength: Right: 51 extended arm 55  lbs; Left: 60 extended arm 56 lbs, Lateral pinch: Right: 9 lbs, Left: 12 lbs, and 3 point pinch: Right: 13 lbs, Left: 13 lbs  COORDINATION: Within functional limits  SENSATION: Denies any sensory issues  EDEMA: Will assess next session  COGNITION: Overall cognitive status: Within functional limits for tasks assessed      TREATMENT DATE: 10/14/23                                                                                                                           Patient arrived again with continued progress in range of motion as well as decreased pain with reaching and functional use. Wrist flexion  extension with elbow to side within functional limits including extension.  Change home exercises to extended arm.  But not into supination external rotation because of history of shoulder surgery.   See flowsheet.   Supination about the same  Modalities: Paraffin:  Time: 8 Location: Right elbow, forearm and wrist 2 minutes stretch for supination as well as at the elbow to side and wrist flexion with extended arm on table x 2 each for 2 minutes prior to soft tissue  Followed by manual therapy Graston tool over volar and dorsal forearm.  As well as myofascial release of the bicep volar elbow and forearm with a gentle flexion and extension stretch for wrist with extended arm as well supination with supination at side. Gentle traction with joint mobs at the wrist prior to wrist flexion and extension and supination stretch    Patient to continue to do supination contract relax for and position with elbow to side at home   10 reps. With supported arm wrist flexion as well as composite flexion 5 x 5 reps Prayer stretch 12 reps hold 5 seconds prior to As well as extended supported arm for wrist extension in neutral PROM 5 x 5 reps   Patient to stop doing hammer for supination  At composite nerve glide for patient when walking to the bathroom do 5 reps to incorporate elbow extension with wrist  extension and opening the pec   PATIENT EDUCATION: Education details: findings of eval and HEP  Person educated: Patient Education method: Explanation, Demonstration, Tactile cues, Verbal cues, and Handouts Education comprehension: verbalized understanding, returned demonstration, verbal cues required, and needs further education   GOALS: Goals reviewed with patient? Yes   LONG TERM GOALS: Target date: 8 wks  Patient to be independent in home program to increase elbow extension and supination by 10 to 20 degrees to hold small objects or medications in the palm and reach behind her  symptom-free Baseline: Elbow extension -20 degrees.  Coming in and supination 70 degrees.  With pain with passive range of motion at the elbow but also into the bicep. Goal status: INITIAL  2.  Right wrist flexion and extension range of motion improved to within normal limits for patient to be able to reach behind her back.  Hearing aids in and apply deodorant symptom-free Baseline: Wrist extension 58 degrees flexion open hand 78 loose wrist 62. Goal status: INITIAL  3.  Pain in right elbow and forearm improved to less than 2/10 with yard work as well as bathing and dressing with upper body and grooming activities Baseline: Pain increased to 8/10 over the forearm as well as elbow into the upper arm. Goal status: INITIAL  4.  Right grip strength improved by 5 to 10 pounds for patient to be able to mow the lawn and drive and perform gripping activities and ADLs and IADLs symptom-free. Baseline: Grip 35 pounds on the right and 60 on the left.  Patient has increased pain 8/10 with anything gripping or sustained gripping, mowing the lawn driving. Goal status: INITIAL  ASSESSMENT:  CLINICAL IMPRESSION: Patient seen for occupational therap for right forearm and elbow pain since had a traction injury rhe with walking her dog in January.  Patient had shoulder surgery about 4 years ago.  Per patient rotator cuff surgery.  At eval supination 70 degrees and elbow extension -20.  Patient report she was limited in this motion since shoulder surgery as well as had elbow pain after surgery.  Patient present at OT eval with decreased supination as well as wrist flexion extension as well as grip strength.  Patient pain can increase to 8/10 with repetitive or sustained gripping and lifting and pushing and pulling activities.  NOW patient arrives with pain decreased to 3/10 with reaching as well as mowing the lawn this past weekend.  Patient's wrist flexion extension with elbow to side within functional limits.   Patient can change home program to extended arm for stretches.  Patient to maintain supination with elbow to side.  Grip strength increase greatly with less pain.  Patient limited in functional use of right dominant hand in ADLs and IADLs.  Patient can benefit from skilled OT services to decrease pain increase motion and increase strength in right upper extremity to be able to be independent and symptom-free and ADLs and IADLs.  PERFORMANCE DEFICITS: in functional skills including ADLs, IADLs, ROM, strength, pain, flexibility, decreased knowledge of use of DME, and UE functional use,   and psychosocial skills including environmental adaptation and routines and behaviors.   IMPAIRMENTS: are limiting patient from ADLs, IADLs, rest and sleep, play, leisure, and social participation.   COMORBIDITIES: has no other co-morbidities that affects occupational performance. Patient will benefit from skilled OT to address above impairments and improve overall function.  MODIFICATION OR ASSISTANCE TO COMPLETE EVALUATION:  modification of tasks or assist necessary to complete an evaluation.  OT OCCUPATIONAL PROFILE AND HISTORY: Problem focused assessment: Including review of records relating to presenting problem.  Reviewed history of shoulder surgeries as well as recovery and outcomes.  CLINICAL DECISION MAKING: Moderate-had shoulder surgery in the past,  task modification necessary  REHAB POTENTIAL: Good for goals  EVALUATION COMPLEXITY: Moderate      PLAN:  OT FREQUENCY: 1-2x/week  OT DURATION: 8 weeks  PLANNED INTERVENTIONS: 97168 OT Re-evaluation, 97535 self care/ADL training, 02889 therapeutic exercise, 97530 therapeutic activity, 97112 neuromuscular re-education, 97140 manual therapy, 97035 ultrasound, 97018 paraffin, 02960 fluidotherapy, 97034 contrast bath, 97033 iontophoresis, passive range of motion, patient/family education, and DME and/or AE instructions    CONSULTED AND AGREED WITH  PLAN OF CARE: Patient     Ancel Peters, OTR/L,CLT 10/14/2023, 9:50 AM

## 2023-10-17 ENCOUNTER — Ambulatory Visit: Admitting: Occupational Therapy

## 2023-10-17 DIAGNOSIS — M25621 Stiffness of right elbow, not elsewhere classified: Secondary | ICD-10-CM

## 2023-10-17 DIAGNOSIS — M79631 Pain in right forearm: Secondary | ICD-10-CM | POA: Diagnosis not present

## 2023-10-17 DIAGNOSIS — M25521 Pain in right elbow: Secondary | ICD-10-CM

## 2023-10-17 DIAGNOSIS — M25631 Stiffness of right wrist, not elsewhere classified: Secondary | ICD-10-CM

## 2023-10-17 DIAGNOSIS — M6281 Muscle weakness (generalized): Secondary | ICD-10-CM

## 2023-10-17 NOTE — Therapy (Signed)
 OUTPATIENT OCCUPATIONAL THERAPY ORTHO TREATMENT  Patient Name: Sheila Rivas MRN: [id number] DOB:[birth year], 73 y.o., female Today's Date: 10/17/2023  PCP: Don NP REFERRING PROVIDER: [name]  END OF SESSION:  OT End of Session - 10/17/23 0950     Visit Number 4    Number of Visits 12    Date for OT Re-Evaluation 12/03/23    OT Start Time 0950    Activity Tolerance Patient tolerated treatment well    Behavior During Therapy [facility] for tasks assessed/performed          Past Medical History:  Diagnosis Date   Anemia    with pregnancy   Anxiety    Arthritis    osteo - hips, knees, lower back   Asthma    well controlled   Complication of anesthesia    Respirations dropped after shoulder surgery in 2013. spent the night   DDD (degenerative disc disease), lumbar    L4-L5   GERD (gastroesophageal reflux disease)    Hypertension    Motion sickness    [employer]   Osteoarthritis, hip, bilateral    Sleep apnea    no CPAP/ not tolerated   Past Surgical History:  Procedure Laterality Date   ANTERIOR LATERAL LUMBAR FUSION WITH PERCUTANEOUS SCREW 1 LEVEL N/A 03/01/2021   Procedure: L4-5 LATERAL INTERBODY FUSION, L4-5 POSTERIOR FUSION;  Surgeon: Clois Fret, MD;  Location: [facility];  Service: Neurosurgery;  Laterality: N/A;   COLONOSCOPY  2016   COLONOSCOPY WITH PROPOFOL  N/A 10/02/2021   Procedure: COLONOSCOPY WITH PROPOFOL ;  Surgeon: Maryruth Ole DASEN, MD;  Location: [facility];  Service: Endoscopy;  Laterality: N/A;   SHOULDER ARTHROSCOPY WITH ROTATOR CUFF REPAIR Right 2013   SHOULDER ARTHROSCOPY WITH SUBACROMIAL DECOMPRESSION Right 01/09/2019   TOTAL HIP ARTHROPLASTY Left 01/20/2020   Procedure: TOTAL HIP ARTHROPLASTY;  Surgeon: Mardee Lynwood SQUIBB, MD;  Location: [facility];  Service: Orthopedics;  Laterality: Left;   TOTAL HIP ARTHROPLASTY Right 08/01/2020   Procedure: TOTAL HIP ARTHROPLASTY;  Surgeon: Mardee Lynwood SQUIBB, MD;  Location: [facility];  Service:  Orthopedics;  Laterality: Right;   Patient Active Problem List   Diagnosis Date Noted   S/P lumbar fusion 03/01/2021   H/O total hip arthroplasty 08/01/2020   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   Hypertension 01/19/2020   Mild intermittent asthma 01/19/2020   Obesity (BMI 30-39.9) 01/19/2020   Chronic bilateral low back pain with left-sided sciatica 07/27/2019   Foraminal stenosis of lumbar region 07/27/2019   Osteoarthritis of hips, bilateral 10/21/2017   DDD (degenerative disc disease), lumbar 04/23/2017    ONSET DATE: Jan 25  REFERRING DIAG: R elbow and forearm pain  THERAPY DIAG:  Pain in right elbow  Pain in right forearm  Stiffness of right wrist, not elsewhere classified  Stiffness of right elbow, not elsewhere classified  Muscle weakness (generalized)  Rationale for Evaluation and Treatment: Rehabilitation  SUBJECTIVE:   SUBJECTIVE STATEMENT: My elbow pain is so much better.  I still catch myself not straightening my elbow but I cannot.  I am using it more with less pain.  The only thing that still hurts if if I closed the back of my car.   Pt accompanied by: self  PERTINENT HISTORY: [name] 08/30/23 Assessment:  Clemens walking her dog 3-4 months prior to appt  Order send 09/13/23  Encounter Diagnoses  Name Primary?  Acute pain of right shoulder  Neck pain  Right elbow pain Yes  Cervical spondylosis  Status  post right rotator cuff repair  Rotator cuff tendinitis, right  Strain of right forearm, initial encounter  Sprain of right elbow, initial encounter   Plan:  The treatment options were discussed with the patient. In addition, patient educational materials were provided regarding the diagnosis and treatment options. Although the patient does have moderate degenerative changes of her shoulder, I do not feel that majority of her forearm symptoms are actually coming from her shoulder. Furthermore, although she has significant degenerative  changes of her neck, she has no pain with neck motion and no obvious nerve deficits to her right upper extremity. Therefore, I feel that the majority of her symptoms are coming from her elbow and forearm. The patient is quite frustrated by her symptoms and functional limitations, but is hesitant to consider more aggressive treatment options. Therefore I have recommended that she be referred to occupational therapy for range of motion and strength exercises as well as modalities to reduce inflammation. She may continue with her normal daily activities but is to avoid offending activities. She may take over-the-counter medications as needed for discomfort. All of the patient's questions and concerns were answered. She can call any time with further concerns. She will follow up on an as necessary basis per her request. This office visit took 60 minutes, of which >50% involved patient counseling/education.   PRECAUTIONS: None   WEIGHT BEARING RESTRICTIONS: No  PAIN:  Are you having pain? 2/10 reaching close in the back of her car  FALLS: Has patient fallen in last 6 months? No  LIVING ENVIRONMENT: Lives with: lives with their family  PLOF: Lives with family.  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  She works in the yard, Airline pilot.  Do some YouTube workouts and help with her 51-year-old grand daughter 40 lbs dog  PATIENT GOALS: I want the pain better my right forearm and elbow so that I can work in the yard, do things around the house, do my workout videos,    OBJECTIVE:  Note: Objective measures were completed at Evaluation unless otherwise noted.  HAND DOMINANCE: Right  ADLs: Increased pain with gripping and pulling and lifting and pushing and any sustained grip pain increases to 8/10   FUNCTIONAL OUTCOME MEASURES: To be done next session  UPPER EXTREMITY ROM:     Active ROM Right eval Left eval R 10/11/23 R 10/14/23  Shoulder flexion       Shoulder abduction      Shoulder adduction      Shoulder extension      Shoulder internal rotation      Shoulder external rotation      Elbow flexion      Elbow extension -20  0 0  Wrist flexion 78 loose fist  62  78 and 72 loose fist  80 elbow to side decrease ext arm   Wrist extension 58  60 70 decrease ext arm   Wrist ulnar deviation      Wrist radial deviation      Wrist pronation 90     Wrist supination 70  80 70  (Blank rows = not tested)  Active ROM Right eval Left eval  Thumb MCP (0-60)    Thumb IP (0-80)    Thumb Radial abd/add (0-55)     Thumb Palmar abd/add (0-45)     Thumb Opposition to Small Finger     Index MCP (0-90)     Index PIP (0-100)  Index DIP (0-70)      Long MCP (0-90)      Long PIP (0-100)      Long DIP (0-70)      Ring MCP (0-90)      Ring PIP (0-100)      Ring DIP (0-70)      Little MCP (0-90)      Little PIP (0-100)      Little DIP (0-70)      Digits flexion and thumb range of motion within normal limits including opposition    HAND FUNCTION: Grip strength: Right: 35 extended arm 34  lbs; Left: 60 extended arm 56 lbs, Lateral pinch: Right: 9 lbs, Left: 12 lbs, and 3 point pinch: Right: 13 lbs, Left: 13 lbs Grip strength: Right: 51 extended arm 55  lbs; Left: 60 extended arm 56 lbs, Lateral pinch: Right: 9 lbs, Left: 12 lbs, and 3 point pinch: Right: 13 lbs, Left: 13 lbs 10/17/23 Grip strength: Right: 53 extended arm 56  lbs; Left: 60 extended arm 56 lbs, Lateral pinch: Right: 12 lbs, Left: 12 lbs, and 3 point pinch: Right: 14 lbs, Left: 13 lbs  COORDINATION: Within functional limits  SENSATION: Denies any sensory issues  EDEMA: Will assess next session  COGNITION: Overall cognitive status: Within functional limits for tasks assessed      TREATMENT DATE: 10/17/23                                                                                                                           Patient arrived again with continued progress  in range of motion as well as decreased pain with reaching and functional use. Wrist flexion/ extension with elbow extended improve .  Patient to continue with home exercises with extended arm support for composite wrist flexion.  Continue with prayer stretch prior to adding today and review table slides for composite wrist extension with initiation of some weightbearing.  20 reps tolerated well Supination same Patient able to push and pull back against resistance with OT with bilateral arms palm to palm.  No pain  Modalities: Paraffin:  Time: 8 Location: Right elbow, forearm and wrist 2 times 2 minutes stretch for supination with heating pad prior to soft tissue and joint mobs  Followed by manual therapy Graston tool over volar and dorsal forearm.  As well as myofascial release of the bicep volar elbow and forearm with a gentle flexion and extension stretch for wrist with extended arm  Gentle joint mobs to radius head with extended arm in neutral with grip.  10 reps   Prior to facilitation of supination using weighted ball 1 kg.  10 reps patient fatigued bicep weak. Followed by flex desk rolling 1 kg ball side-to-side using supination at elbow flexion at 90.  Fatigue over bicep with some pain at the radius. Changed to a soccer ball for patient to do at home pronation supination with active assisted range for endrange as well as a attempt  to hold ball on palm 5 x 5 seconds.   Reviewed with patient at yellow Thera-Band for elbow extension in neutral and supination 2 sets of 10 pain-free 2 times a day  At composite nerve glide for patient when walking to the bathroom do 5 reps to incorporate elbow extension with wrist extension and opening the pec   PATIENT EDUCATION: Education details: findings of eval and HEP  Person educated: Patient Education method: Explanation, Demonstration, Tactile cues, Verbal cues, and Handouts Education comprehension: verbalized understanding, returned  demonstration, verbal cues required, and needs further education   GOALS: Goals reviewed with patient? Yes   LONG TERM GOALS: Target date: 8 wks  Patient to be independent in home program to increase elbow extension and supination by 10 to 20 degrees to hold small objects or medications in the palm and reach behind her symptom-free Baseline: Elbow extension -20 degrees.  Coming in and supination 70 degrees.  With pain with passive range of motion at the elbow but also into the bicep. Goal status: INITIAL  2.  Right wrist flexion and extension range of motion improved to within normal limits for patient to be able to reach behind her back.  Hearing aids in and apply deodorant symptom-free Baseline: Wrist extension 58 degrees flexion open hand 78 loose wrist 62. Goal status: INITIAL  3.  Pain in right elbow and forearm improved to less than 2/10 with yard work as well as bathing and dressing with upper body and grooming activities Baseline: Pain increased to 8/10 over the forearm as well as elbow into the upper arm. Goal status: INITIAL  4.  Right grip strength improved by 5 to 10 pounds for patient to be able to mow the lawn and drive and perform gripping activities and ADLs and IADLs symptom-free. Baseline: Grip 35 pounds on the right and 60 on the left.  Patient has increased pain 8/10 with anything gripping or sustained gripping, mowing the lawn driving. Goal status: INITIAL  ASSESSMENT:  CLINICAL IMPRESSION: Patient seen for occupational therap for right forearm and elbow pain since had a traction injury rhe with walking her dog in January.  Patient had shoulder surgery about 4 years ago.  Per patient rotator cuff surgery.  At eval supination 70 degrees and elbow extension -20.  Patient report she was limited in this motion since shoulder surgery as well as had elbow pain after surgery.  Patient present at OT eval with decreased supination as well as wrist flexion extension as well as  grip strength.  Patient pain can increase to 8/10 with repetitive or sustained gripping and lifting and pushing and pulling activities.  NOW patient arrives with pain decreased to 2/10 only when really reaching and closing the back of her car door.  Patient's wrist flexion extension with elbow extended improved to about normal motion.  Patient to work on composite wrist flexion extension with extended arm.  Add table slides this date.  Done some joint mobs radius head in combination with the grip and focused on supination.  Add light strengthening yellow Thera-Band for elbow extension.  Patient tolerating well.  Grip and prehension strength continues to improve-  patient limited in functional use of right dominant hand in ADLs and IADLs.  Patient can benefit from skilled OT services to decrease pain increase motion and increase strength in right upper extremity to be able to be independent and symptom-free and ADLs and IADLs.  PERFORMANCE DEFICITS: in functional skills including ADLs, IADLs, ROM, strength, pain, flexibility,  decreased knowledge of use of DME, and UE functional use,   and psychosocial skills including environmental adaptation and routines and behaviors.   IMPAIRMENTS: are limiting patient from ADLs, IADLs, rest and sleep, play, leisure, and social participation.   COMORBIDITIES: has no other co-morbidities that affects occupational performance. Patient will benefit from skilled OT to address above impairments and improve overall function.  MODIFICATION OR ASSISTANCE TO COMPLETE EVALUATION:  modification of tasks or assist necessary to complete an evaluation.  OT OCCUPATIONAL PROFILE AND HISTORY: Problem focused assessment: Including review of records relating to presenting problem.  Reviewed history of shoulder surgeries as well as recovery and outcomes.  CLINICAL DECISION MAKING: Moderate-had shoulder surgery in the past,  task modification necessary  REHAB POTENTIAL: Good for  goals  EVALUATION COMPLEXITY: Moderate      PLAN:  OT FREQUENCY: 1-2x/week  OT DURATION: 8 weeks  PLANNED INTERVENTIONS: 97168 OT Re-evaluation, 97535 self care/ADL training, 02889 therapeutic exercise, 97530 therapeutic activity, 97112 neuromuscular re-education, 97140 manual therapy, 97035 ultrasound, 97018 paraffin, 02960 fluidotherapy, 97034 contrast bath, 97033 iontophoresis, passive range of motion, patient/family education, and DME and/or AE instructions    CONSULTED AND AGREED WITH PLAN OF CARE: Patient     Ancel Peters, OTR/L,CLT 10/17/2023, 9:51 AM

## 2023-10-21 ENCOUNTER — Ambulatory Visit: Admitting: Occupational Therapy

## 2023-10-21 DIAGNOSIS — M25621 Stiffness of right elbow, not elsewhere classified: Secondary | ICD-10-CM | POA: Diagnosis not present

## 2023-10-21 DIAGNOSIS — M6281 Muscle weakness (generalized): Secondary | ICD-10-CM

## 2023-10-21 DIAGNOSIS — M79631 Pain in right forearm: Secondary | ICD-10-CM

## 2023-10-21 DIAGNOSIS — M25631 Stiffness of right wrist, not elsewhere classified: Secondary | ICD-10-CM

## 2023-10-21 DIAGNOSIS — M25521 Pain in right elbow: Secondary | ICD-10-CM

## 2023-10-21 NOTE — Therapy (Signed)
 OUTPATIENT OCCUPATIONAL THERAPY ORTHO TREATMENT  Patient Name: Sheila Rivas MRN: [id number] DOB:1951/02/20, 73 y.o., female Today's Date: 10/21/2023  PCP: Don NP REFERRING PROVIDER: [name]  END OF SESSION:  OT End of Session - 10/21/23 0819     Visit Number 5    Number of Visits 12    Date for OT Re-Evaluation 12/03/23    OT Start Time 0819    OT Stop Time 0910    OT Time Calculation (min) 51 min    Activity Tolerance Patient tolerated treatment well    Behavior During Therapy [facility] for tasks assessed/performed          Past Medical History:  Diagnosis Date   Anemia    with pregnancy   Anxiety    Arthritis    osteo - hips, knees, lower back   Asthma    well controlled   Complication of anesthesia    Respirations dropped after shoulder surgery in 2013. spent the night   DDD (degenerative disc disease), lumbar    L4-L5   GERD (gastroesophageal reflux disease)    Hypertension    Motion sickness    [employer] theaters   Osteoarthritis, hip, bilateral    Sleep apnea    no CPAP/ not tolerated   Past Surgical History:  Procedure Laterality Date   ANTERIOR LATERAL LUMBAR FUSION WITH PERCUTANEOUS SCREW 1 LEVEL N/A 03/01/2021   Procedure: L4-5 LATERAL INTERBODY FUSION, L4-5 POSTERIOR FUSION;  Surgeon: Clois Fret, MD;  Location: [facility] ORS;  Service: Neurosurgery;  Laterality: N/A;   COLONOSCOPY  2016   COLONOSCOPY WITH PROPOFOL  N/A 10/02/2021   Procedure: COLONOSCOPY WITH PROPOFOL ;  Surgeon: Maryruth Ole DASEN, MD;  Location: [facility];  Service: Endoscopy;  Laterality: N/A;   SHOULDER ARTHROSCOPY WITH ROTATOR CUFF REPAIR Right 2013   SHOULDER ARTHROSCOPY WITH SUBACROMIAL DECOMPRESSION Right 01/09/2019   TOTAL HIP ARTHROPLASTY Left 01/20/2020   Procedure: TOTAL HIP ARTHROPLASTY;  Surgeon: Mardee Lynwood SQUIBB, MD;  Location: [facility] OR[facility];  Service: Orthopedics;  Laterality: Left;   TOTAL HIP ARTHROPLASTY Right 08/01/2020   Procedure: TOTAL HIP ARTHROPLASTY;  Surgeon:  Mardee Lynwood SQUIBB, MD;  Location: [facility] OR[facility];  Service: Orthopedics;  Laterality: Right;   Patient Active Problem List   Diagnosis Date Noted   S/P lumbar fusion 03/01/2021   H/O total hip arthroplasty 08/01/2020   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   Hypertension 01/19/2020   Mild intermittent asthma 01/19/2020   Obesity (BMI 30-39.9) 01/19/2020   Chronic bilateral low back pain with left-sided sciatica 07/27/2019   Foraminal stenosis of lumbar region 07/27/2019   Osteoarthritis of hips, bilateral 10/21/2017   DDD (degenerative disc disease), lumbar 04/23/2017    ONSET DATE: Jan 25  REFERRING DIAG: R elbow and forearm pain  THERAPY DIAG:  Pain in right elbow  Pain in right forearm  Stiffness of right wrist, not elsewhere classified  Stiffness of right elbow, not elsewhere classified  Muscle weakness (generalized)  Rationale for Evaluation and Treatment: Rehabilitation  SUBJECTIVE:   SUBJECTIVE STATEMENT: We done pizza party and I was sore  after making 30 pizza- but pain is better -  no pain with laundry , cooking and bathing/dressing Pt accompanied by: self  PERTINENT HISTORY: [name] 08/30/23 Assessment:  Clemens walking her dog 3-4 months prior to appt  Order send 09/13/23  Encounter Diagnoses  Name Primary?  Acute pain of right shoulder  Neck pain  Right elbow pain Yes  Cervical spondylosis  Status post right rotator  cuff repair  Rotator cuff tendinitis, right  Strain of right forearm, initial encounter  Sprain of right elbow, initial encounter   Plan:  The treatment options were discussed with the patient. In addition, patient educational materials were provided regarding the diagnosis and treatment options. Although the patient does have moderate degenerative changes of her shoulder, I do not feel that majority of her forearm symptoms are actually coming from her shoulder. Furthermore, although she has significant degenerative changes of her neck,  she has no pain with neck motion and no obvious nerve deficits to her right upper extremity. Therefore, I feel that the majority of her symptoms are coming from her elbow and forearm. The patient is quite frustrated by her symptoms and functional limitations, but is hesitant to consider more aggressive treatment options. Therefore I have recommended that she be referred to occupational therapy for range of motion and strength exercises as well as modalities to reduce inflammation. She may continue with her normal daily activities but is to avoid offending activities. She may take over-the-counter medications as needed for discomfort. All of the patient's questions and concerns were answered. She can call any time with further concerns. She will follow up on an as necessary basis per her request. This office visit took 60 minutes, of which >50% involved patient counseling/education.   PRECAUTIONS: None   WEIGHT BEARING RESTRICTIONS: No  PAIN:  Are you having pain? 2/10 reaching close in the back of her car  FALLS: Has patient fallen in last 6 months? No  LIVING ENVIRONMENT: Lives with: lives with their family  PLOF: Lives with family.  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  She works in the yard, Airline pilot.  Do some YouTube workouts and help with her 37-year-old grand daughter 40 lbs dog  PATIENT GOALS: I want the pain better my right forearm and elbow so that I can work in the yard, do things around the house, do my workout videos,    OBJECTIVE:  Note: Objective measures were completed at Evaluation unless otherwise noted.  HAND DOMINANCE: Right  ADLs: Increased pain with gripping and pulling and lifting and pushing and any sustained grip pain increases to 8/10   FUNCTIONAL OUTCOME MEASURES: To be done next session  UPPER EXTREMITY ROM:     Active ROM Right eval Left eval R 10/11/23 R 10/14/23  Shoulder flexion      Shoulder abduction       Shoulder adduction      Shoulder extension      Shoulder internal rotation      Shoulder external rotation      Elbow flexion      Elbow extension -20  0 0  Wrist flexion 78 loose fist  62  78 and 72 loose fist  80 elbow to side decrease ext arm   Wrist extension 58  60 70 decrease ext arm   Wrist ulnar deviation      Wrist radial deviation      Wrist pronation 90     Wrist supination 70  80 70  (Blank rows = not tested)  Active ROM Right eval Left eval  Thumb MCP (0-60)    Thumb IP (0-80)    Thumb Radial abd/add (0-55)     Thumb Palmar abd/add (0-45)     Thumb Opposition to Small Finger     Index MCP (0-90)     Index PIP (0-100)     Index DIP (  0-70)      Long MCP (0-90)      Long PIP (0-100)      Long DIP (0-70)      Ring MCP (0-90)      Ring PIP (0-100)      Ring DIP (0-70)      Little MCP (0-90)      Little PIP (0-100)      Little DIP (0-70)      Digits flexion and thumb range of motion within normal limits including opposition    HAND FUNCTION: Grip strength: Right: 35 extended arm 34  lbs; Left: 60 extended arm 56 lbs, Lateral pinch: Right: 9 lbs, Left: 12 lbs, and 3 point pinch: Right: 13 lbs, Left: 13 lbs Grip strength: Right: 51 extended arm 55  lbs; Left: 60 extended arm 56 lbs, Lateral pinch: Right: 9 lbs, Left: 12 lbs, and 3 point pinch: Right: 13 lbs, Left: 13 lbs 10/17/23 Grip strength: Right: 53 extended arm 56  lbs; Left: 60 extended arm 56 lbs, Lateral pinch: Right: 12 lbs, Left: 12 lbs, and 3 point pinch: Right: 14 lbs, Left: 13 lbs  COORDINATION: Within functional limits  SENSATION: Denies any sensory issues  EDEMA: Will assess next session  COGNITION: Overall cognitive status: Within functional limits for tasks assessed      TREATMENT DATE: 10/21/23                                                                                                                           Patient arrived again with continued progress in range of motion as  well as decreased pain with reaching and functional use. Wrist flexion/ extension with elbow extended improving .  Patient to continue with home exercises with extended arm support for composite wrist flexion.  Continue with prayer stretch prior table slides for composite wrist extension with initiation of some weightbearing.  20 reps tolerated well - need min A review Supination increase to 78-82 today Patient able to push and pull back against resistance with OT with bilateral arms palm to palm.  No pain  Modalities: Paraffin:  Time: 8 Location: Right elbow, forearm and wrist 2 times 2 minutes stretch wrist flexion ext arm -and ext by OT in neutral    Gentle joint mobs to radius head with extended arm in neutral with grip.  10 reps   Prior to facilitation of supination/pronation with basketball elbow to side 15 reps Patient do the same at home can gradually over the next week increase to incorporate shoulder flexion with elbow extension and supination if pain-free and back into elbow flexion supination. Followed by  using weighted ball 1 kg.  10 reps patient fatigued bicep weak. Followed by flex disk rolling 1 kg ball side-to-side using supination at elbow flexion at 90.  Fatigue over bicep with some pain at the radius.  As well as rolling ball clocks and onto clocks all of them 10 reps With active assisted range of  motion with basketball supination pronation with adding some elbow extension.  But increased discomfort over the bicep Patient to hold off can try next week at home.     Reviewed with patient at yellow Thera-Band for elbow extension in neutral and supination 2 sets of 10 pain-free 2 times a day At yellow band labs pull downs 12 reps pain-free stopping at elbow flexion 90 degrees. Can over the next week go further into extension of pain-free  At composite nerve glide for patient when walking to the bathroom do 5 reps to incorporate elbow extension with wrist extension and  opening the pec   PATIENT EDUCATION: Education details: findings of eval and HEP  Person educated: Patient Education method: Explanation, Demonstration, Tactile cues, Verbal cues, and Handouts Education comprehension: verbalized understanding, returned demonstration, verbal cues required, and needs further education   GOALS: Goals reviewed with patient? Yes   LONG TERM GOALS: Target date: 8 wks  Patient to be independent in home program to increase elbow extension and supination by 10 to 20 degrees to hold small objects or medications in the palm and reach behind her symptom-free Baseline: Elbow extension -20 degrees.  Coming in and supination 70 degrees.  With pain with passive range of motion at the elbow but also into the bicep. Goal status: INITIAL  2.  Right wrist flexion and extension range of motion improved to within normal limits for patient to be able to reach behind her back.  Hearing aids in and apply deodorant symptom-free Baseline: Wrist extension 58 degrees flexion open hand 78 loose wrist 62. Goal status: INITIAL  3.  Pain in right elbow and forearm improved to less than 2/10 with yard work as well as bathing and dressing with upper body and grooming activities Baseline: Pain increased to 8/10 over the forearm as well as elbow into the upper arm. Goal status: INITIAL  4.  Right grip strength improved by 5 to 10 pounds for patient to be able to mow the lawn and drive and perform gripping activities and ADLs and IADLs symptom-free. Baseline: Grip 35 pounds on the right and 60 on the left.  Patient has increased pain 8/10 with anything gripping or sustained gripping, mowing the lawn driving. Goal status: INITIAL  ASSESSMENT:  CLINICAL IMPRESSION: Patient seen for occupational therap for right forearm and elbow pain since had a traction injury rhe with walking her dog in January.  Patient had shoulder surgery about 4 years ago.  Per patient rotator cuff surgery.  At eval  supination 70 degrees and elbow extension -20.  Patient report she was limited in this motion since shoulder surgery as well as had elbow pain after surgery.  Patient present at OT eval with decreased supination as well as wrist flexion extension as well as grip strength.  Patient pain can increase to 8/10 with repetitive or sustained gripping and lifting and pushing and pulling activities.  NOW patient making great progress in pain and elbow extension, wrist flexion extension and grip strength.  Arrives with pain decreased to 2/10 only when really reaching and closing the back of her car door.  Patient's wrist flexion extension with elbow extended improved to about normal motion.  Patient to work on composite wrist flexion extension with extended arm. Cont with table slides and can start wall slides if pain-free next week done some joint mobs radius head in combination with the grip and focused on supination.  Continue light strengthening yellow Thera-Band for elbow extension and at this date  LAT pull downs.  Patient tolerating well.  Grip and prehension strength continues to improve-  patient limited in functional use of right dominant hand in ADLs and IADLs.  Patient can benefit from skilled OT services to decrease pain increase motion and increase strength in right upper extremity to be able to be independent and symptom-free and ADLs and IADLs.  PERFORMANCE DEFICITS: in functional skills including ADLs, IADLs, ROM, strength, pain, flexibility, decreased knowledge of use of DME, and UE functional use,   and psychosocial skills including environmental adaptation and routines and behaviors.   IMPAIRMENTS: are limiting patient from ADLs, IADLs, rest and sleep, play, leisure, and social participation.   COMORBIDITIES: has no other co-morbidities that affects occupational performance. Patient will benefit from skilled OT to address above impairments and improve overall function.  MODIFICATION OR ASSISTANCE  TO COMPLETE EVALUATION:  modification of tasks or assist necessary to complete an evaluation.  OT OCCUPATIONAL PROFILE AND HISTORY: Problem focused assessment: Including review of records relating to presenting problem.  Reviewed history of shoulder surgeries as well as recovery and outcomes.  CLINICAL DECISION MAKING: Moderate-had shoulder surgery in the past,  task modification necessary  REHAB POTENTIAL: Good for goals  EVALUATION COMPLEXITY: Moderate      PLAN:  OT FREQUENCY: 1-2x/week  OT DURATION: 8 weeks  PLANNED INTERVENTIONS: 97168 OT Re-evaluation, 97535 self care/ADL training, 02889 therapeutic exercise, 97530 therapeutic activity, 97112 neuromuscular re-education, 97140 manual therapy, 97035 ultrasound, 97018 paraffin, 02960 fluidotherapy, 97034 contrast bath, 97033 iontophoresis, passive range of motion, patient/family education, and DME and/or AE instructions    CONSULTED AND AGREED WITH PLAN OF CARE: Patient     Ancel Peters, OTR/L,CLT 10/21/2023, 11:19 AM

## 2023-10-22 DIAGNOSIS — Z1231 Encounter for screening mammogram for malignant neoplasm of breast: Secondary | ICD-10-CM | POA: Diagnosis not present

## 2023-11-05 ENCOUNTER — Encounter: Admitting: Occupational Therapy

## 2023-11-08 ENCOUNTER — Ambulatory Visit: Attending: Surgery | Admitting: Occupational Therapy

## 2023-11-08 DIAGNOSIS — M25621 Stiffness of right elbow, not elsewhere classified: Secondary | ICD-10-CM | POA: Diagnosis not present

## 2023-11-08 DIAGNOSIS — M79631 Pain in right forearm: Secondary | ICD-10-CM | POA: Insufficient documentation

## 2023-11-08 DIAGNOSIS — M6281 Muscle weakness (generalized): Secondary | ICD-10-CM | POA: Diagnosis not present

## 2023-11-08 DIAGNOSIS — M25521 Pain in right elbow: Secondary | ICD-10-CM | POA: Insufficient documentation

## 2023-11-08 DIAGNOSIS — M25631 Stiffness of right wrist, not elsewhere classified: Secondary | ICD-10-CM | POA: Diagnosis not present

## 2023-11-08 NOTE — Therapy (Signed)
 OUTPATIENT OCCUPATIONAL THERAPY ORTHO TREATMENT  Patient Name: Sheila Rivas MRN: [id number] DOB:Mar 14, 1951, 73 y.o., female Today's Date: 11/08/2023  PCP: Don NP REFERRING PROVIDER: [name]  END OF SESSION:  OT End of Session - 11/08/23 0906     Visit Number 6    Number of Visits 12    Date for OT Re-Evaluation 12/03/23    OT Start Time 0906    OT Stop Time 0950    OT Time Calculation (min) 44 min    Activity Tolerance Patient tolerated treatment well    Behavior During Therapy [facility] for tasks assessed/performed          Past Medical History:  Diagnosis Date   Anemia    with pregnancy   Anxiety    Arthritis    osteo - hips, knees, lower back   Asthma    well controlled   Complication of anesthesia    Respirations dropped after shoulder surgery in 2013. spent the night   DDD (degenerative disc disease), lumbar    L4-L5   GERD (gastroesophageal reflux disease)    Hypertension    Motion sickness    [employer] theaters   Osteoarthritis, hip, bilateral    Sleep apnea    no CPAP/ not tolerated   Past Surgical History:  Procedure Laterality Date   ANTERIOR LATERAL LUMBAR FUSION WITH PERCUTANEOUS SCREW 1 LEVEL N/A 03/01/2021   Procedure: L4-5 LATERAL INTERBODY FUSION, L4-5 POSTERIOR FUSION;  Surgeon: Clois Fret, MD;  Location: [facility];  Service: Neurosurgery;  Laterality: N/A;   COLONOSCOPY  2016   COLONOSCOPY WITH PROPOFOL  N/A 10/02/2021   Procedure: COLONOSCOPY WITH PROPOFOL ;  Surgeon: Maryruth Ole DASEN, MD;  Location: [facility];  Service: Endoscopy;  Laterality: N/A;   SHOULDER ARTHROSCOPY WITH ROTATOR CUFF REPAIR Right 2013   SHOULDER ARTHROSCOPY WITH SUBACROMIAL DECOMPRESSION Right 01/09/2019   TOTAL HIP ARTHROPLASTY Left 01/20/2020   Procedure: TOTAL HIP ARTHROPLASTY;  Surgeon: Mardee Lynwood SQUIBB, MD;  Location: [facility];  Service: Orthopedics;  Laterality: Left;   TOTAL HIP ARTHROPLASTY Right 08/01/2020   Procedure: TOTAL HIP ARTHROPLASTY;  Surgeon:  Mardee Lynwood SQUIBB, MD;  Location: [facility];  Service: Orthopedics;  Laterality: Right;   Patient Active Problem List   Diagnosis Date Noted   S/P lumbar fusion 03/01/2021   H/O total hip arthroplasty 08/01/2020   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   Hypertension 01/19/2020   Mild intermittent asthma 01/19/2020   Obesity (BMI 30-39.9) 01/19/2020   Chronic bilateral low back pain with left-sided sciatica 07/27/2019   Foraminal stenosis of lumbar region 07/27/2019   Osteoarthritis of hips, bilateral 10/21/2017   DDD (degenerative disc disease), lumbar 04/23/2017    ONSET DATE: Jan 25  REFERRING DIAG: R elbow and forearm pain  THERAPY DIAG:  Pain in right elbow  Pain in right forearm  Stiffness of right wrist, not elsewhere classified  Stiffness of right elbow, not elsewhere classified  Muscle weakness (generalized)  Rationale for Evaluation and Treatment: Rehabilitation  SUBJECTIVE:   SUBJECTIVE STATEMENT: Doing better.  I feel my elbow here and there.  Mostly when walking the dog.  But much better.  I wrapped the release around my wrist a few times.  Pt accompanied by: self  PERTINENT HISTORY: [name] 08/30/23 Assessment:  Clemens walking her dog 3-4 months prior to appt  Order send 09/13/23  Encounter Diagnoses  Name Primary?  Acute pain of right shoulder  Neck pain  Right elbow pain Yes  Cervical spondylosis  Status post right rotator cuff repair  Rotator cuff tendinitis, right  Strain of right forearm, initial encounter  Sprain of right elbow, initial encounter   Plan:  The treatment options were discussed with the patient. In addition, patient educational materials were provided regarding the diagnosis and treatment options. Although the patient does have moderate degenerative changes of her shoulder, I do not feel that majority of her forearm symptoms are actually coming from her shoulder. Furthermore, although she has significant degenerative  changes of her neck, she has no pain with neck motion and no obvious nerve deficits to her right upper extremity. Therefore, I feel that the majority of her symptoms are coming from her elbow and forearm. The patient is quite frustrated by her symptoms and functional limitations, but is hesitant to consider more aggressive treatment options. Therefore I have recommended that she be referred to occupational therapy for range of motion and strength exercises as well as modalities to reduce inflammation. She may continue with her normal daily activities but is to avoid offending activities. She may take over-the-counter medications as needed for discomfort. All of the patient's questions and concerns were answered. She can call any time with further concerns. She will follow up on an as necessary basis per her request. This office visit took 60 minutes, of which >50% involved patient counseling/education.   PRECAUTIONS: None   WEIGHT BEARING RESTRICTIONS: No  PAIN:  Are you having pain? 2/10 reaching close in the back of her car  FALLS: Has patient fallen in last 6 months? No  LIVING ENVIRONMENT: Lives with: lives with their family  PLOF: Lives with family.  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  She works in the yard, Airline pilot.  Do some YouTube workouts and help with her 18-year-old grand daughter 40 lbs dog  PATIENT GOALS: I want the pain better my right forearm and elbow so that I can work in the yard, do things around the house, do my workout videos,    OBJECTIVE:  Note: Objective measures were completed at Evaluation unless otherwise noted.  HAND DOMINANCE: Right  ADLs: Increased pain with gripping and pulling and lifting and pushing and any sustained grip pain increases to 8/10   FUNCTIONAL OUTCOME MEASURES: To be done next session  UPPER EXTREMITY ROM:     Active ROM Right eval Left eval R 10/11/23 R 10/14/23 R 11/08/23  Shoulder  flexion       Shoulder abduction       Shoulder adduction       Shoulder extension       Shoulder internal rotation       Shoulder external rotation       Elbow flexion     150  Elbow extension -20  0 0 0  Wrist flexion 78 loose fist  62  78 and 72 loose fist  80 elbow to side decrease ext arm  80  Wrist extension 58  60 70 decrease ext arm  70  Wrist ulnar deviation       Wrist radial deviation       Wrist pronation 90    90  Wrist supination 70  80 70 80  (Blank rows = not tested)  Active ROM Right eval Left eval  Thumb MCP (0-60)    Thumb IP (0-80)    Thumb Radial abd/add (0-55)     Thumb Palmar abd/add (0-45)     Thumb Opposition to Small Finger  Index MCP (0-90)     Index PIP (0-100)     Index DIP (0-70)      Long MCP (0-90)      Long PIP (0-100)      Long DIP (0-70)      Ring MCP (0-90)      Ring PIP (0-100)      Ring DIP (0-70)      Little MCP (0-90)      Little PIP (0-100)      Little DIP (0-70)      Digits flexion and thumb range of motion within normal limits including opposition    HAND FUNCTION: Grip strength: Right: 35 extended arm 34  lbs; Left: 60 extended arm 56 lbs, Lateral pinch: Right: 9 lbs, Left: 12 lbs, and 3 point pinch: Right: 13 lbs, Left: 13 lbs Grip strength: Right: 51 extended arm 55  lbs; Left: 60 extended arm 56 lbs, Lateral pinch: Right: 9 lbs, Left: 12 lbs, and 3 point pinch: Right: 13 lbs, Left: 13 lbs 10/17/23 Grip strength: Right: 53 extended arm 56  lbs; Left: 60 extended arm 56 lbs, Lateral pinch: Right: 12 lbs, Left: 12 lbs, and 3 point pinch: Right: 14 lbs, Left: 13 lbs  COORDINATION: Within functional limits  SENSATION: Denies any sensory issues  EDEMA: Will assess next session  COGNITION: Overall cognitive status: Within functional limits for tasks assessed      TREATMENT DATE: 11/08/23                                                                                                                           Patient  continues to show progress session to session. Observed patient reaching backwards with fully extended arm taking backpack on and off.  No pain. With elbow to side wrist flexion extension radial and ulnar deviation within normal limits.  Strength 5/5 pain-free Will assess next time with extended arm. Supination 80 degrees pronation 90. Assess patient's holding off leash walking dog.  Recommend for patient to put loop around the wrist 1 time running it through the thumb webspace and simulated walking her dog.  Pain-free. Patient was able to carry 8 pounds symptom-free Able to do 4 pounds to 6 pounds simulating pouring a drink. Abducting shoulder is more than 6 pounds.  With some discomfort at elbow and shoulder. Reviewed with patient using 5 pounds for  rowing as well as tricep extension. Reviewed with patient correct technique Patient was doing a YouTube video. Patient able to push and pull back against resistance with OT with bilateral arms palm to palm.  No pain Pushing door and pulling heavy door some discomfort with pushing at elbow.  1/10.  Modalities: Paraffin:  Time: 8 Location: Right elbow, forearm and wrist Supination stretch with elbow to side    Gentle passive range of motion with prolonged stretch 3 x 30 seconds for supination Followed by yellow Thera-Band through webspace rotated twice around forearm facilitating active assisted range supination  using left hand.  10 reps hold 3 seconds added to home exercises Followed by 3 pounds supported elbow to side supination 10 reps Add to home exercises for patient to do twice a day.  Recommend every 2 hours some gentle prolonged passive range of motion for supination 3 x 30 seconds.  Wall push-ups shoulder with shoulder height patient able to do 10 reps pain-free focusing on elbow extension.        PATIENT EDUCATION: Education details: findings of eval and HEP  Person educated: Patient Education method: Explanation,  Demonstration, Tactile cues, Verbal cues, and Handouts Education comprehension: verbalized understanding, returned demonstration, verbal cues required, and needs further education   GOALS: Goals reviewed with patient? Yes   LONG TERM GOALS: Target date: 8 wks  Patient to be independent in home program to increase elbow extension and supination by 10 to 20 degrees to hold small objects or medications in the palm and reach behind her symptom-free Baseline: Elbow extension -20 degrees.  Coming in and supination 70 degrees.  With pain with passive range of motion at the elbow but also into the bicep. Goal status: INITIAL  2.  Right wrist flexion and extension range of motion improved to within normal limits for patient to be able to reach behind her back.  Hearing aids in and apply deodorant symptom-free Baseline: Wrist extension 58 degrees flexion open hand 78 loose wrist 62. Goal status: INITIAL  3.  Pain in right elbow and forearm improved to less than 2/10 with yard work as well as bathing and dressing with upper body and grooming activities Baseline: Pain increased to 8/10 over the forearm as well as elbow into the upper arm. Goal status: INITIAL  4.  Right grip strength improved by 5 to 10 pounds for patient to be able to mow the lawn and drive and perform gripping activities and ADLs and IADLs symptom-free. Baseline: Grip 35 pounds on the right and 60 on the left.  Patient has increased pain 8/10 with anything gripping or sustained gripping, mowing the lawn driving. Goal status: INITIAL  ASSESSMENT:  CLINICAL IMPRESSION: Patient seen for occupational therap for right forearm and elbow pain since had a traction injury rhe with walking her dog in January.  Patient had shoulder surgery about 4 years ago.  Per patient rotator cuff surgery.  At eval supination 70 degrees and elbow extension -20.  Patient report she was limited in this motion since shoulder surgery as well as had elbow pain  after surgery.  Patient present at OT eval with decreased supination as well as wrist flexion extension as well as grip strength.  Patient pain can increase to 8/10 with repetitive or sustained gripping and lifting and pushing and pulling activities.  NOW patient made great progress from start of care and pain, elbow extension, wrist flexion extension as well as supination.  Grip improving.  Increased functional use with less pain.  Motion mostly limited now by supination.  As well as pushing and pulling for elbow flexion extension like walking her dog.  Patient limited in functional use of right dominant hand in ADLs and IADLs.  Patient can benefit from skilled OT services to decrease pain increase motion and increase strength in right upper extremity to be able to be independent and symptom-free and ADLs and IADLs.  PERFORMANCE DEFICITS: in functional skills including ADLs, IADLs, ROM, strength, pain, flexibility, decreased knowledge of use of DME, and UE functional use,   and psychosocial skills including environmental adaptation and  routines and behaviors.   IMPAIRMENTS: are limiting patient from ADLs, IADLs, rest and sleep, play, leisure, and social participation.   COMORBIDITIES: has no other co-morbidities that affects occupational performance. Patient will benefit from skilled OT to address above impairments and improve overall function.  MODIFICATION OR ASSISTANCE TO COMPLETE EVALUATION:  modification of tasks or assist necessary to complete an evaluation.  OT OCCUPATIONAL PROFILE AND HISTORY: Problem focused assessment: Including review of records relating to presenting problem.  Reviewed history of shoulder surgeries as well as recovery and outcomes.  CLINICAL DECISION MAKING: Moderate-had shoulder surgery in the past,  task modification necessary  REHAB POTENTIAL: Good for goals  EVALUATION COMPLEXITY: Moderate      PLAN:  OT FREQUENCY: 1-2x/week  OT DURATION: 8 weeks  PLANNED  INTERVENTIONS: 97168 OT Re-evaluation, 97535 self care/ADL training, 02889 therapeutic exercise, 97530 therapeutic activity, 97112 neuromuscular re-education, 97140 manual therapy, 97035 ultrasound, 97018 paraffin, 02960 fluidotherapy, 97034 contrast bath, 97033 iontophoresis, passive range of motion, patient/family education, and DME and/or AE instructions    CONSULTED AND AGREED WITH PLAN OF CARE: Patient     Ancel Peters, OTR/L,CLT 11/08/2023, 1:22 PM

## 2023-11-11 ENCOUNTER — Ambulatory Visit: Admitting: Occupational Therapy

## 2023-11-11 ENCOUNTER — Encounter: Admitting: Occupational Therapy

## 2023-11-19 ENCOUNTER — Ambulatory Visit: Admitting: Occupational Therapy

## 2023-11-19 DIAGNOSIS — M25621 Stiffness of right elbow, not elsewhere classified: Secondary | ICD-10-CM

## 2023-11-19 DIAGNOSIS — M6281 Muscle weakness (generalized): Secondary | ICD-10-CM

## 2023-11-19 DIAGNOSIS — M25521 Pain in right elbow: Secondary | ICD-10-CM

## 2023-11-19 DIAGNOSIS — M25631 Stiffness of right wrist, not elsewhere classified: Secondary | ICD-10-CM

## 2023-11-19 DIAGNOSIS — M79631 Pain in right forearm: Secondary | ICD-10-CM | POA: Diagnosis not present

## 2023-11-19 NOTE — Therapy (Signed)
 OUTPATIENT OCCUPATIONAL THERAPY ORTHO TREATMENT  Patient Name: Sheila Rivas MRN: [id number] DOB:1950/08/25, 73 y.o., female Today's Date: 11/19/2023  PCP: Don NP REFERRING PROVIDER: [name]  END OF SESSION:  OT End of Session - 11/19/23 1037     Visit Number 7    Number of Visits 12    Date for OT Re-Evaluation 12/03/23    OT Start Time 1037    OT Stop Time 1120    OT Time Calculation (min) 43 min    Activity Tolerance Patient tolerated treatment well    Behavior During Therapy WFL for tasks assessed/performed          Past Medical History:  Diagnosis Date   Anemia    with pregnancy   Anxiety    Arthritis    osteo - hips, knees, lower back   Asthma    well controlled   Complication of anesthesia    Respirations dropped after shoulder surgery in 2013. spent the night   DDD (degenerative disc disease), lumbar    L4-L5   GERD (gastroesophageal reflux disease)    Hypertension    Motion sickness    [employer] theaters   Osteoarthritis, hip, bilateral    Sleep apnea    no CPAP/ not tolerated   Past Surgical History:  Procedure Laterality Date   ANTERIOR LATERAL LUMBAR FUSION WITH PERCUTANEOUS SCREW 1 LEVEL N/A 03/01/2021   Procedure: L4-5 LATERAL INTERBODY FUSION, L4-5 POSTERIOR FUSION;  Surgeon: Clois Fret, MD;  Location: [facility];  Service: Neurosurgery;  Laterality: N/A;   COLONOSCOPY  2016   COLONOSCOPY WITH PROPOFOL  N/A 10/02/2021   Procedure: COLONOSCOPY WITH PROPOFOL ;  Surgeon: Maryruth Ole DASEN, MD;  Location: [facility];  Service: Endoscopy;  Laterality: N/A;   SHOULDER ARTHROSCOPY WITH ROTATOR CUFF REPAIR Right 2013   SHOULDER ARTHROSCOPY WITH SUBACROMIAL DECOMPRESSION Right 01/09/2019   TOTAL HIP ARTHROPLASTY Left 01/20/2020   Procedure: TOTAL HIP ARTHROPLASTY;  Surgeon: Mardee Lynwood SQUIBB, MD;  Location: [facility];  Service: Orthopedics;  Laterality: Left;   TOTAL HIP ARTHROPLASTY Right 08/01/2020   Procedure: TOTAL HIP ARTHROPLASTY;  Surgeon:  Mardee Lynwood SQUIBB, MD;  Location: [facility];  Service: Orthopedics;  Laterality: Right;   Patient Active Problem List   Diagnosis Date Noted   S/P lumbar fusion 03/01/2021   H/O total hip arthroplasty 08/01/2020   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   Hypertension 01/19/2020   Mild intermittent asthma 01/19/2020   Obesity (BMI 30-39.9) 01/19/2020   Chronic bilateral low back pain with left-sided sciatica 07/27/2019   Foraminal stenosis of lumbar region 07/27/2019   Osteoarthritis of hips, bilateral 10/21/2017   DDD (degenerative disc disease), lumbar 04/23/2017    ONSET DATE: Jan 25  REFERRING DIAG: R elbow and forearm pain  THERAPY DIAG:  Pain in right elbow  Pain in right forearm  Stiffness of right wrist, not elsewhere classified  Stiffness of right elbow, not elsewhere classified  Muscle weakness (generalized)  Rationale for Evaluation and Treatment: Rehabilitation  SUBJECTIVE:   SUBJECTIVE STATEMENT: I feel like I am about where I was before I hurt my arm.  I feel like most of my trouble is at  my elbow.  I can like wash my hair behind my head now that I could not do since shoulder surgery and then my elbow can straighten now .  I do not feel pain really except with twisting the palm up and down.  But it is getting better.  Pt accompanied by: self  PERTINENT HISTORY: [name] 08/30/23 Assessment:  Clemens walking her dog 3-4 months prior to appt  Order send 09/13/23  Encounter Diagnoses  Name Primary?  Acute pain of right shoulder  Neck pain  Right elbow pain Yes  Cervical spondylosis  Status post right rotator cuff repair  Rotator cuff tendinitis, right  Strain of right forearm, initial encounter  Sprain of right elbow, initial encounter   Plan:  The treatment options were discussed with the patient. In addition, patient educational materials were provided regarding the diagnosis and treatment options. Although the patient does have moderate  degenerative changes of her shoulder, I do not feel that majority of her forearm symptoms are actually coming from her shoulder. Furthermore, although she has significant degenerative changes of her neck, she has no pain with neck motion and no obvious nerve deficits to her right upper extremity. Therefore, I feel that the majority of her symptoms are coming from her elbow and forearm. The patient is quite frustrated by her symptoms and functional limitations, but is hesitant to consider more aggressive treatment options. Therefore I have recommended that she be referred to occupational therapy for range of motion and strength exercises as well as modalities to reduce inflammation. She may continue with her normal daily activities but is to avoid offending activities. She may take over-the-counter medications as needed for discomfort. All of the patient's questions and concerns were answered. She can call any time with further concerns. She will follow up on an as necessary basis per her request. This office visit took 60 minutes, of which >50% involved patient counseling/education.   PRECAUTIONS: None   WEIGHT BEARING RESTRICTIONS: No  PAIN:  Are you having pain? 4/10 PROM supination  FALLS: Has patient fallen in last 6 months? No  LIVING ENVIRONMENT: Lives with: lives with their family  PLOF: Lives with family.  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  She works in the yard, Airline pilot.  Do some YouTube workouts and help with her 86-year-old grand daughter 40 lbs dog  PATIENT GOALS: I want the pain better my right forearm and elbow so that I can work in the yard, do things around the house, do my workout videos,    OBJECTIVE:  Note: Objective measures were completed at Evaluation unless otherwise noted.  HAND DOMINANCE: Right  ADLs: Increased pain with gripping and pulling and lifting and pushing and any sustained grip pain increases to  8/10   FUNCTIONAL OUTCOME MEASURES: To be done next session  UPPER EXTREMITY ROM:     Active ROM Right eval Left eval R 10/11/23 R 10/14/23 R 11/08/23  Shoulder flexion       Shoulder abduction       Shoulder adduction       Shoulder extension       Shoulder internal rotation       Shoulder external rotation       Elbow flexion     150  Elbow extension -20  0 0 0  Wrist flexion 78 loose fist  62  78 and 72 loose fist  80 elbow to side decrease ext arm  80  Wrist extension 58  60 70 decrease ext arm  70  Wrist ulnar deviation       Wrist radial deviation       Wrist pronation 90    90  Wrist supination 70  80 70 80  (Blank rows = not tested)  Active ROM  Right eval Left eval  Thumb MCP (0-60)    Thumb IP (0-80)    Thumb Radial abd/add (0-55)     Thumb Palmar abd/add (0-45)     Thumb Opposition to Small Finger     Index MCP (0-90)     Index PIP (0-100)     Index DIP (0-70)      Long MCP (0-90)      Long PIP (0-100)      Long DIP (0-70)      Ring MCP (0-90)      Ring PIP (0-100)      Ring DIP (0-70)      Little MCP (0-90)      Little PIP (0-100)      Little DIP (0-70)      Digits flexion and thumb range of motion within normal limits including opposition    HAND FUNCTION: Grip strength: Right: 35 extended arm 34  lbs; Left: 60 extended arm 56 lbs, Lateral pinch: Right: 9 lbs, Left: 12 lbs, and 3 point pinch: Right: 13 lbs, Left: 13 lbs Grip strength: Right: 51 extended arm 55  lbs; Left: 60 extended arm 56 lbs, Lateral pinch: Right: 9 lbs, Left: 12 lbs, and 3 point pinch: Right: 13 lbs, Left: 13 lbs 10/17/23 Grip strength: Right: 53 extended arm 56  lbs; Left: 60 extended arm 56 lbs, Lateral pinch: Right: 12 lbs, Left: 12 lbs, and 3 point pinch: Right: 14 lbs, Left: 13 lbs 11/19/23 Grip strength: Right: 58 extended arm 59  lbs; Left: 60 extended arm 56 lbs, Lateral pinch: Right: 12 lbs, Left: 12 lbs, and 3 point pinch: Right: 14 lbs, Left: 13  lbs COORDINATION: Within functional limits  SENSATION: Denies any sensory issues  EDEMA: Will assess next session  COGNITION: Overall cognitive status: Within functional limits for tasks assessed      TREATMENT DATE: 11/19/23                                                                                                                             Patient continues to be mostly limited by supination as well as composite wrist flexion with extended arm. Supination 80 degrees pronation 90. Patient reports no trouble or pain with walking the dog with a leash like reviewed last time.   Last visit patient was able to carry 8 pounds symptom-free Able to do 4 pounds to 6 pounds simulating pouring a drink. Abducting shoulder is more than 6 pounds.  With some discomfort at elbow and shoulder. PROM for supination with elbow flexed 4/10 Modalities: Paraffin:  Time: 8 Location: Right elbow, forearm and wrist Supination stretch with elbow flexed 3 x 2 minutes    Soft tissue massage and mobilization to volar and dorsal forearm with Graston tool #4 for sweeping as well as myofascial release to radial forearm in combination with pronation supination  Joint mobilization done to radius head with elbow extended squeezing 1 pound weight with joint  mobilization 10 reps prior to   PROM for elbow extended forearm extensors with wrist flexion 5 reps 5 seconds at home exercises PROM supination and pronation with elbow flexed at 145 degrees with facilitation and maintaining supination into elbow flexion at 90 degrees 20 reps at home exercises Followed by passive range of motion and active assisted range of motion for supination with elbow flexed at 90 degrees at home exercise Followed by 2 pound cuff weight around the hand for supination pronation supported 10 reps Patient reports fatigue more than pain. Pain decreased to 1-2/10 with supination Add to home exercises.        PATIENT  EDUCATION: Education details: findings of eval and HEP  Person educated: Patient Education method: Explanation, Demonstration, Tactile cues, Verbal cues, and Handouts Education comprehension: verbalized understanding, returned demonstration, verbal cues required, and needs further education   GOALS: Goals reviewed with patient? Yes   LONG TERM GOALS: Target date: 8 wks  Patient to be independent in home program to increase elbow extension and supination by 10 to 20 degrees to hold small objects or medications in the palm and reach behind her symptom-free Baseline: Elbow extension -20 degrees.  Coming in and supination 70 degrees.  With pain with passive range of motion at the elbow but also into the bicep. Goal status: INITIAL  2.  Right wrist flexion and extension range of motion improved to within normal limits for patient to be able to reach behind her back.  Hearing aids in and apply deodorant symptom-free Baseline: Wrist extension 58 degrees flexion open hand 78 loose wrist 62. Goal status: INITIAL  3.  Pain in right elbow and forearm improved to less than 2/10 with yard work as well as bathing and dressing with upper body and grooming activities Baseline: Pain increased to 8/10 over the forearm as well as elbow into the upper arm. Goal status: INITIAL  4.  Right grip strength improved by 5 to 10 pounds for patient to be able to mow the lawn and drive and perform gripping activities and ADLs and IADLs symptom-free. Baseline: Grip 35 pounds on the right and 60 on the left.  Patient has increased pain 8/10 with anything gripping or sustained gripping, mowing the lawn driving. Goal status: INITIAL  ASSESSMENT:  CLINICAL IMPRESSION: Patient seen for occupational therap for right forearm and elbow pain since had a traction injury rhe with walking her dog in January.  Patient had shoulder surgery about 4 years ago.  Per patient rotator cuff surgery.  At eval supination 70 degrees and  elbow extension -20.  Patient report she was limited in this motion since shoulder surgery as well as had elbow pain after surgery.  Patient present at OT eval with decreased supination as well as wrist flexion extension as well as grip strength.  Patient pain can increase to 8/10 with repetitive or sustained gripping and lifting and pushing and pulling activities.  NOW patient made great progress from start of care and pain, elbow extension, wrist flexion extension as well as supination.  Grip improving.  Increased functional use with less pain.  Motion mostly limited now by supination and wrist flexion with extended elbow.  Patient able to do pushing and pulling for elbow extension and leg like walking her dog less pain.  Patient limited in functional use of right dominant hand in ADLs and IADLs.  Patient can benefit from skilled OT services to decrease pain increase motion and increase strength in right upper extremity to be  able to be independent and symptom-free and ADLs and IADLs.  PERFORMANCE DEFICITS: in functional skills including ADLs, IADLs, ROM, strength, pain, flexibility, decreased knowledge of use of DME, and UE functional use,   and psychosocial skills including environmental adaptation and routines and behaviors.   IMPAIRMENTS: are limiting patient from ADLs, IADLs, rest and sleep, play, leisure, and social participation.   COMORBIDITIES: has no other co-morbidities that affects occupational performance. Patient will benefit from skilled OT to address above impairments and improve overall function.  MODIFICATION OR ASSISTANCE TO COMPLETE EVALUATION:  modification of tasks or assist necessary to complete an evaluation.  OT OCCUPATIONAL PROFILE AND HISTORY: Problem focused assessment: Including review of records relating to presenting problem.  Reviewed history of shoulder surgeries as well as recovery and outcomes.  CLINICAL DECISION MAKING: Moderate-had shoulder surgery in the past,   task modification necessary  REHAB POTENTIAL: Good for goals  EVALUATION COMPLEXITY: Moderate      PLAN:  OT FREQUENCY: 1-2x/week  OT DURATION: 8 weeks  PLANNED INTERVENTIONS: 97168 OT Re-evaluation, 97535 self care/ADL training, 02889 therapeutic exercise, 97530 therapeutic activity, 97112 neuromuscular re-education, 97140 manual therapy, 97035 ultrasound, 97018 paraffin, 02960 fluidotherapy, 97034 contrast bath, 97033 iontophoresis, passive range of motion, patient/family education, and DME and/or AE instructions    CONSULTED AND AGREED WITH PLAN OF CARE: Patient     Ancel Peters, OTR/L,CLT 11/19/2023, 5:07 PM

## 2023-11-26 ENCOUNTER — Ambulatory Visit: Attending: Surgery | Admitting: Occupational Therapy

## 2023-11-26 DIAGNOSIS — M25621 Stiffness of right elbow, not elsewhere classified: Secondary | ICD-10-CM | POA: Diagnosis not present

## 2023-11-26 DIAGNOSIS — M25631 Stiffness of right wrist, not elsewhere classified: Secondary | ICD-10-CM | POA: Diagnosis not present

## 2023-11-26 DIAGNOSIS — M79631 Pain in right forearm: Secondary | ICD-10-CM | POA: Diagnosis not present

## 2023-11-26 DIAGNOSIS — M6281 Muscle weakness (generalized): Secondary | ICD-10-CM | POA: Diagnosis not present

## 2023-11-26 DIAGNOSIS — M25521 Pain in right elbow: Secondary | ICD-10-CM | POA: Diagnosis not present

## 2023-11-26 NOTE — Therapy (Signed)
 OUTPATIENT OCCUPATIONAL THERAPY ORTHO TREATMENT  Patient Name: Sheila Rivas MRN: [id number] DOB:14-Mar-1951, 73 y.o., female Today's Date: 11/26/2023  PCP: Don NP REFERRING PROVIDER: [name]  END OF SESSION:  OT End of Session - 11/26/23 1110     Visit Number 8    Number of Visits 12    Date for OT Re-Evaluation 12/03/23    OT Start Time 0952    OT Stop Time 1030    OT Time Calculation (min) 38 min    Activity Tolerance Patient tolerated treatment well    Behavior During Therapy [facility] for tasks assessed/performed          Past Medical History:  Diagnosis Date   Anemia    with pregnancy   Anxiety    Arthritis    osteo - hips, knees, lower back   Asthma    well controlled   Complication of anesthesia    Respirations dropped after shoulder surgery in 2013. spent the night   DDD (degenerative disc disease), lumbar    L4-L5   GERD (gastroesophageal reflux disease)    Hypertension    Motion sickness    [employer] theaters   Osteoarthritis, hip, bilateral    Sleep apnea    no CPAP/ not tolerated   Past Surgical History:  Procedure Laterality Date   ANTERIOR LATERAL LUMBAR FUSION WITH PERCUTANEOUS SCREW 1 LEVEL N/A 03/01/2021   Procedure: L4-5 LATERAL INTERBODY FUSION, L4-5 POSTERIOR FUSION;  Surgeon: Clois Fret, MD;  Location: [facility];  Service: Neurosurgery;  Laterality: N/A;   COLONOSCOPY  2016   COLONOSCOPY WITH PROPOFOL  N/A 10/02/2021   Procedure: COLONOSCOPY WITH PROPOFOL ;  Surgeon: Maryruth Ole DASEN, MD;  Location: [facility];  Service: Endoscopy;  Laterality: N/A;   SHOULDER ARTHROSCOPY WITH ROTATOR CUFF REPAIR Right 2013   SHOULDER ARTHROSCOPY WITH SUBACROMIAL DECOMPRESSION Right 01/09/2019   TOTAL HIP ARTHROPLASTY Left 01/20/2020   Procedure: TOTAL HIP ARTHROPLASTY;  Surgeon: Mardee Lynwood SQUIBB, MD;  Location: [facility] OR[facility];  Service: Orthopedics;  Laterality: Left;   TOTAL HIP ARTHROPLASTY Right 08/01/2020   Procedure: TOTAL HIP ARTHROPLASTY;  Surgeon:  Mardee Lynwood SQUIBB, MD;  Location: [facility];  Service: Orthopedics;  Laterality: Right;   Patient Active Problem List   Diagnosis Date Noted   S/P lumbar fusion 03/01/2021   H/O total hip arthroplasty 08/01/2020   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   Hypertension 01/19/2020   Mild intermittent asthma 01/19/2020   Obesity (BMI 30-39.9) 01/19/2020   Chronic bilateral low back pain with left-sided sciatica 07/27/2019   Foraminal stenosis of lumbar region 07/27/2019   Osteoarthritis of hips, bilateral 10/21/2017   DDD (degenerative disc disease), lumbar 04/23/2017    ONSET DATE: Jan 25  REFERRING DIAG: R elbow and forearm pain  THERAPY DIAG:  Pain in right elbow  Pain in right forearm  Stiffness of right wrist, not elsewhere classified  Stiffness of right elbow, not elsewhere classified  Muscle weakness (generalized)  Rationale for Evaluation and Treatment: Rehabilitation  SUBJECTIVE:   SUBJECTIVE STATEMENT: I feel like I am about where I was before I hurt my arm.  I feel like most of my trouble is at  my elbow.  I can like wash my hair behind my head now that I could not do since shoulder surgery and then my elbow can straighten now .  I do not feel pain really except with twisting the palm up and down.  But it is getting better.  Pt accompanied by: self  PERTINENT HISTORY: [name] 08/30/23 Assessment:  Clemens walking her dog 3-4 months prior to appt  Order send 09/13/23  Encounter Diagnoses  Name Primary?  Acute pain of right shoulder  Neck pain  Right elbow pain Yes  Cervical spondylosis  Status post right rotator cuff repair  Rotator cuff tendinitis, right  Strain of right forearm, initial encounter  Sprain of right elbow, initial encounter   Plan:  The treatment options were discussed with the patient. In addition, patient educational materials were provided regarding the diagnosis and treatment options. Although the patient does have moderate  degenerative changes of her shoulder, I do not feel that majority of her forearm symptoms are actually coming from her shoulder. Furthermore, although she has significant degenerative changes of her neck, she has no pain with neck motion and no obvious nerve deficits to her right upper extremity. Therefore, I feel that the majority of her symptoms are coming from her elbow and forearm. The patient is quite frustrated by her symptoms and functional limitations, but is hesitant to consider more aggressive treatment options. Therefore I have recommended that she be referred to occupational therapy for range of motion and strength exercises as well as modalities to reduce inflammation. She may continue with her normal daily activities but is to avoid offending activities. She may take over-the-counter medications as needed for discomfort. All of the patient's questions and concerns were answered. She can call any time with further concerns. She will follow up on an as necessary basis per her request. This office visit took 60 minutes, of which >50% involved patient counseling/education.   PRECAUTIONS: None   WEIGHT BEARING RESTRICTIONS: No  PAIN:  Are you having pain? 4/10 PROM supination  FALLS: Has patient fallen in last 6 months? No  LIVING ENVIRONMENT: Lives with: lives with their family  PLOF: Lives with family.  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  She works in the yard, Airline pilot.  Do some YouTube workouts and help with her 73-year-old grand daughter 40 lbs dog  PATIENT GOALS: I want the pain better my right forearm and elbow so that I can work in the yard, do things around the house, do my workout videos,    OBJECTIVE:  Note: Objective measures were completed at Evaluation unless otherwise noted.  HAND DOMINANCE: Right  ADLs: Increased pain with gripping and pulling and lifting and pushing and any sustained grip pain increases to  8/10   FUNCTIONAL OUTCOME MEASURES: To be done next session  UPPER EXTREMITY ROM:     Active ROM Right eval Left eval R 10/11/23 R 10/14/23 R 11/08/23 R 11/26/23  Shoulder flexion        Shoulder abduction        Shoulder adduction        Shoulder extension        Shoulder internal rotation        Shoulder external rotation        Elbow flexion     150   Elbow extension -20  0 0 0   Wrist flexion 78 loose fist  62  78 and 72 loose fist  80 elbow to side decrease ext arm  80   Wrist extension 58  60 70 decrease ext arm  70   Wrist ulnar deviation        Wrist radial deviation        Wrist pronation 90    90 90  Wrist  supination 70  80 70 80 85 in session   (Blank rows = not tested)  Active ROM Right eval Left eval  Thumb MCP (0-60)    Thumb IP (0-80)    Thumb Radial abd/add (0-55)     Thumb Palmar abd/add (0-45)     Thumb Opposition to Small Finger     Index MCP (0-90)     Index PIP (0-100)     Index DIP (0-70)      Long MCP (0-90)      Long PIP (0-100)      Long DIP (0-70)      Ring MCP (0-90)      Ring PIP (0-100)      Ring DIP (0-70)      Little MCP (0-90)      Little PIP (0-100)      Little DIP (0-70)      Digits flexion and thumb range of motion within normal limits including opposition    HAND FUNCTION: Grip strength: Right: 35 extended arm 34  lbs; Left: 60 extended arm 56 lbs, Lateral pinch: Right: 9 lbs, Left: 12 lbs, and 3 point pinch: Right: 13 lbs, Left: 13 lbs Grip strength: Right: 51 extended arm 55  lbs; Left: 60 extended arm 56 lbs, Lateral pinch: Right: 9 lbs, Left: 12 lbs, and 3 point pinch: Right: 13 lbs, Left: 13 lbs 10/17/23 Grip strength: Right: 53 extended arm 56  lbs; Left: 60 extended arm 56 lbs, Lateral pinch: Right: 12 lbs, Left: 12 lbs, and 3 point pinch: Right: 14 lbs, Left: 13 lbs 11/19/23 Grip strength: Right: 58 extended arm 59  lbs; Left: 60 extended arm 56 lbs, Lateral pinch: Right: 12 lbs, Left: 12 lbs, and 3 point pinch: Right:  14 lbs, Left: 13 lbs 11/26/23 Grip strength: Right: 65 extended arm 67  lbs; Left: 55 extended arm 60 lbs, Lateral pinch: Right: 13 lbs, Left: 12 lbs, and 3 point pinch: Right: 15 lbs, Left: 11 lbs COORDINATION: Within functional limits  SENSATION: Denies any sensory issues  EDEMA: Will assess next session  COGNITION: Overall cognitive status: Within functional limits for tasks assessed      TREATMENT DATE: 11/26/23                                                                                                                             Patient arrived with reports of increased use with less discomfort. Patient's strength in wrist and forearm in all planes 5/5. Still lacking last 10 degrees of supination with elbow to side.  But easier to get there. Grip and prehension continue to improve.  See flowsheets. Patient's wrist flexion with extended elbow improved to same than left as well as wrist extension in pronation and neutral. Patient continues to be limited in supination with elbow extension at side. Not painful anymore but tight.  Modalities: Paraffin:  Time: 8 Location: Right elbow, forearm and wrist Supination stretch with elbow flexed  3 x 2 minutes    Soft tissue massage and mobilization to volar and dorsal forearm with Graston tool #4 for sweeping as well as myofascial release to radial forearm in combination with pronation supination  Joint mobilization done to radius head with elbow extended squeezing 1 pound weight with joint mobilization 10 reps prior   PROM for elbow extended forearm extensors with wrist flexion 5 reps 5 seconds at home exercises PROM supination and pronation with elbow flexed at 145 degrees with facilitation and maintaining supination into elbow flexion at 90 degrees 20 reps at home exercises Followed by passive range of motion and active assisted range of motion for supination with elbow flexed at 90 degrees at home exercise Followed by 2 pound  cuff weight around the hand for supination pronation supported 10 reps Patient reports fatigue more than pain. At this date also stain against the wall elbow extension with supination using 2 pound weight focusing on endrange/10 degrees.  No pain mostly tight or stiff. Pain decreased to to less than 1/10.  Mostly tightness Add to home exercises.        PATIENT EDUCATION: Education details: findings of eval and HEP  Person educated: Patient Education method: Explanation, Demonstration, Tactile cues, Verbal cues, and Handouts Education comprehension: verbalized understanding, returned demonstration, verbal cues required, and needs further education   GOALS: Goals reviewed with patient? Yes   LONG TERM GOALS: Target date: 8 wks  Patient to be independent in home program to increase elbow extension and supination by 10 to 20 degrees to hold small objects or medications in the palm and reach behind her symptom-free Baseline: Elbow extension -20 degrees.  Coming in and supination 70 degrees.  With pain with passive range of motion at the elbow but also into the bicep. Goal status: INITIAL  2.  Right wrist flexion and extension range of motion improved to within normal limits for patient to be able to reach behind her back.  Hearing aids in and apply deodorant symptom-free Baseline: Wrist extension 58 degrees flexion open hand 78 loose wrist 62. Goal status: INITIAL  3.  Pain in right elbow and forearm improved to less than 2/10 with yard work as well as bathing and dressing with upper body and grooming activities Baseline: Pain increased to 8/10 over the forearm as well as elbow into the upper arm. Goal status: INITIAL  4.  Right grip strength improved by 5 to 10 pounds for patient to be able to mow the lawn and drive and perform gripping activities and ADLs and IADLs symptom-free. Baseline: Grip 35 pounds on the right and 60 on the left.  Patient has increased pain 8/10 with anything  gripping or sustained gripping, mowing the lawn driving. Goal status: INITIAL  ASSESSMENT:  CLINICAL IMPRESSION: Patient seen for occupational therap for right forearm and elbow pain since had a traction injury rhe with walking her dog in January.  Patient had shoulder surgery about 4 years ago.  Per patient rotator cuff surgery.  At eval supination 70 degrees and elbow extension -20.  Patient report she was limited in this motion since shoulder surgery as well as had elbow pain after surgery.  Patient present at OT eval with decreased supination as well as wrist flexion extension as well as grip strength.  Patient pain can increase to 8/10 with repetitive or sustained gripping and lifting and pushing and pulling activities.  NOW patient made great progress from start of care and pain, elbow extension, wrist flexion extension  as well as supination.  Grip improving.  Increased functional use with less pain.  Motion mostly limited now by supination last 10 degrees as well as supination in combination with elbow extension to side.  Patient denies any pain mostly tight feeling now.  Patient to continue with home program for 2 weeks and follow-up.  Patient limited in functional use of right dominant hand in ADLs and IADLs.  Patient can benefit from skilled OT services to decrease pain increase motion and increase strength in right upper extremity to be able to be independent and symptom-free and ADLs and IADLs.  PERFORMANCE DEFICITS: in functional skills including ADLs, IADLs, ROM, strength, pain, flexibility, decreased knowledge of use of DME, and UE functional use,   and psychosocial skills including environmental adaptation and routines and behaviors.   IMPAIRMENTS: are limiting patient from ADLs, IADLs, rest and sleep, play, leisure, and social participation.   COMORBIDITIES: has no other co-morbidities that affects occupational performance. Patient will benefit from skilled OT to address above  impairments and improve overall function.  MODIFICATION OR ASSISTANCE TO COMPLETE EVALUATION:  modification of tasks or assist necessary to complete an evaluation.  OT OCCUPATIONAL PROFILE AND HISTORY: Problem focused assessment: Including review of records relating to presenting problem.  Reviewed history of shoulder surgeries as well as recovery and outcomes.  CLINICAL DECISION MAKING: Moderate-had shoulder surgery in the past,  task modification necessary  REHAB POTENTIAL: Good for goals  EVALUATION COMPLEXITY: Moderate      PLAN:  OT FREQUENCY: 1-2x/week  OT DURATION: 8 weeks  PLANNED INTERVENTIONS: 97168 OT Re-evaluation, 97535 self care/ADL training, 02889 therapeutic exercise, 97530 therapeutic activity, 97112 neuromuscular re-education, 97140 manual therapy, 97035 ultrasound, 97018 paraffin, 02960 fluidotherapy, 97034 contrast bath, 97033 iontophoresis, passive range of motion, patient/family education, and DME and/or AE instructions    CONSULTED AND AGREED WITH PLAN OF CARE: Patient     Ancel Peters, OTR/L,CLT 11/26/2023, 11:11 AM

## 2023-12-03 ENCOUNTER — Ambulatory Visit: Admitting: Occupational Therapy

## 2023-12-09 ENCOUNTER — Encounter: Admitting: Occupational Therapy

## 2023-12-10 ENCOUNTER — Ambulatory Visit: Admitting: Occupational Therapy

## 2023-12-10 DIAGNOSIS — M25521 Pain in right elbow: Secondary | ICD-10-CM | POA: Diagnosis not present

## 2023-12-10 DIAGNOSIS — M6281 Muscle weakness (generalized): Secondary | ICD-10-CM

## 2023-12-10 DIAGNOSIS — M25631 Stiffness of right wrist, not elsewhere classified: Secondary | ICD-10-CM | POA: Diagnosis not present

## 2023-12-10 DIAGNOSIS — M79631 Pain in right forearm: Secondary | ICD-10-CM

## 2023-12-10 DIAGNOSIS — M25621 Stiffness of right elbow, not elsewhere classified: Secondary | ICD-10-CM

## 2023-12-10 NOTE — Therapy (Signed)
 OUTPATIENT OCCUPATIONAL THERAPY ORTHO TREATMENT/RECERT  Patient Name: Sheila Rivas MRN: [id number] DOB:22-Mar-1951, 73 y.o., female Today's Date: 12/10/2023  PCP: Don NP REFERRING PROVIDER: [name]  END OF SESSION:  OT End of Session - 12/10/23 0821     Visit Number 9    Number of Visits 12    Date for OT Re-Evaluation 01/28/24    OT Start Time 0821    OT Stop Time 0902    OT Time Calculation (min) 41 min    Activity Tolerance Patient tolerated treatment well    Behavior During Therapy [facility] for tasks assessed/performed          Past Medical History:  Diagnosis Date   Anemia    with pregnancy   Anxiety    Arthritis    osteo - hips, knees, lower back   Asthma    well controlled   Complication of anesthesia    Respirations dropped after shoulder surgery in 2013. spent the night   DDD (degenerative disc disease), lumbar    L4-L5   GERD (gastroesophageal reflux disease)    Hypertension    Motion sickness    [employer] theaters   Osteoarthritis, hip, bilateral    Sleep apnea    no CPAP/ not tolerated   Past Surgical History:  Procedure Laterality Date   ANTERIOR LATERAL LUMBAR FUSION WITH PERCUTANEOUS SCREW 1 LEVEL N/A 03/01/2021   Procedure: L4-5 LATERAL INTERBODY FUSION, L4-5 POSTERIOR FUSION;  Surgeon: Clois Fret, MD;  Location: [facility];  Service: Neurosurgery;  Laterality: N/A;   COLONOSCOPY  2016   COLONOSCOPY WITH PROPOFOL  N/A 10/02/2021   Procedure: COLONOSCOPY WITH PROPOFOL ;  Surgeon: Maryruth Ole DASEN, MD;  Location: [facility];  Service: Endoscopy;  Laterality: N/A;   SHOULDER ARTHROSCOPY WITH ROTATOR CUFF REPAIR Right 2013   SHOULDER ARTHROSCOPY WITH SUBACROMIAL DECOMPRESSION Right 01/09/2019   TOTAL HIP ARTHROPLASTY Left 01/20/2020   Procedure: TOTAL HIP ARTHROPLASTY;  Surgeon: Mardee Lynwood SQUIBB, MD;  Location: [facility];  Service: Orthopedics;  Laterality: Left;   TOTAL HIP ARTHROPLASTY Right 08/01/2020   Procedure: TOTAL HIP ARTHROPLASTY;   Surgeon: Mardee Lynwood SQUIBB, MD;  Location: [facility];  Service: Orthopedics;  Laterality: Right;   Patient Active Problem List   Diagnosis Date Noted   S/P lumbar fusion 03/01/2021   H/O total hip arthroplasty 08/01/2020   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   Hypertension 01/19/2020   Mild intermittent asthma 01/19/2020   Obesity (BMI 30-39.9) 01/19/2020   Chronic bilateral low back pain with left-sided sciatica 07/27/2019   Foraminal stenosis of lumbar region 07/27/2019   Osteoarthritis of hips, bilateral 10/21/2017   DDD (degenerative disc disease), lumbar 04/23/2017    ONSET DATE: Jan 25  REFERRING DIAG: R elbow and forearm pain  THERAPY DIAG:  Pain in right elbow  Pain in right forearm  Stiffness of right wrist, not elsewhere classified  Stiffness of right elbow, not elsewhere classified  Muscle weakness (generalized)  Rationale for Evaluation and Treatment: Rehabilitation  SUBJECTIVE:   SUBJECTIVE STATEMENT: I feel I come back to where I was before I hurt it when the dog pulled my arm.  Do not have pain.  I still have to focus to straighten my elbow this patient in the morning.  With the rotational of my forearm with my palm up if we making progress I would like to continue because I did not get that back after my shoulder surgery.  I am just happy I can straighten my elbow  now that I could not also do's incision Pt accompanied by: self  PERTINENT HISTORY: [name] 08/30/23 Assessment:  Clemens walking her dog 3-4 months prior to appt  Order send 09/13/23  Encounter Diagnoses  Name Primary?  Acute pain of right shoulder  Neck pain  Right elbow pain Yes  Cervical spondylosis  Status post right rotator cuff repair  Rotator cuff tendinitis, right  Strain of right forearm, initial encounter  Sprain of right elbow, initial encounter   Plan:  The treatment options were discussed with the patient. In addition, patient educational materials were provided  regarding the diagnosis and treatment options. Although the patient does have moderate degenerative changes of her shoulder, I do not feel that majority of her forearm symptoms are actually coming from her shoulder. Furthermore, although she has significant degenerative changes of her neck, she has no pain with neck motion and no obvious nerve deficits to her right upper extremity. Therefore, I feel that the majority of her symptoms are coming from her elbow and forearm. The patient is quite frustrated by her symptoms and functional limitations, but is hesitant to consider more aggressive treatment options. Therefore I have recommended that she be referred to occupational therapy for range of motion and strength exercises as well as modalities to reduce inflammation. She may continue with her normal daily activities but is to avoid offending activities. She may take over-the-counter medications as needed for discomfort. All of the patient's questions and concerns were answered. She can call any time with further concerns. She will follow up on an as necessary basis per her request. This office visit took 60 minutes, of which >50% involved patient counseling/education.   PRECAUTIONS: None   WEIGHT BEARING RESTRICTIONS: No  PAIN:  Are you having pain?  No pain  FALLS: Has patient fallen in last 6 months? No  LIVING ENVIRONMENT: Lives with: lives with their family  PLOF: Lives with family.  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  She works in the yard, Airline pilot.  Do some YouTube workouts and help with her 39-year-old grand daughter 40 lbs dog  PATIENT GOALS: I want the pain better my right forearm and elbow so that I can work in the yard, do things around the house, do my workout videos,    OBJECTIVE:  Note: Objective measures were completed at Evaluation unless otherwise noted.  HAND DOMINANCE: Right  ADLs: Increased pain with gripping and pulling and  lifting and pushing and any sustained grip pain increases to 8/10   FUNCTIONAL OUTCOME MEASURES: To be done next session  UPPER EXTREMITY ROM:     Active ROM Right eval Left eval R 10/11/23 R 10/14/23 R 11/08/23 R 11/26/23 R 12/10/23  Shoulder flexion         Shoulder abduction         Shoulder adduction         Shoulder extension         Shoulder internal rotation         Shoulder external rotation         Elbow flexion     150    Elbow extension -20  0 0 0    Wrist flexion 78 loose fist  62  78 and 72 loose fist  80 elbow to side decrease ext arm  80    Wrist extension 58  60 70 decrease ext arm  70    Wrist ulnar deviation  Wrist radial deviation         Wrist pronation 90    90 90 90  Wrist supination 70  80 70 80 85 in session  80 and place and hold 85, PROM 90   (Blank rows = not tested)  Active ROM Right eval Left eval  Thumb MCP (0-60)    Thumb IP (0-80)    Thumb Radial abd/add (0-55)     Thumb Palmar abd/add (0-45)     Thumb Opposition to Small Finger     Index MCP (0-90)     Index PIP (0-100)     Index DIP (0-70)      Long MCP (0-90)      Long PIP (0-100)      Long DIP (0-70)      Ring MCP (0-90)      Ring PIP (0-100)      Ring DIP (0-70)      Little MCP (0-90)      Little PIP (0-100)      Little DIP (0-70)      Digits flexion and thumb range of motion within normal limits including opposition    HAND FUNCTION: Grip strength: Right: 35 extended arm 34  lbs; Left: 60 extended arm 56 lbs, Lateral pinch: Right: 9 lbs, Left: 12 lbs, and 3 point pinch: Right: 13 lbs, Left: 13 lbs Grip strength: Right: 51 extended arm 55  lbs; Left: 60 extended arm 56 lbs, Lateral pinch: Right: 9 lbs, Left: 12 lbs, and 3 point pinch: Right: 13 lbs, Left: 13 lbs 10/17/23 Grip strength: Right: 53 extended arm 56  lbs; Left: 60 extended arm 56 lbs, Lateral pinch: Right: 12 lbs, Left: 12 lbs, and 3 point pinch: Right: 14 lbs, Left: 13 lbs 11/19/23 Grip strength: Right: 58  extended arm 59  lbs; Left: 60 extended arm 56 lbs, Lateral pinch: Right: 12 lbs, Left: 12 lbs, and 3 point pinch: Right: 14 lbs, Left: 13 lbs 11/26/23 Grip strength: Right: 65 extended arm 67  lbs; Left: 55 extended arm 60 lbs, Lateral pinch: Right: 13 lbs, Left: 12 lbs, and 3 point pinch: Right: 15 lbs, Left: 11 lbs 12/10/23 Grip strength: Right: 65 extended arm 67  lbs; Left: 60 extended arm 54 lbs, Lateral pinch: Right: 15 lbs, Left: 12 lbs, and 3 point pinch: Right: 15 lbs, Left: 11 lbs COORDINATION: Within functional limits  SENSATION: Denies any sensory issues  EDEMA: Will assess next session  COGNITION: Overall cognitive status: Within functional limits for tasks assessed      TREATMENT DATE: 12/10/23                                                                                                                             Patient arrived with reports of increased use of right upper extremity.  Since last time pain-free use.   Patient regained elbow extension that she did not even had after shoulder surgery.  Have to  still focus on maintaining elbow extension especially in the morning.   Patient's strength in wrist and forearm in all active range of motion plane 5/5  still lacking last 5 to 10 degrees of supination with elbow to side.  But easier to get there. Less pain with passive range of motion to 90 degrees and able to place and hold at 85 degrees. Grip and prehension showed fantastic improvement see flowsheet  patient's wrist flexion with extended elbow improved to same than left as well as wrist extension in pronation and neutral. Patient continues to be limited in supination with elbow extension at side. Not painful anymore but tight.      Reviewed with patient home program to continue with for 4 weeks.  Patient to Work on moist heat to elbow and forearm prior to doing passive range of motion for supination with elbow to side to 90 degrees Followed by placing  hold. Followed by recommending her getting 5 pound flex bar to address supination strengthening and endrange.  Practiced with patient as well as information provided to get on [facility] Patient can continue to address daily strengthening for wrist flexion extension as well as radial ulnar deviation using the yellow flex bar of 5 pounds  Patient wants to return back to the gym reviewed with patient doing row machine at 20 pounds Tricep bar 10 to 12 pounds Chest press recommended with red Thera-Band 2 sets of 12 Pulldown bar on Biodex 15 to 20 pounds 12-15 reps All of the above 3 times a week  In for warm up at the gym she can do arm bike or NuStep that involves upper extremity Patient done 8 minutes All pain-free.  Patient can follow-up in a month.      PATIENT EDUCATION: Education details: findings of eval and HEP  Person educated: Patient Education method: Explanation, Demonstration, Tactile cues, Verbal cues, and Handouts Education comprehension: verbalized understanding, returned demonstration, verbal cues required, and needs further education   GOALS: Goals reviewed with patient? Yes   LONG TERM GOALS: Target date: 8 wks  Patient to be independent in home program to increase elbow extension and supination by 10 to 20 degrees to hold small objects or medications in the palm and reach behind her symptom-free Baseline: Elbow extension -20 degrees.  Coming in and supination 70 degrees.  With pain with passive range of motion at the elbow but also into the bicep. NOW elbow improved to full extension and strength increased pain-free, supination 80 degrees passive range of motion 90 degrees with a placing hold at 85 with less pain Goal status: Progressing  2.  Right wrist flexion and extension range of motion improved to within normal limits for patient to be able to reach behind her back.  Hearing aids in and apply deodorant symptom-free Baseline: Wrist extension 58 degrees flexion open  hand 78 loose wrist 62. Goal status: Met  3.  Pain in right elbow and forearm improved to less than 2/10 with yard work as well as bathing and dressing with upper body and grooming activities Baseline: Pain increased to 8/10 over the forearm as well as elbow into the upper arm. Goal status: Met  4.  Right grip strength improved by 5 to 10 pounds for patient to be able to mow the lawn and drive and perform gripping activities and ADLs and IADLs symptom-free. Baseline: Grip 35 pounds on the right and 60 on the left.  Patient has increased pain 8/10 with anything gripping or sustained gripping,  mowing the lawn driving. Goal status: Met  ASSESSMENT:  CLINICAL IMPRESSION: Patient seen for occupational therap for right forearm and elbow pain since had a traction injury  with walking her dog in January.  Patient had shoulder surgery about 4 years ago.  Per patient rotator cuff surgery.  At eval supination 70 degrees and elbow extension -20.  Patient report she was limited in this motion since shoulder surgery as well as had elbow pain after surgery.  Patient present at OT eval with decreased supination as well as wrist flexion extension as well as grip strength.  Patient pain can increase to 8/10 with repetitive or sustained gripping and lifting and pushing and pulling activities.  NOW patient made fantastic progress with reports of no pain in wrist and forearm with functional use.  Patient with normal wrist range of motion and strength.  Patient also has full elbow extension and flexion now.  Patient may progress in supination.  But still lack 10 degrees.  But is pain-free with passive range of motion to 90 degrees for supination.  Grip and prehension strength improved fantastic.  Patient's home program was upgraded to address last 10 degrees of supination as well as strengthening as well as transition to community gym working out.  Patient to follow-up in 4 weeks.  Patient limited in functional use of right  dominant hand in ADLs and IADLs.  Patient can benefit from skilled OT services to decrease pain increase motion and increase strength in right upper extremity to be able to be independent and symptom-free and ADLs and IADLs.  PERFORMANCE DEFICITS: in functional skills including ADLs, IADLs, ROM, strength, pain, flexibility, decreased knowledge of use of DME, and UE functional use,   and psychosocial skills including environmental adaptation and routines and behaviors.   IMPAIRMENTS: are limiting patient from ADLs, IADLs, rest and sleep, play, leisure, and social participation.   COMORBIDITIES: has no other co-morbidities that affects occupational performance. Patient will benefit from skilled OT to address above impairments and improve overall function.  MODIFICATION OR ASSISTANCE TO COMPLETE EVALUATION:  modification of tasks or assist necessary to complete an evaluation.  OT OCCUPATIONAL PROFILE AND HISTORY: Problem focused assessment: Including review of records relating to presenting problem.  Reviewed history of shoulder surgeries as well as recovery and outcomes.  CLINICAL DECISION MAKING: Moderate-had shoulder surgery in the past,  task modification necessary  REHAB POTENTIAL: Good for goals  EVALUATION COMPLEXITY: Moderate      PLAN:  OT FREQUENCY: 1-2 visits   OT DURATION: 8 weeks  PLANNED INTERVENTIONS: 97168 OT Re-evaluation, 97535 self care/ADL training, 02889 therapeutic exercise, 97530 therapeutic activity, 97112 neuromuscular re-education, 97140 manual therapy, 97035 ultrasound, 97018 paraffin, 02960 fluidotherapy, 97034 contrast bath, 97033 iontophoresis, passive range of motion, patient/family education, and DME and/or AE instructions    CONSULTED AND AGREED WITH PLAN OF CARE: Patient     Ancel Peters, OTR/L,CLT 12/10/2023, 12:57 PM

## 2023-12-16 ENCOUNTER — Encounter: Admitting: Occupational Therapy

## 2024-01-06 ENCOUNTER — Ambulatory Visit: Attending: Surgery | Admitting: Occupational Therapy

## 2024-01-06 DIAGNOSIS — M25621 Stiffness of right elbow, not elsewhere classified: Secondary | ICD-10-CM | POA: Diagnosis not present

## 2024-01-06 DIAGNOSIS — M6281 Muscle weakness (generalized): Secondary | ICD-10-CM | POA: Diagnosis not present

## 2024-01-06 DIAGNOSIS — M79631 Pain in right forearm: Secondary | ICD-10-CM | POA: Insufficient documentation

## 2024-01-06 DIAGNOSIS — M25521 Pain in right elbow: Secondary | ICD-10-CM | POA: Diagnosis not present

## 2024-01-06 DIAGNOSIS — M25631 Stiffness of right wrist, not elsewhere classified: Secondary | ICD-10-CM | POA: Insufficient documentation

## 2024-01-06 NOTE — Therapy (Signed)
 OUTPATIENT OCCUPATIONAL THERAPY ORTHO TREATMENT/10th visit  Patient Name: Sheila Rivas MRN: [id number] DOB:May 23, 1950, 73 y.o., female Today's Date: 01/06/2024  PCP: Don NP REFERRING PROVIDER: [name]  END OF SESSION:  OT End of Session - 01/06/24 0947     Visit Number 10    Number of Visits 12    Date for OT Re-Evaluation 01/28/24    OT Start Time 0947    OT Stop Time 1011    OT Time Calculation (min) 24 min    Activity Tolerance Patient tolerated treatment well    Behavior During Therapy [facility] for tasks assessed/performed          Past Medical History:  Diagnosis Date   Anemia    with pregnancy   Anxiety    Arthritis    osteo - hips, knees, lower back   Asthma    well controlled   Complication of anesthesia    Respirations dropped after shoulder surgery in 2013. spent the night   DDD (degenerative disc disease), lumbar    L4-L5   GERD (gastroesophageal reflux disease)    Hypertension    Motion sickness    [employer] theaters   Osteoarthritis, hip, bilateral    Sleep apnea    no CPAP/ not tolerated   Past Surgical History:  Procedure Laterality Date   ANTERIOR LATERAL LUMBAR FUSION WITH PERCUTANEOUS SCREW 1 LEVEL N/A 03/01/2021   Procedure: L4-5 LATERAL INTERBODY FUSION, L4-5 POSTERIOR FUSION;  Surgeon: Clois Fret, MD;  Location: [facility];  Service: Neurosurgery;  Laterality: N/A;   COLONOSCOPY  2016   COLONOSCOPY WITH PROPOFOL  N/A 10/02/2021   Procedure: COLONOSCOPY WITH PROPOFOL ;  Surgeon: Maryruth Ole DASEN, MD;  Location: [facility];  Service: Endoscopy;  Laterality: N/A;   SHOULDER ARTHROSCOPY WITH ROTATOR CUFF REPAIR Right 2013   SHOULDER ARTHROSCOPY WITH SUBACROMIAL DECOMPRESSION Right 01/09/2019   TOTAL HIP ARTHROPLASTY Left 01/20/2020   Procedure: TOTAL HIP ARTHROPLASTY;  Surgeon: Mardee Lynwood SQUIBB, MD;  Location: [facility];  Service: Orthopedics;  Laterality: Left;   TOTAL HIP ARTHROPLASTY Right 08/01/2020   Procedure: TOTAL HIP  ARTHROPLASTY;  Surgeon: Mardee Lynwood SQUIBB, MD;  Location: [facility];  Service: Orthopedics;  Laterality: Right;   Patient Active Problem List   Diagnosis Date Noted   S/P lumbar fusion 03/01/2021   H/O total hip arthroplasty 08/01/2020   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   Hypertension 01/19/2020   Mild intermittent asthma 01/19/2020   Obesity (BMI 30-39.9) 01/19/2020   Chronic bilateral low back pain with left-sided sciatica 07/27/2019   Foraminal stenosis of lumbar region 07/27/2019   Osteoarthritis of hips, bilateral 10/21/2017   DDD (degenerative disc disease), lumbar 04/23/2017    ONSET DATE: Jan 25  REFERRING DIAG: R elbow and forearm pain  THERAPY DIAG:  Pain in right elbow  Pain in right forearm  Stiffness of right wrist, not elsewhere classified  Stiffness of right elbow, not elsewhere classified  Muscle weakness (generalized)  Rationale for Evaluation and Treatment: Rehabilitation  SUBJECTIVE:   SUBJECTIVE STATEMENT: I am so much better than when I started - I can get soap in my palm now from the dispenser and crumbs in my palm - strength better- going to the gym  Pt accompanied by: self  PERTINENT HISTORY: [name] 08/30/23 Assessment:  Clemens walking her dog 3-4 months prior to appt  Order send 09/13/23  Encounter Diagnoses  Name Primary?  Acute pain of right shoulder  Neck pain  Right elbow pain Yes  Cervical spondylosis  Status post right rotator cuff repair  Rotator cuff tendinitis, right  Strain of right forearm, initial encounter  Sprain of right elbow, initial encounter   Plan:  The treatment options were discussed with the patient. In addition, patient educational materials were provided regarding the diagnosis and treatment options. Although the patient does have moderate degenerative changes of her shoulder, I do not feel that majority of her forearm symptoms are actually coming from her shoulder. Furthermore, although she has  significant degenerative changes of her neck, she has no pain with neck motion and no obvious nerve deficits to her right upper extremity. Therefore, I feel that the majority of her symptoms are coming from her elbow and forearm. The patient is quite frustrated by her symptoms and functional limitations, but is hesitant to consider more aggressive treatment options. Therefore I have recommended that she be referred to occupational therapy for range of motion and strength exercises as well as modalities to reduce inflammation. She may continue with her normal daily activities but is to avoid offending activities. She may take over-the-counter medications as needed for discomfort. All of the patient's questions and concerns were answered. She can call any time with further concerns. She will follow up on an as necessary basis per her request. This office visit took 60 minutes, of which >50% involved patient counseling/education.   PRECAUTIONS: None   WEIGHT BEARING RESTRICTIONS: No  PAIN:  Are you having pain?  No pain  FALLS: Has patient fallen in last 6 months? No  LIVING ENVIRONMENT: Lives with: lives with their family  PLOF: Lives with family.  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  She works in the yard, Airline pilot.  Do some YouTube workouts and help with her 32-year-old grand daughter 40 lbs dog  PATIENT GOALS: I want the pain better my right forearm and elbow so that I can work in the yard, do things around the house, do my workout videos,    OBJECTIVE:  Note: Objective measures were completed at Evaluation unless otherwise noted.  HAND DOMINANCE: Right  ADLs: Increased pain with gripping and pulling and lifting and pushing and any sustained grip pain increases to 8/10   FUNCTIONAL OUTCOME MEASURES: To be done next session  UPPER EXTREMITY ROM:     Active ROM Right eval Left eval R 10/11/23 R 10/14/23 R 11/08/23 R 11/26/23 R 12/10/23   Shoulder flexion         Shoulder abduction         Shoulder adduction         Shoulder extension         Shoulder internal rotation         Shoulder external rotation         Elbow flexion     150    Elbow extension -20  0 0 0    Wrist flexion 78 loose fist  62  78 and 72 loose fist  80 elbow to side decrease ext arm  80    Wrist extension 58  60 70 decrease ext arm  70    Wrist ulnar deviation         Wrist radial deviation         Wrist pronation 90    90 90 90  Wrist supination 70  80 70 80 85 in session  80 and place and hold 85, PROM 90   (Blank rows = not tested)  Active ROM Right eval Left eval  Thumb MCP (0-60)    Thumb IP (0-80)    Thumb Radial abd/add (0-55)     Thumb Palmar abd/add (0-45)     Thumb Opposition to Small Finger     Index MCP (0-90)     Index PIP (0-100)     Index DIP (0-70)      Long MCP (0-90)      Long PIP (0-100)      Long DIP (0-70)      Ring MCP (0-90)      Ring PIP (0-100)      Ring DIP (0-70)      Little MCP (0-90)      Little PIP (0-100)      Little DIP (0-70)      Digits flexion and thumb range of motion within normal limits including opposition    HAND FUNCTION: Grip strength: Right: 35 extended arm 34  lbs; Left: 60 extended arm 56 lbs, Lateral pinch: Right: 9 lbs, Left: 12 lbs, and 3 point pinch: Right: 13 lbs, Left: 13 lbs Grip strength: Right: 51 extended arm 55  lbs; Left: 60 extended arm 56 lbs, Lateral pinch: Right: 9 lbs, Left: 12 lbs, and 3 point pinch: Right: 13 lbs, Left: 13 lbs 10/17/23 Grip strength: Right: 53 extended arm 56  lbs; Left: 60 extended arm 56 lbs, Lateral pinch: Right: 12 lbs, Left: 12 lbs, and 3 point pinch: Right: 14 lbs, Left: 13 lbs 11/19/23 Grip strength: Right: 58 extended arm 59  lbs; Left: 60 extended arm 56 lbs, Lateral pinch: Right: 12 lbs, Left: 12 lbs, and 3 point pinch: Right: 14 lbs, Left: 13 lbs 11/26/23 Grip strength: Right: 65 extended arm 67  lbs; Left: 55 extended arm 60 lbs, Lateral pinch:  Right: 13 lbs, Left: 12 lbs, and 3 point pinch: Right: 15 lbs, Left: 11 lbs 12/10/23 Grip strength: Right: 65 extended arm 67  lbs; Left: 60 extended arm 54 lbs, Lateral pinch: Right: 15 lbs, Left: 12 lbs, and 3 point pinch: Right: 15 lbs, Left: 11 lbs 01/06/24 Grip strength: Right: 67 extended arm 67  lbs; Left: 58 extended arm 51 lbs, Lateral pinch: Right: 16 lbs, Left: 12 lbs, and 3 point pinch: Right: 18 lbs, Left: 14 lbs COORDINATION: Within functional limits  SENSATION: Denies any sensory issues  EDEMA: Will assess next session  COGNITION: Overall cognitive status: Within functional limits for tasks assessed      TREATMENT DATE: 01/06/24            Patient arrived after doing home exercises for a month.  Patient also returned to the gym working out.  After reviewing with patient last session weights and exercises that she can continue with in the community gym                                                                                                                 Patient arrived with reports of increased use of right upper extremity.  Continues  to be pain-free Patient regained elbow extension that she did not even had after shoulder surgery.   Patient's strength in wrist and forearm in all active range of motion plane 5/5  still lacking last 10 degrees of supination with elbow to side.  But easier to get there. Passive range of motion 90 degrees Patient made great progress in grip and prehension strength from start of care.  Continued to show progress and prehension strength since last visit.  Reviewed with patient again home exercises-recommend to continue with moist heat to elbow and forearm prior to doing passive range of motion for supination with elbow to side to 90 degrees Reviewed with patient again using 5 pound flex bar to address supination strengthening and endrange.  Patient was gripping with thumb and opposition.  Recommended and done with patient keeping thumb  in adduction able to get full supination.  Reviewed with patient also to use flex bar for strengthening for  wrist flexion extension as well as radial ulnar deviation  3 times a week.  Pain-free.   Patient has been going to the gym.     PATIENT EDUCATION: Education details: findings of eval and HEP  Person educated: Patient Education method: Explanation, Demonstration, Tactile cues, Verbal cues, and Handouts Education comprehension: verbalized understanding, returned demonstration, verbal cues required, and needs further education   GOALS: Goals reviewed with patient? Yes   LONG TERM GOALS: Target date: 8 wks  Patient to be independent in home program to increase elbow extension and supination by 10 to 20 degrees to hold small objects or medications in the palm and reach behind her symptom-free Baseline: Elbow extension -20 degrees.  Coming in and supination 70 degrees.  With pain with passive range of motion at the elbow but also into the bicep. NOW elbow improved to full extension and strength increased pain-free, supination 80 degrees passive range of motion 90 degrees with a placing hold at 85 with less pain Goal status: Met  2.  Right wrist flexion and extension range of motion improved to within normal limits for patient to be able to reach behind her back.  Hearing aids in and apply deodorant symptom-free Baseline: Wrist extension 58 degrees flexion open hand 78 loose wrist 62. Goal status: Met  3.  Pain in right elbow and forearm improved to less than 2/10 with yard work as well as bathing and dressing with upper body and grooming activities Baseline: Pain increased to 8/10 over the forearm as well as elbow into the upper arm. Goal status: Met  4.  Right grip strength improved by 5 to 10 pounds for patient to be able to mow the lawn and drive and perform gripping activities and ADLs and IADLs symptom-free. Baseline: Grip 35 pounds on the right and 60 on the left.  Patient  has increased pain 8/10 with anything gripping or sustained gripping, mowing the lawn driving. Goal status: Met  ASSESSMENT:  CLINICAL IMPRESSION: Patient seen for occupational therap for right forearm and elbow pain since had a traction injury  with walking her dog in January.  Patient had shoulder surgery about 4 years ago.  Per patient rotator cuff surgery.  At eval supination 70 degrees and elbow extension -20.  Patient report she was limited in this motion since shoulder surgery as well as had elbow pain after surgery.  Patient present at OT eval with decreased supination as well as wrist flexion extension as well as grip strength.  Patient pain can increase to 8/10 with  repetitive or sustained gripping and lifting and pushing and pulling activities.  NOW patient made great progress from start of care.  With able to using her right upper extremity pain-free.  Patient gain full elbow extension as well as wrist motion pain-free in all planes.  Strength in range for wrist and forearm 5/5.  Patient continues to be limited in the last 10 degrees of supination.  But can get passive range of motion to 90.  Patient has a 5 pound flex bar that she can continue to use for forearm and wrist.  Patient had returned to the gym working out.  And grip and prehension strength improved greatly right hand stronger than left now.  Patient agreed to be discharged from OT continue on at community gym and home program. PERFORMANCE DEFICITS: in functional skills including ADLs, IADLs, ROM, strength, pain, flexibility, decreased knowledge of use of DME, and UE functional use,   and psychosocial skills including environmental adaptation and routines and behaviors.   IMPAIRMENTS: are limiting patient from ADLs, IADLs, rest and sleep, play, leisure, and social participation.   COMORBIDITIES: has no other co-morbidities that affects occupational performance. Patient will benefit from skilled OT to address above impairments and  improve overall function.  MODIFICATION OR ASSISTANCE TO COMPLETE EVALUATION:  modification of tasks or assist necessary to complete an evaluation.  OT OCCUPATIONAL PROFILE AND HISTORY: Problem focused assessment: Including review of records relating to presenting problem.  Reviewed history of shoulder surgeries as well as recovery and outcomes.  CLINICAL DECISION MAKING: Moderate-had shoulder surgery in the past,  task modification necessary  REHAB POTENTIAL: Good for goals  EVALUATION COMPLEXITY: Moderate      PLAN:  OT FREQUENCY: 1-2 visits   OT DURATION: 8 weeks  PLANNED INTERVENTIONS: 97168 OT Re-evaluation, 97535 self care/ADL training, 02889 therapeutic exercise, 97530 therapeutic activity, 97112 neuromuscular re-education, 97140 manual therapy, 97035 ultrasound, 97018 paraffin, 02960 fluidotherapy, 97034 contrast bath, 97033 iontophoresis, passive range of motion, patient/family education, and DME and/or AE instructions    CONSULTED AND AGREED WITH PLAN OF CARE: Patient     Ancel Peters, OTR/L,CLT 01/06/2024, 10:12 AM

## 2024-01-09 DIAGNOSIS — I10 Essential (primary) hypertension: Secondary | ICD-10-CM | POA: Diagnosis not present

## 2024-01-09 DIAGNOSIS — M51379 Other intervertebral disc degeneration, lumbosacral region without mention of lumbar back pain or lower extremity pain: Secondary | ICD-10-CM | POA: Diagnosis not present

## 2024-01-09 DIAGNOSIS — E785 Hyperlipidemia, unspecified: Secondary | ICD-10-CM | POA: Diagnosis not present

## 2024-01-09 DIAGNOSIS — M160 Bilateral primary osteoarthritis of hip: Secondary | ICD-10-CM | POA: Diagnosis not present

## 2024-01-09 DIAGNOSIS — J45909 Unspecified asthma, uncomplicated: Secondary | ICD-10-CM | POA: Diagnosis not present

## 2024-01-09 DIAGNOSIS — Z1331 Encounter for screening for depression: Secondary | ICD-10-CM | POA: Diagnosis not present

## 2024-01-09 DIAGNOSIS — Z79899 Other long term (current) drug therapy: Secondary | ICD-10-CM | POA: Diagnosis not present

## 2024-01-09 DIAGNOSIS — E669 Obesity, unspecified: Secondary | ICD-10-CM | POA: Diagnosis not present

## 2024-01-09 DIAGNOSIS — Z0000 Encounter for general adult medical examination without abnormal findings: Secondary | ICD-10-CM | POA: Diagnosis not present

## 2024-01-09 DIAGNOSIS — R7302 Impaired glucose tolerance (oral): Secondary | ICD-10-CM | POA: Diagnosis not present

## 2024-04-10 NOTE — Progress Notes (Signed)
 Sheila Rivas                                          MRN: [id number]   04/10/2024   The [facility] Quality Team Specialist reviewed this patient medical record for the purposes of chart review for care gap closure. The following were reviewed: chart review for care gap closure-controlling blood pressure.    [facility] Quality Team
